# Patient Record
Sex: Female | Born: 1955 | Race: Black or African American | Hispanic: No | Smoking: Never smoker
Health system: Southern US, Community
[De-identification: ages and names within clinical notes are randomized; demographics above are authoritative.]

## PROBLEM LIST (undated history)

## (undated) DIAGNOSIS — D759 Disease of blood and blood-forming organs, unspecified: Secondary | ICD-10-CM

## (undated) DIAGNOSIS — E876 Hypokalemia: Secondary | ICD-10-CM

## (undated) DIAGNOSIS — Z8489 Family history of other specified conditions: Secondary | ICD-10-CM

## (undated) DIAGNOSIS — F329 Major depressive disorder, single episode, unspecified: Secondary | ICD-10-CM

## (undated) DIAGNOSIS — F0390 Unspecified dementia without behavioral disturbance: Secondary | ICD-10-CM

## (undated) DIAGNOSIS — E8881 Metabolic syndrome: Secondary | ICD-10-CM

## (undated) DIAGNOSIS — F32A Depression, unspecified: Secondary | ICD-10-CM

## (undated) DIAGNOSIS — M792 Neuralgia and neuritis, unspecified: Secondary | ICD-10-CM

## (undated) DIAGNOSIS — G35 Multiple sclerosis: Secondary | ICD-10-CM

## (undated) DIAGNOSIS — I10 Essential (primary) hypertension: Secondary | ICD-10-CM

## (undated) HISTORY — DX: Unspecified dementia, unspecified severity, without behavioral disturbance, psychotic disturbance, mood disturbance, and anxiety: F03.90

## (undated) HISTORY — PX: ABDOMINAL HYSTERECTOMY: SHX81

---

## 1997-12-23 ENCOUNTER — Ambulatory Visit (HOSPITAL_BASED_OUTPATIENT_CLINIC_OR_DEPARTMENT_OTHER): Admission: RE | Admit: 1997-12-23 | Discharge: 1997-12-23 | Payer: Self-pay | Admitting: Specialist

## 1999-04-29 ENCOUNTER — Encounter: Payer: Self-pay | Admitting: Family Medicine

## 1999-04-29 ENCOUNTER — Encounter: Admission: RE | Admit: 1999-04-29 | Discharge: 1999-04-29 | Payer: Self-pay | Admitting: Family Medicine

## 1999-05-08 ENCOUNTER — Encounter: Admission: RE | Admit: 1999-05-08 | Discharge: 1999-05-08 | Payer: Self-pay | Admitting: Family Medicine

## 1999-05-08 ENCOUNTER — Encounter: Payer: Self-pay | Admitting: Family Medicine

## 2000-05-09 ENCOUNTER — Encounter: Payer: Self-pay | Admitting: Emergency Medicine

## 2000-05-10 ENCOUNTER — Encounter: Payer: Self-pay | Admitting: Emergency Medicine

## 2000-05-10 ENCOUNTER — Observation Stay (HOSPITAL_COMMUNITY): Admission: EM | Admit: 2000-05-10 | Discharge: 2000-05-11 | Payer: Self-pay | Admitting: *Deleted

## 2001-06-19 ENCOUNTER — Encounter: Admission: RE | Admit: 2001-06-19 | Discharge: 2001-06-19 | Payer: Self-pay | Admitting: Orthopedic Surgery

## 2001-06-19 ENCOUNTER — Encounter: Payer: Self-pay | Admitting: Orthopedic Surgery

## 2001-07-10 ENCOUNTER — Encounter: Payer: Self-pay | Admitting: Orthopedic Surgery

## 2001-07-10 ENCOUNTER — Encounter: Admission: RE | Admit: 2001-07-10 | Discharge: 2001-07-10 | Payer: Self-pay | Admitting: Orthopedic Surgery

## 2001-07-24 ENCOUNTER — Encounter: Payer: Self-pay | Admitting: Orthopedic Surgery

## 2001-07-24 ENCOUNTER — Encounter: Admission: RE | Admit: 2001-07-24 | Discharge: 2001-07-24 | Payer: Self-pay | Admitting: Orthopedic Surgery

## 2004-06-15 ENCOUNTER — Ambulatory Visit (HOSPITAL_COMMUNITY): Admission: RE | Admit: 2004-06-15 | Discharge: 2004-06-15 | Payer: Self-pay | Admitting: Neurology

## 2006-03-22 ENCOUNTER — Encounter: Admission: RE | Admit: 2006-03-22 | Discharge: 2006-03-22 | Payer: Self-pay | Admitting: Family Medicine

## 2006-12-13 ENCOUNTER — Inpatient Hospital Stay (HOSPITAL_COMMUNITY): Admission: EM | Admit: 2006-12-13 | Discharge: 2006-12-17 | Payer: Self-pay | Admitting: *Deleted

## 2006-12-14 ENCOUNTER — Ambulatory Visit: Payer: Self-pay | Admitting: Physical Medicine & Rehabilitation

## 2006-12-17 ENCOUNTER — Inpatient Hospital Stay (HOSPITAL_COMMUNITY)
Admission: AD | Admit: 2006-12-17 | Discharge: 2006-12-30 | Payer: Self-pay | Admitting: Physical Medicine & Rehabilitation

## 2006-12-17 ENCOUNTER — Ambulatory Visit: Payer: Self-pay | Admitting: Physical Medicine & Rehabilitation

## 2009-01-21 ENCOUNTER — Encounter: Admission: RE | Admit: 2009-01-21 | Discharge: 2009-01-21 | Payer: Self-pay | Admitting: Internal Medicine

## 2009-06-29 ENCOUNTER — Emergency Department (HOSPITAL_COMMUNITY): Admission: EM | Admit: 2009-06-29 | Discharge: 2009-06-29 | Payer: Self-pay | Admitting: Emergency Medicine

## 2010-08-06 LAB — URINALYSIS, ROUTINE W REFLEX MICROSCOPIC
Bilirubin Urine: NEGATIVE
Glucose, UA: NEGATIVE mg/dL
Ketones, ur: NEGATIVE mg/dL
Protein, ur: NEGATIVE mg/dL

## 2010-09-25 ENCOUNTER — Other Ambulatory Visit: Payer: Self-pay | Admitting: Neurological Surgery

## 2010-09-25 DIAGNOSIS — M545 Low back pain: Secondary | ICD-10-CM

## 2010-09-29 NOTE — Procedures (Signed)
REFERRING PHYSICIAN:  Melvyn Novas, M.D.   DATE OF STUDY:  12/14/2006.   CLINICAL INFORMATION:  This is a 55 year old black female, who has a  history of multiple sclerosis and being evaluated for episodes of  feeling strange.   TECHNICAL DESCRIPTION:  This electroencephalogram was recorded and done  during the awake state.  The background activity shows 8 to 9 Hertz  rhythms, her amplitude is in the posterior head regions bilaterally.  Hyperventilation testing was not performed.  The photic stimulation was  performed, which did not produce evidence of a driving response.  There  was no stage II sleep, and there was no focal asymmetry, and there was  no epileptiform activity present during this study.   IMPRESSION:  This is a normal electroencephalogram during the awake  state.           ______________________________  Genene Churn. Sandria Manly, M.D.     ZOX:WRUE  D:  12/14/2006 16:03:22  T:  12/15/2006 10:36:03  Job #:  [id number]   cc:   Melvyn Novas, M.D.  Fax: [phone]

## 2010-09-29 NOTE — Discharge Summary (Signed)
[name], OGARRO                ACCOUNT NO.:  [id number]   MEDICAL RECORD NO.:  [id number]          PATIENT TYPE:  INP   LOCATION:  5015                         FACILITY:  [facility]   PHYSICIAN:  Melvyn Novas, M.D.  DATE OF BIRTH:  November 23, 1955   DATE OF ADMISSION:  12/13/2006  DATE OF DISCHARGE:                               DISCHARGE SUMMARY   HISTORY:  Sierah Lacewell is a 55 year old African-American right-handed  female with a 13 year history of multiple sclerosis.  She also has a  past medical history of cocaine abuse.  The patient presented on  12/13/2006 to the emergency room claiming that she was no longer able to  ambulate.    She also confessed that she has neither taken her Phenytek, a seizure  medication that she had taken in the past for a seizure disorder nor had  she taken her Avonex shots for her MS treatment.     Again the patient tested positive on this admission for cocaine by  urine toxicology screen.   Her vital signs were normal.  She had a temperature of 98 degrees  Fahrenheit.  Today she has a blood pressure of 137/93 and saturates  oxygen at 97% on room air.  She has not regained any antigravity  movement in her initially flaccid left leg.  She has neither regained  full strength back in her left arm which has a pronator drift and a  decreased grip strength.  Deep tendon reflexes have not returned to the  left body.  Sensory is not affected according to the patient when tested  for primary modalities.  There was no cranial nerve abnormality noticed.   The patient had an MRI of the brain and C-spine.  Both MRI studies  showed multiple white matter lesions, but none was enhancing and there  was no diffusion positive lesion.  Therefore the patient tested negative  for stroke.  She received three days of Solu-Medrol 1 gram each date.  Her white blood cell count was elevated to 29,000 and the patient's  white blood cell count is today rechecked with differential  to make sure  that we are dealing with demargination and not with an infection.  Her physical therapist has recommended evaluation for an inpatient rehab  and this dictation is meant to facilitate her discharge to the rehab  floor.   CURRENT MEDICATIONS:  1. The patient completed her three days of IV steroids.  2. She is still taking Lovenox 40 mg q.24h. shots for DVT prophylaxis.  3. She takes calcium carbonate with vitamin D.  4. Protonix.  5. She received Bactrim DS b.i.d. for urinary tract infection      suspected at admission due to a very dark urine that was strong      smelling.  The patient's urinalysis did not show a positive UTI and      the Bactrim was therefore discontinued today.   PLAN:  The patient will continue with an oral taper of steroids and to  restart her Avonex.  Since she had no recurrence of seizures since discontinuation of  Phenytek in February, I felt it was not necessary to restart her on the  [facility].  She will transfer  to in-hospital rehabilitation on Saturday morning.  She will follow up outpatient with Dr. Orlin Hilding, [phone], [facility].   The patient had a normal electroencephalogram read by my colleague, Dr.  Avie Echevaria on 12/14/2006.      Melvyn Novas, M.D.  Electronically Signed     CD/MEDQ  D:  12/15/2006  T:  12/15/2006  Job:  [id number]   cc:   Santina Evans A. Orlin Hilding, M.D.

## 2010-09-29 NOTE — H&P (Signed)
NAMENIAMH, RADA NO.:  [id number]   MEDICAL RECORD NO.:  [id number]          PATIENT TYPE:  INP   LOCATION:  5015                         FACILITY:  [facility]   PHYSICIAN:  Melvyn Novas, M.D.  DATE OF BIRTH:  1955-07-26   DATE OF ADMISSION:  12/13/2006  DATE OF DISCHARGE:                              HISTORY & PHYSICAL   The patient is a 55 year old African American right-handed female  admitted through the [facility] emergency from the care of Dr. Mariel Aloe.   CHIEF COMPLAINT:  I cannot walk.Marland Kitchen   HISTORY OF PRESENT ILLNESS:  This is a 56 year old female who carries a  diagnosis of multiple sclerosis for over 13 years and had tried multiple  injectable medications.  She states that in February she chose to no  longer use Avonex injections which she had to take once a week  intramuscularly.  She also states that she has a seizure disorder, but  stopped taking Phenytek her medication at the same time.  This is not  the first time the patient has chosen to take a drug holiday.  She had  no recurrent seizures, but she has now for over 10 days worsening of  lower extremity weakness, left more than right, and today was not even  able to lift her left leg antigravity any more.  Since it is not safe  for her to live at home without the ability to ambulate, her niece  brought her to the emergency room.  Prior to the even, she has had done  most of her activities of daily living by herself.  The patient had no  associated pain.  No associated fever or chills.  She has noted  worsening of neurogenic bladder symptoms.  She is no longer able to  determine when her bladder is full and then develops urge incontinence  and often has accidents.  She had no stool incontinence.  She denies any  vision changes or sensory changes.   ALLERGIES:  The patient dd knows of no drug allergies.   CURRENT MEDICATIONS:  Should be Avonex and Phenytek, none of which she  has taken at  home.   SOCIAL HISTORY:  The patient lives with her mother.  She is divorced.  She is receiving disability payments.  She is a nonsmoker and  nondrinker, but in the past had experimented with cocaine she states.  According to her niece, she still has a live-in arrangement with her  estranged/divorced husband.  The niece says that he, however, does not  help in the household.   FAMILY HISTORY:  There is no history of MS, lupus, rheumatoid arthritis  of other autoimmune disease known.   REVIEW OF SYSTEMS:  As above.   PHYSICAL EXAMINATION:  VITAL SIGNS:  Temperature 98.6 degrees  Fahrenheit.  Pulse rate of 75.  Blood pressure of 120/85.  Respiratory  rate of 16-18 at rest with 100% saturation on room air.  GENERAL APPEARANCE:  The patient is awake.  She is pleasant and in no  acute distress.  LUNGS:  Her lungs were clear  to auscultation.  COR:  Regular rate and rhythm.  No murmur.  No carotid artery bruits.  No neck vein distension.  EXTREMITIES:  There is no edema.  I can feel all peripheral pulses.  ABDOMEN:  The patient has normal bowel sounds.  There is no flank pain.  There is a distended abdomen suprapubically and I do believe she has  urinary retention.  She also does not complain of any discomfort when  palpated.  MENTAL STATUS:  The patient is alert and oriented x3.  She has no  ataxia, apraxia, dysarthria or dysphagia.  Cranial nerve examination  shows pupils that respond to light equally prompt.  She had normal  extraocular movements.  I did not see facial asymmetry.  She could close  her eyes.  She could provide me a grin.  Tongue and uvula were midline.  She had no facial sensory loss.  MOTOR EXAMINATION:  The upper extremities have antigravity movement.  There is a very subtle pronator drift on the left side, but in the  finger-nose test there is no significant dysmetria.  The patient could  not perform a heel-to-shin test with her left nor with her right leg.  She  provides grip strength bilaterally, but left is weaker than right.  She had deep tendon reflexes that appear brisk of left and right.  She  has an upgoing toe on the left.  Cerebellar testing was, otherwise,  intact.  Sensory to fine touch was intact.  She could not provide any  antigravity movements with her left lower extremity and did not  withdrawal to painful stimuli.   LABORATORY DATA:  White blood cell count 6.3, hemoglobin and hematocrit  12.2 and 39, platelet count 250,000.  Potassium was slightly lower than  average at 3.3.  Sodium was 137 and glucose 111.  The patient had a low  creatinine level of 0.90 and a calcium level of 8.9.  CT shows atrophy  and chronic microvascular changes which also could be a sign of white  matter lesions in relation to MS.   ASSESSMENT AND PLAN:  Multiple sclerosis exacerbation.  Sensory cranial  nerve involvement seems to be present.  I will evaluate the brain and  cervical spine for possible involvement.  Her worsening of the urinary  retention and the predominantly lower extremity weakness could also  result from a thoracic spine demyelinating lesion.  An magnetic  resonance imaging necessary was ordered with and without contrast.  In  addition, I have ordered for the patient to have a physical therapy,  occupational therapy and rehabilitation evaluation.  I have ordered  steroids intravenously, 1 gram of Solu-Medrol will be given today and  then every 20 hours.  The patient, who never had any allergic reactions  to Avonex, should normally start back on the medication that she  tolerated well.  Since she has given herself the weekly intramuscular  shot, she must have had a good degree of dexterity.  I would like to  consider for the patient to start a subcutaneous injection if she  prefers this with a prefilled syringe and an injection pen, so that the  dexterity should not be a problem and she might be able to alternate her  injection sites a  little better.  However, she appears at this time not  to be interested in restarting medications.  I have not further  considered to put the patient back on phenytoin since she has been  seizure free in  spite of her discontinuation of antiepileptic  medications as of 06/2006.  I explained to her and her niece that some  of her deficits might be permanent in that the hope for a gradual  recovery.      Melvyn Novas, M.D.  Electronically Signed     CD/MEDQ  D:  12/13/2006  T:  12/14/2006  Job:  [id number]   cc:   Santina Evans A. Orlin Hilding, M.D.  [facility]  Arsenio Loader, MD

## 2010-10-01 ENCOUNTER — Ambulatory Visit
Admission: RE | Admit: 2010-10-01 | Discharge: 2010-10-01 | Disposition: A | Discharge status: Home / Self Care | Payer: Medicare Other | Source: Ambulatory Visit | Attending: Neurological Surgery | Admitting: Neurological Surgery

## 2010-10-01 DIAGNOSIS — M545 Low back pain: Secondary | ICD-10-CM

## 2010-10-02 NOTE — Discharge Summary (Signed)
[name], [name]                ACCOUNT NO.:  [id number]   MEDICAL RECORD NO.:  [id number]          PATIENT TYPE:  IPS   LOCATION:  4037                         FACILITY:  [facility]   PHYSICIAN:  Greg Cutter, P.A. DATE OF BIRTH:  Aug 13, 1955   DATE OF ADMISSION:  12/17/2006  DATE OF DISCHARGE:  12/30/2006                               DISCHARGE SUMMARY   DISCHARGE DIAGNOSES:  1. Multiple sclerosis with recent exacerbation.  2. Right lower lobe pneumonia.  3. History of polysubstance abuse.  4. Hypertension.   HISTORY OF PRESENT ILLNESS:  [name] is a 55 year old female with  history of MS and medical noncompliance.  Admitted July 29 with increase  in left lower extremity weakness and decreased mobility x4 days.  CT of  the head showed no atrophy, and chronic small-vessel disease.  MRI of  brain showed extensive changers due to MS, with generalized atrophy.  MRI C-spine shows numerous MS lesions __________ , medullary and  cervical cord.  No enhancing lesions noted.  The patient was treated  with IV steroids by Neurology.  UDS was done and was positive for  cocaine.  Currently patient continues with weakness, left thigh and left  upper greater than left lower, with ataxia left upper greater than right  upper extremity.  Therapy was initiated, and patient was noted to have  problems with poor __________  awareness.  Requires total assist for  hygiene, total assist or transfers.  Rehab consulted for further  therapy.  Also note patient does have a history of seizure disorder, and  EEG done prior to admission showed no acute changes.  Dilantin not  resumed per Neurology __________ .   PAST MEDICAL HISTORY:  Significant for MS x13 years, seizure disorder,  cocaine abuse, hysterectomy, partial thyroidectomy, history of breast  reduction in 1999, __________ .   ALLERGIES:  NO KNOWN DRUG ALLERGIES.   FAMILY HISTORY:  Positive for coronary artery disease.   SOCIAL HISTORY:   Patient lives alone.  Does have a boyfriend on and off.  Lives in a one-level home with a ramp entry.  Friends help with  housework and meals.  Does not use any alcohol.  Has a history of  cocaine abuse.   HOSPITAL COURSE:  [name] was admitted to rehab on December 17, 2006.  Patient therapy to consist of PT/OT daily.  On past admission  voiding was monitored with periodic checks.  The patient was noted to  have incontinent episode with high volumes initially.  With improvement  in mobility patient's PVRs were much improved, with volumes at 3200 mL  by the time of discharge.  Patient was noted to have hypoglycemia  secondary to steroids, and CBGs were monitored during this stay.  Blood  sugars at the time of discharge ranged from 99 to 140s.  Blood pressure  was checked on a b.i.d. basis and ranged from 90 to 120 systolic and 60s  diastolic.  Labs done last admission revealed hemoglobin 14.9,  hematocrit 44.9, white count 19.1, platelets 216, __________  sodium  139, potassium 3.8, chloride 100,  CO2 of 29, BUN 17, creatinine 0.99,  glucose 109.  LFTs were within normal limits, except for a low albumin  at 3.0.  Patient was noted to have a temp spike of 103 on December 25, 2006.  She was pancultured.  Blood cultures x2 and urine cultures were  done, and these were all negative, showing no growth.  Chest x-ray  revealed right lower lobe atelectasis with an infiltrate, and patient  was started on Cipro for pneumonia.  She is to continue on antibiotics  for 10 total days of therapy.  Followup CBC of December 25, 2006 showed  improvement in leukocytosis, with a white count of 12.5.   Therapy initiated and have focused on balance, as well as bilateral  lower extremity strengthening and lower extremity  control.  At the time  of discharge patient was at supervision.  Will __________  mobility.  Able to perform sliding-board transfers with min queuing for hand  placement, and she was able  to perform __________  transfers from  wheelchair to mattress with supervision.  She required mid assist to  stand.  Able to maintain balance for 3 seconds to 1 minute.  OT has  worked with  ADL retraining and __________ , with focus on safety for  transfers and for toileting.  They also worked with patient on  increasing upper extremity strength and endurance.  Safety has been a  focus, with patient encouraged to work on attention with standing and  tasks.  Patient is at mid assist overall with __________  for safety for  self-care.  Twenty-four hour supervision care is recommended past  discharge.  Patient's friend to provide  assistance past discharge.  Patient will continue with further progressive home health, PT, OT by  advanced home care past discharge.  On December 30, 2006 patient is  discharged to home.   DISCHARGE MEDICATIONS:  1. Norvasc 5 mg p.o. per day.  2. Cipro 250 mg b.i.d. for 5 additional days.  3. Os-Cal 50 b.i.d.  4. Multivitamin 1 per day.  5. Senokot-S 2 p.o. nightly.  6. Avonex 30 mcg subcu q. Friday.   DIET:  Regular.   ACTIVITY LEVEL:  Activity is 24-hour supervision assistance.  Transfer  with assistance.   FOLLOWUP:  Patient to follow up with Dr. Orlin Hilding for recheck in 2 weeks.  Follow up with Dr. Lamar Benes as needed.      Greg Cutter, P.A.     PP/MEDQ  D:  01/02/2007  T:  01/02/2007  Job:  [id number]   cc:   Dr. Baird Kay A. Orlin Hilding, M.D.

## 2010-10-02 NOTE — Discharge Summary (Signed)
[place]  Patient:    [name], [name]                       MRN: [id number] Adm. Date:  [id number] Disc. Date: [id number] Attending:  Arsenio Loader Dictator:   Doreatha Martin, M.D.                           Discharge Summary  DISCHARGE DIAGNOSES: 1. 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 2. Multiple sclerosis. 3. Cocaine abuse. 4. Hypothyroidism. 5. Status post hysterectomy. 6. Status post thyroid surgery in 1980. 7. Status post breast reduction surgery in 1999.  DISCHARGE MEDICATIONS:  Synthroid 0.75 mg p.o. q.d.  NEUROLOGIST:  Gustavus Messing. Orlin Hilding, M.D.  HISTORY OF PRESENT ILLNESS:  [name] is a 55 year old African-American female, who has a history of multiple sclerosis and seizures.  She presents by EMS to the emergency room after having suffered two seizures over the previous night.  At the time of injury, the patient was very somnolent.  Therefore, most of the history was obtained per her husband.  He states that around 11:00 the previous evening, they were arriving at home and getting out of the vehicle.  He states that he went into the home and came back out because his wife was not coming inside.  When he went outside to find her, she was actively seizing.  He states that she was standing and that had her right arm contracted towards her face and was shaking.  The patient did not lose consciousness and remained standing.  The patient was even able to speak during the seizure.  Her husband states that the seizure lasted a long time, but he does not know how long or how many minutes.  EMS was called, and at that time, the patient suffered a second seizure.  The patient was brought to the emergency room.  The second seizure was witnessed by EMS, and they stated that it lasted approximately one minute.  She was in normal sinus rhythm with ventricular response of 92 beats per minute at the time of the seizure.   The patient was brought to the emergency room and was loaded with 1 gram of Dilantin.  The medical teaching service was then called because the patient became very, very lethargic after the Dilantin was given.  The patient denied any headaches.  She denied any nausea, vomiting, diarrhea.  ALLERGIES:  No known drug allergies.  SOCIAL HISTORY:  The patient lives with her husband in [place] and has a 62 year old child.  The patient is disabled secondary to her multiple sclerosis.  She is ambulatory and performs all activities of daily living.  FAMILY HISTORY:  Positive for a father with an acute MI at the age of 22.  PHYSICAL EXAMINATION:  VITAL SIGNS:  On admission, temperature 98.6, blood pressure 142/97, pulse 78, respiratory rate 20, oxygen saturations 99% on 3 liters.  GENERAL:  The patient is lethargic but oriented x 3.  HEENT:  PERRLA.  The patient wears upper dentures.  There are no oropharyngeal lesions.  NECK:  Supple.  No JVD.  No lymphadenopathy.  LUNGS:  Clear to auscultation bilaterally.  CARDIOVASCULAR:  Regular rate and rhythm, S1, S2, no murmur, rub or gallop.  ABDOMEN:  Soft, diffusely tender with decreased bowel sounds, and it is nondistended.  EXTREMITIES:  No edema.  Pedal pulses 2+ bilaterally.  NEUROLOGIC:  Cranial nerves 2-12 grossly  intact.  Motor 5/5 in all muscle groups.  No sensory deficit.  LABORATORY DATA ON ADMISSION:  Sodium 144, potassium 3.4, chloride 107, bicarb 29, BUN 9, creatinine 1.1, glucose 98, hemoglobin 11.0.  CT of the head did not show any acute process or fracture.  It did show a soft tissue hematoma. Alcohol level less than 10.  Urine drug screen was positive for cocaine, barbituates, and marijuana.  HOSPITAL COURSE: #1 - SEIZURE DISORDER:  The patient was admitted because her response to Dilantin was one of decreased sensorium an needed to be under observation. She did not suffer any other seizures after admission.  The  patients seizure disorder is secondary to lesions in her brain from her multiple sclerosis. The patient states that she used to be on Dilantin approximately one year ago, but that she was taken off of it for unknown reasons.  She does state that even when was taking the Dilantin, she would become very sleepy and groggy while on the medication.  The patient denies having had seizure activity for at least a year previous to admission.  I spoke with Mrs. Morrissette neurologist, Dr. Orlin Hilding, who agreed to see her as an outpatient.  The patient is to call Dr. Galvin Proffer office and set up an appointment.  It is very possible that the patients use of illicit drugs potentiated her protensity to develop a seizure.  At the time of discharge, the patient was very awake and alert.  #2 - MULTIPLE SCLEROSIS:  The patient has been diagnosed with this disorder for the past six years.  The patient is ambulatory.  She states that she was taking Avonex every week until approximately one month ago, at which time she was not able to afford the medication.  The patient is to follow up with Dr. Orlin Hilding.  #3 - HYPOTHYROIDISM:  The patient is on Synthroid 75 mcg daily.  Her TSH was within normal limits.  No issues during this hospitalization.  #4 - ABDOMINAL PAIN:  The patient complained of abdominal pain while she was in the emergency room.  Her liver functions were within normal limits, and an acute GI series did not show any obstruction or free air.  Her abdominal pain had resolved by the next morning.  #6 - LEUKOCYTOSIS:  This was resolved by the time the patient was discharged. It was probably secondary to demargination from her seizure activity. DD:  05/15/00 TD:  05/15/00 Job: [id number]

## 2011-02-24 ENCOUNTER — Other Ambulatory Visit: Payer: Self-pay | Admitting: Geriatric Medicine

## 2011-02-24 DIAGNOSIS — Z1231 Encounter for screening mammogram for malignant neoplasm of breast: Secondary | ICD-10-CM

## 2011-03-01 LAB — COMPREHENSIVE METABOLIC PANEL
AST: 23
AST: 13
Albumin: 3 — ABNORMAL LOW
Albumin: 3 — ABNORMAL LOW
Alkaline Phosphatase: 63
BUN: 15
BUN: 9
CO2: 26
Calcium: 8.9
Chloride: 100
Chloride: 111
Chloride: 108
Creatinine, Ser: 1.04
Creatinine, Ser: 0.93
GFR calc Af Amer: >60
GFR calc Af Amer: >60
GFR calc non Af Amer: 56 — ABNORMAL LOW
GFR calc non Af Amer: >60
Glucose, Bld: 167 — ABNORMAL HIGH
Potassium: 3.8
Total Bilirubin: 0.2 — ABNORMAL LOW
Total Bilirubin: 0.4
Total Bilirubin: 0.3
Total Protein: 6.9

## 2011-03-01 LAB — URINE MICROSCOPIC-ADD ON

## 2011-03-01 LAB — DIFFERENTIAL
Basophils Absolute: 0
Basophils Absolute: 0
Eosinophils Absolute: 0
Eosinophils Relative: 0
Eosinophils Relative: 1
Eosinophils Relative: 0
Lymphocytes Relative: 19
Lymphocytes Relative: 7 — ABNORMAL LOW
Lymphocytes Relative: 19
Lymphs Abs: 0.9
Lymphs Abs: 0.8
Lymphs Abs: 1.2
Monocytes Absolute: 1.1 — ABNORMAL HIGH
Monocytes Absolute: 1.7 — ABNORMAL HIGH
Monocytes Relative: 9
Monocytes Relative: 9
Monocytes Relative: 2 — ABNORMAL LOW
Neutro Abs: 13.6 — ABNORMAL HIGH
Neutro Abs: 26.3 — ABNORMAL HIGH
Neutro Abs: 4.4
Neutrophils Relative %: 71

## 2011-03-01 LAB — CBC
HCT: 42.3
HCT: 36.7
HCT: 36.8
HCT: 38.2
Hemoglobin: 14.1
Hemoglobin: 12.4
MCHC: 33.2
MCV: 78
MCV: 78.4
MCV: 79.5
Platelets: 216
Platelets: 227
Platelets: 248
RBC: 5.42 — ABNORMAL HIGH
RBC: 4.69
RBC: 4.69
RDW: 14.4 — ABNORMAL HIGH
WBC: 12.5 — ABNORMAL HIGH
WBC: 19.1 — ABNORMAL HIGH
WBC: 27.7 — ABNORMAL HIGH
WBC: 29.7 — ABNORMAL HIGH
WBC: 6.3

## 2011-03-01 LAB — URINALYSIS, ROUTINE W REFLEX MICROSCOPIC
Bilirubin Urine: NEGATIVE
Glucose, UA: NEGATIVE
Glucose, UA: 500 — AB
Ketones, ur: NEGATIVE
Nitrite: NEGATIVE
Specific Gravity, Urine: 1.012
Specific Gravity, Urine: 1.023
pH: 7.5
pH: 6

## 2011-03-01 LAB — URINALYSIS, MICROSCOPIC ONLY
Bilirubin Urine: NEGATIVE
Hgb urine dipstick: NEGATIVE
Ketones, ur: NEGATIVE
Nitrite: NEGATIVE
Specific Gravity, Urine: 1.013
pH: 8

## 2011-03-01 LAB — URINE CULTURE
Colony Count: NO GROWTH
Colony Count: NO GROWTH
Culture: NO GROWTH
Culture: NO GROWTH
Special Requests: POSITIVE

## 2011-03-01 LAB — HEMOGLOBIN A1C
Hgb A1c MFr Bld: 5.8
Mean Plasma Glucose: 129

## 2011-03-01 LAB — CULTURE, BLOOD (ROUTINE X 2)

## 2011-03-01 LAB — BASIC METABOLIC PANEL
BUN: 9
Creatinine, Ser: 0.94
GFR calc Af Amer: >60
GFR calc non Af Amer: >60
Potassium: 3.5

## 2011-03-01 LAB — RAPID URINE DRUG SCREEN, HOSP PERFORMED
Amphetamines: NOT DETECTED
Opiates: NOT DETECTED
Tetrahydrocannabinol: NOT DETECTED

## 2011-03-24 ENCOUNTER — Ambulatory Visit: Payer: Medicare Other

## 2011-04-08 ENCOUNTER — Emergency Department (HOSPITAL_COMMUNITY): Payer: Medicare Other

## 2011-04-08 ENCOUNTER — Encounter: Payer: Self-pay | Admitting: Emergency Medicine

## 2011-04-08 ENCOUNTER — Emergency Department (HOSPITAL_COMMUNITY)
Admission: EM | Admit: 2011-04-08 | Discharge: 2011-04-08 | Disposition: A | Discharge status: Skilled Nursing Facility | Payer: Medicare Other | Attending: Emergency Medicine | Admitting: Emergency Medicine

## 2011-04-08 ENCOUNTER — Other Ambulatory Visit: Payer: Self-pay

## 2011-04-08 DIAGNOSIS — E119 Type 2 diabetes mellitus without complications: Secondary | ICD-10-CM | POA: Insufficient documentation

## 2011-04-08 DIAGNOSIS — R079 Chest pain, unspecified: Secondary | ICD-10-CM | POA: Insufficient documentation

## 2011-04-08 DIAGNOSIS — F329 Major depressive disorder, single episode, unspecified: Secondary | ICD-10-CM | POA: Insufficient documentation

## 2011-04-08 DIAGNOSIS — F3289 Other specified depressive episodes: Secondary | ICD-10-CM | POA: Insufficient documentation

## 2011-04-08 DIAGNOSIS — G35 Multiple sclerosis: Secondary | ICD-10-CM | POA: Insufficient documentation

## 2011-04-08 DIAGNOSIS — I10 Essential (primary) hypertension: Secondary | ICD-10-CM | POA: Insufficient documentation

## 2011-04-08 HISTORY — DX: Depression, unspecified: F32.A

## 2011-04-08 HISTORY — DX: Multiple sclerosis: G35

## 2011-04-08 HISTORY — DX: Hypokalemia: E87.6

## 2011-04-08 HISTORY — DX: Metabolic syndrome: E88.81

## 2011-04-08 HISTORY — DX: Neuralgia and neuritis, unspecified: M79.2

## 2011-04-08 HISTORY — DX: Essential (primary) hypertension: I10

## 2011-04-08 HISTORY — DX: Metabolic syndrome: E88.810

## 2011-04-08 HISTORY — DX: Major depressive disorder, single episode, unspecified: F32.9

## 2011-04-08 LAB — POCT I-STAT, CHEM 8
Calcium, Ion: 1.19 mmol/L (ref 1.12–1.32)
Chloride: 103 mEq/L (ref 96–112)
Glucose, Bld: 115 mg/dL — ABNORMAL HIGH (ref 70–99)
HCT: 36 % (ref 36.0–46.0)
Hemoglobin: 12.2 g/dL (ref 12.0–15.0)

## 2011-04-08 LAB — CBC
HCT: 33.7 % — ABNORMAL LOW (ref 36.0–46.0)
Hemoglobin: 10.7 g/dL — ABNORMAL LOW (ref 12.0–15.0)
MCH: 24.5 pg — ABNORMAL LOW (ref 26.0–34.0)
MCHC: 31.8 g/dL (ref 30.0–36.0)
MCV: 77.3 fL — ABNORMAL LOW (ref 78.0–100.0)

## 2011-04-08 LAB — DIFFERENTIAL
Basophils Relative: 1 % (ref 0–1)
Eosinophils Absolute: 0.2 10*3/uL (ref 0.0–0.7)
Eosinophils Relative: 2 % (ref 0–5)
Monocytes Absolute: 0.8 10*3/uL (ref 0.1–1.0)
Monocytes Relative: 13 % — ABNORMAL HIGH (ref 3–12)

## 2011-04-08 MED ORDER — POTASSIUM CHLORIDE CRYS ER 20 MEQ PO TBCR
40.0000 meq | EXTENDED_RELEASE_TABLET | Freq: Once | ORAL | Status: AC
  Administered 2011-04-08: 40 meq via ORAL

## 2011-04-08 MED ORDER — POTASSIUM CHLORIDE CRYS ER 20 MEQ PO TBCR
EXTENDED_RELEASE_TABLET | ORAL | Status: AC
  Filled 2011-04-08: qty 2

## 2011-04-08 MED ORDER — POTASSIUM CHLORIDE 20 MEQ/15ML (10%) PO LIQD
40.0000 meq | Freq: Once | ORAL | Status: DC
  Filled 2011-04-08: qty 30

## 2011-04-08 NOTE — ED Provider Notes (Signed)
History    patient with history of MS, hypertension, and diabetes is present to the ED with chief complaint of chest pain. Patient states she was on her wheelchair, strolling through the hallway when she experiencing any sharp sensation to her mid chest. She describes pain as constant, lasting for nearly an hour, and then resolve completely. She denies fever, headache, shortness of breath, nausea, vomiting, diarrhea, diaphoresis, dysuria. She denies pain with movement. She was given 325mg  aspirin at the nursing home but does not know whether that alleviated her pain.  She is currently in no acute distress, she denies any history of cardiac etiology except for hypertension. She denies recent trauma, or rash.  CSN: [id number] Arrival date & time: 04/08/2011  8:33 PM   First MD Initiated Contact with Patient 04/08/11 2040      Chief Complaint  Patient presents with  . Chest Pain    (Consider location/radiation/quality/duration/timing/severity/associated sxs/prior treatment) HPI  Past Medical History  Diagnosis Date  . Allergic rhinitis   . Diabetes mellitus   . Hypertension   . Hypokalemia   . Depression   . Multiple sclerosis   . Vitamin D deficiency   . Dysmetabolic syndrome X   . Urinary incontinence   . Neuropathic pain     Past Surgical History  Procedure Date  . Cesarean section     No family history on file.  History  Substance Use Topics  . Smoking status: Never Smoker   . Smokeless tobacco: Never Used  . Alcohol Use: No    OB History    Grav Para Term Preterm Abortions TAB SAB Ect Mult Living                  Review of Systems  All other systems reviewed and are negative.    Allergies  Review of patient's allergies indicates no known allergies.  Home Medications  No current outpatient prescriptions on file.  BP 134/81  Pulse 96  Resp 19  Ht 5\' 5"  (1.651 m)  Wt 136 lb (61.689 kg)  BMI 22.63 kg/m2  SpO2 99%  Physical Exam  Nursing note and  vitals reviewed. Constitutional:       Awake, alert, nontoxic appearance  HENT:  Head: Atraumatic.  Eyes: Right eye exhibits no discharge. Left eye exhibits no discharge.  Neck: Neck supple.  Cardiovascular: Normal rate and regular rhythm.  Exam reveals no gallop and no friction rub.   No murmur heard. Pulmonary/Chest: Effort normal and breath sounds normal. She exhibits no tenderness.  Abdominal: There is no tenderness. There is no rebound.  Musculoskeletal: She exhibits no tenderness.  Neurological:       Mental status and motor strength appears baseline for patient and situation  Skin: No rash noted.  Psychiatric: She has a normal mood and affect.    ED Course  Procedures (including critical care time)  Labs Reviewed - No data to display No results found.   No diagnosis found.   Date: 04/08/2011  Rate: 97  Rhythm: normal sinus rhythm  QRS Axis: left  Intervals: normal  ST/T Wave abnormalities: normal  Conduction Disutrbances:none  Narrative Interpretation:   Old EKG Reviewed: unchanged    MDM  Patient has resolving chest pain. Pain does not sounds cardiac related. However, she does have a history of hypertension history of diabetes. Chest pain workup initiated.   10:08 PM chest pain workup was fairly unremarkable.  Patient has a normal EKG, chest x-ray is unremarkable, CBC denies status  shows no acute finding except for low potassium which had been given supplementation.  My attending has seen and evaluate patient, and felt pt is stable enough to be discharged.  My attending also agrees that her pain is noncardiac as it travels from chest to leg, and then resolves.       Fayrene Helper, PA 04/08/11 2220

## 2011-04-08 NOTE — ED Notes (Signed)
[facility] called 

## 2011-04-08 NOTE — ED Notes (Signed)
Called [facility] to Transport to [facility]. [facility]

## 2011-04-08 NOTE — ED Notes (Signed)
Per EMS; from [facility], c/o chest discomfort started an 1.5 hour ago when moving in wheelchair, highest pain level 2/10, sharp, midsternal pain; no SOB, n/v/d/ diaphoresis; by time pt in ambulance, CP resolved; pt received 325mg  ASA at nursing home; 20g LAC; vss

## 2011-04-09 NOTE — ED Provider Notes (Signed)
Medical screening examination/treatment/procedure(s) were conducted as a shared visit with non-physician practitioner(s) and myself.  I personally evaluated the patient during the encounter.  The patient arrived pain free with normal examination.  EKG was okay and enzymes were negative.  Symptoms are atypical for cardiac pain.  Will discharge with follow as needed.  Geoffery Lyons, MD 04/09/11 [phone]

## 2011-04-27 ENCOUNTER — Emergency Department (HOSPITAL_COMMUNITY): Payer: Medicare Other

## 2011-04-27 ENCOUNTER — Encounter (HOSPITAL_COMMUNITY): Payer: Self-pay | Admitting: Emergency Medicine

## 2011-04-27 ENCOUNTER — Emergency Department (HOSPITAL_COMMUNITY)
Admission: EM | Admit: 2011-04-27 | Discharge: 2011-04-28 | Disposition: A | Discharge status: Home / Self Care | Payer: Medicare Other | Attending: Emergency Medicine | Admitting: Emergency Medicine

## 2011-04-27 DIAGNOSIS — J069 Acute upper respiratory infection, unspecified: Secondary | ICD-10-CM | POA: Insufficient documentation

## 2011-04-27 DIAGNOSIS — R05 Cough: Secondary | ICD-10-CM | POA: Insufficient documentation

## 2011-04-27 DIAGNOSIS — R6889 Other general symptoms and signs: Secondary | ICD-10-CM | POA: Insufficient documentation

## 2011-04-27 DIAGNOSIS — R000 Tachycardia, unspecified: Secondary | ICD-10-CM | POA: Insufficient documentation

## 2011-04-27 DIAGNOSIS — R599 Enlarged lymph nodes, unspecified: Secondary | ICD-10-CM | POA: Insufficient documentation

## 2011-04-27 DIAGNOSIS — R112 Nausea with vomiting, unspecified: Secondary | ICD-10-CM | POA: Insufficient documentation

## 2011-04-27 DIAGNOSIS — G35 Multiple sclerosis: Secondary | ICD-10-CM | POA: Insufficient documentation

## 2011-04-27 DIAGNOSIS — R197 Diarrhea, unspecified: Secondary | ICD-10-CM | POA: Insufficient documentation

## 2011-04-27 DIAGNOSIS — I10 Essential (primary) hypertension: Secondary | ICD-10-CM | POA: Insufficient documentation

## 2011-04-27 DIAGNOSIS — R059 Cough, unspecified: Secondary | ICD-10-CM | POA: Insufficient documentation

## 2011-04-27 DIAGNOSIS — E119 Type 2 diabetes mellitus without complications: Secondary | ICD-10-CM | POA: Insufficient documentation

## 2011-04-27 DIAGNOSIS — E860 Dehydration: Secondary | ICD-10-CM | POA: Insufficient documentation

## 2011-04-27 DIAGNOSIS — R509 Fever, unspecified: Secondary | ICD-10-CM | POA: Insufficient documentation

## 2011-04-27 MED ORDER — ACETAMINOPHEN 325 MG PO TABS
650.0000 mg | ORAL_TABLET | Freq: Once | ORAL | Status: AC
  Administered 2011-04-27: 650 mg via ORAL
  Filled 2011-04-27: qty 2

## 2011-04-27 MED ORDER — SODIUM CHLORIDE 0.9 % IV SOLN
INTRAVENOUS | Status: DC
  Administered 2011-04-28: 01:00:00 via INTRAVENOUS

## 2011-04-27 MED ORDER — HYDROCODONE-ACETAMINOPHEN 5-325 MG PO TABS: 2.0000 | ORAL_TABLET | ORAL | Status: AC | PRN

## 2011-04-27 MED ORDER — MORPHINE SULFATE 4 MG/ML IJ SOLN: 4.0000 mg | Freq: Once | INTRAMUSCULAR | Status: DC

## 2011-04-27 MED ORDER — SODIUM CHLORIDE 0.9 % IV BOLUS (SEPSIS)
1000.0000 mL | Freq: Once | INTRAVENOUS | Status: AC
  Administered 2011-04-27: 1000 mL via INTRAVENOUS

## 2011-04-27 MED ORDER — KETOROLAC TROMETHAMINE 30 MG/ML IJ SOLN
30.0000 mg | Freq: Once | INTRAMUSCULAR | Status: AC
  Administered 2011-04-27: 30 mg via INTRAVENOUS
  Filled 2011-04-27: qty 1

## 2011-04-27 MED ORDER — ONDANSETRON HCL 8 MG PO TABS: 8.0000 mg | ORAL_TABLET | Freq: Three times a day (TID) | ORAL | Status: AC | PRN

## 2011-04-27 MED ORDER — ONDANSETRON HCL 4 MG/2ML IJ SOLN
4.0000 mg | Freq: Once | INTRAMUSCULAR | Status: AC
  Administered 2011-04-27: 4 mg via INTRAVENOUS
  Filled 2011-04-27: qty 2

## 2011-04-27 NOTE — ED Notes (Signed)
Pt presented to ed via ems c/o flu like symptoms startedtoday, febrile temp 102.6 on arrival, tachycardic hr 144

## 2011-04-27 NOTE — ED Notes (Signed)
BJY:[id number]<FA> Expected date:04/27/11<BR> Expected time: 9:27 PM<BR> Means of arrival:Ambulance<BR> Comments:<BR> FLU LIKE SYMPTOMS. From facility. GC [id number]. 55 yo f. Sick, fever. ST on ekg. Hypertensive, hx of same. 15+ min eta

## 2011-04-27 NOTE — ED Provider Notes (Signed)
History     CSN: [id number] Arrival date & time: 04/27/2011  9:38 PM   First MD Initiated Contact with Patient 04/27/11 2140      Chief Complaint  Patient presents with  . Fever    (Consider location/radiation/quality/duration/timing/severity/associated sxs/prior treatment) Patient is a 56 y.o. female presenting with fever. The history is provided by the patient and the nursing home.  Fever Primary symptoms of the febrile illness include fever, fatigue, cough, abdominal pain, nausea, vomiting, diarrhea and myalgias. Primary symptoms do not include visual change, headaches, wheezing, shortness of breath, dysuria, altered mental status, arthralgias or rash. The current episode started 3 to 5 days ago. This is a new problem. The problem has been gradually worsening.  The fever began today. The fever has been unchanged since its onset. The maximum temperature recorded prior to her arrival was 102 to 102.9 F. The temperature was taken by an oral thermometer.  The fatigue began 3 to 5 days ago. The fatigue has been unchanged since its onset.  The cough began 3 to 5 days ago. The cough is new. The cough is paroxysmal, non-productive and dry.  The abdominal pain began 2 days ago. The abdominal pain has been unchanged since its onset. The abdominal pain is generalized. The abdominal pain does not radiate. The severity of the abdominal pain is 3/10. The abdominal pain is relieved by nothing.  Nausea began yesterday. Associated with: Fever, vomiting, diarrhea, flulike symptoms. The nausea is exacerbated by food.  The vomiting began yesterday. Vomiting occurs 2 to 5 times per day. The emesis contains stomach contents. Risk factors: Living in a nursing facility.  The diarrhea began yesterday. The diarrhea is watery. The diarrhea occurs 2 to 4 times per day. Risk factors: Living in a nursing facility.  Myalgias began 2 days ago. The myalgias have been unchanged since their onset. The myalgias are  generalized. The myalgias are aching. The discomfort from the myalgias is mild. The myalgias are not associated with weakness, tenderness or swelling. Risk factors: Flulike illness.    Past Medical History  Diagnosis Date  . Allergic rhinitis   . Diabetes mellitus   . Hypertension   . Hypokalemia   . Depression   . Multiple sclerosis   . Vitamin D deficiency   . Dysmetabolic syndrome X   . Urinary incontinence   . Neuropathic pain     Past Surgical History  Procedure Date  . Cesarean section     No family history on file.  History  Substance Use Topics  . Smoking status: Never Smoker   . Smokeless tobacco: Never Used  . Alcohol Use: No    OB History    Grav Para Term Preterm Abortions TAB SAB Ect Mult Living                  Review of Systems  Constitutional: Positive for fever, chills, activity change, appetite change and fatigue. Negative for diaphoresis and unexpected weight change.  HENT: Positive for congestion, rhinorrhea and postnasal drip. Negative for hearing loss, ear pain, nosebleeds, sore throat, facial swelling, sneezing, drooling, mouth sores, trouble swallowing, neck pain, neck stiffness, dental problem, voice change, sinus pressure, tinnitus and ear discharge.   Eyes: Negative for photophobia, pain, discharge, redness and itching.  Respiratory: Positive for cough. Negative for choking, chest tightness, shortness of breath, wheezing and stridor.   Cardiovascular: Negative for chest pain, palpitations and leg swelling.  Gastrointestinal: Positive for nausea, vomiting, abdominal pain and diarrhea. Negative for  constipation, blood in stool, abdominal distention and anal bleeding.  Genitourinary: Negative for dysuria, urgency, frequency, hematuria, flank pain and difficulty urinating.  Musculoskeletal: Positive for myalgias. Negative for back pain, joint swelling, arthralgias and gait problem.  Skin: Negative for color change, pallor, rash and wound.    Neurological: Negative for dizziness, weakness, light-headedness and headaches.  Hematological: Positive for adenopathy. Does not bruise/bleed easily.  Psychiatric/Behavioral: Negative.  Negative for altered mental status.    Allergies  Review of patient's allergies indicates no known allergies.  Home Medications   Current Outpatient Rx  Name Route Sig Dispense Refill  . ASPIRIN 81 MG PO CHEW Oral Chew 81 mg by mouth daily.      Marland Kitchen VITAMIN D 1000 UNITS PO TABS Oral Take 1,000 Units by mouth daily.      Marland Kitchen CLONIDINE HCL 0.1 MG PO TABS Oral Take 0.1 mg by mouth 2 (two) times daily.      Marland Kitchen CRANBERRY 400 MG PO CAPS Oral Take 1 capsule by mouth daily.      Marland Kitchen FERROUS SULFATE 325 (65 FE) MG PO TABS Oral Take 325 mg by mouth daily with breakfast.      . GABAPENTIN 300 MG PO CAPS Oral Take 300 mg by mouth every 12 (twelve) hours.      . INTERFERON BETA-1A 30 MCG/0.5ML IM KIT Intramuscular Inject 30 mcg into the muscle every 7 (seven) days.      Marland Kitchen LISINOPRIL 10 MG PO TABS Oral Take 10 mg by mouth daily.      Marland Kitchen METFORMIN HCL 500 MG PO TABS Oral Take 500 mg by mouth 2 (two) times daily with a meal.      . MICONAZOLE NITRATE 2 % EX CREA Topical Apply 1 application topically daily as needed. For buttock redness/breakdown     . OXYBUTYNIN CHLORIDE ER 15 MG PO TB24 Oral Take 15 mg by mouth daily.      Marland Kitchen POLYETHYLENE GLYCOL 3350 PO PACK Oral Take 17 g by mouth daily.      Marland Kitchen PRAVASTATIN SODIUM 20 MG PO TABS Oral Take 20 mg by mouth every evening.        BP 159/113  Pulse 144  Temp(Src) 102.6 F (39.2 C) (Oral)  Resp 20  SpO2 95%  Physical Exam  Nursing note and vitals reviewed. Constitutional: She is oriented to person, place, and time. She appears well-developed and well-nourished. She appears distressed.  HENT:  Head: Normocephalic and atraumatic.  Right Ear: External ear normal.  Left Ear: External ear normal.  Mouth/Throat: No oropharyngeal exudate.       Dry mucous membranes, nasal  congestion, normal oropharynx without erythema or edema or exudate  Eyes: Conjunctivae are normal. Pupils are equal, round, and reactive to light. No scleral icterus.  Neck: Normal range of motion. Neck supple.  Cardiovascular: Regular rhythm, normal heart sounds and intact distal pulses.  Tachycardia present.  Exam reveals no gallop and no friction rub.   No murmur heard. Pulmonary/Chest: Effort normal and breath sounds normal. No respiratory distress. She has no wheezes. She has no rales.  Abdominal: Soft. Bowel sounds are normal. She exhibits no distension. There is no tenderness. There is no rebound and no guarding.  Musculoskeletal: Normal range of motion. She exhibits no edema and no tenderness.  Lymphadenopathy:    She has no cervical adenopathy.  Neurological: She is alert and oriented to person, place, and time. She has normal reflexes. No cranial nerve deficit. She exhibits normal muscle  tone. Coordination normal.  Skin: Skin is warm and dry. No rash noted. She is not diaphoretic. No erythema. No pallor.  Psychiatric: She has a normal mood and affect. Her behavior is normal.    ED Course  Procedures (including critical care time)   Labs Reviewed  CBC  DIFFERENTIAL  COMPREHENSIVE METABOLIC PANEL  LIPASE, BLOOD   No results found.   No diagnosis found.    MDM  This appears to be a flulike illness, possibly viral gastroenteritis, but a viral syndrome likely. Other considerations include pneumonia, acute cholecystitis, pancreatitis. Bowel obstruction is not thought to be likely given that the patient is having diarrhea. She does appear febrile and dehydrated and I will assess her electrolytes and give her fluid replacement as well as anti-emetics and analgesics. The patient will ultimately be able to be discharged home to rest.        Felisa Bonier, MD 04/27/11 2248

## 2011-04-28 LAB — DIFFERENTIAL
Basophils Relative: 0 % (ref 0–1)
Eosinophils Absolute: 0 10*3/uL (ref 0.0–0.7)
Eosinophils Relative: 1 % (ref 0–5)
Lymphs Abs: 0.8 10*3/uL (ref 0.7–4.0)
Neutrophils Relative %: 77 % (ref 43–77)

## 2011-04-28 LAB — CBC
MCH: 24.9 pg — ABNORMAL LOW (ref 26.0–34.0)
MCHC: 32.8 g/dL (ref 30.0–36.0)
MCV: 76.1 fL — ABNORMAL LOW (ref 78.0–100.0)
Platelets: 192 10*3/uL (ref 150–400)
RBC: 4.77 MIL/uL (ref 3.87–5.11)

## 2011-04-28 LAB — COMPREHENSIVE METABOLIC PANEL
ALT: 6 U/L (ref 0–35)
Albumin: 3.7 g/dL (ref 3.5–5.2)
BUN: 9 mg/dL (ref 6–23)
Calcium: 9.4 mg/dL (ref 8.4–10.5)
GFR calc Af Amer: >90 mL/min (ref >90)
Glucose, Bld: 138 mg/dL — ABNORMAL HIGH (ref 70–99)
Sodium: 135 mEq/L (ref 135–145)
Total Protein: 8.4 g/dL — ABNORMAL HIGH (ref 6.0–8.3)

## 2011-04-28 LAB — LIPASE, BLOOD: Lipase: 24 U/L (ref 11–59)

## 2011-04-28 MED ORDER — MORPHINE SULFATE 2 MG/ML IJ SOLN
4.0000 mg | Freq: Once | INTRAMUSCULAR | Status: AC
  Administered 2011-04-28: 4 mg via INTRAVENOUS
  Filled 2011-04-28: qty 2

## 2011-04-28 NOTE — ED Provider Notes (Signed)
Patient was signed out to me by Dr. Fredricka Bonine. At the time of signout patient is pending laboratory results returned. If this returns normal patient was safe for discharge back to her nursing facility. Labs are unremarkable. Patient will be discharged in good condition with prescriptions for Zofran and Vicodin as written by Dr. Fredricka Bonine.  Cyndra Numbers, MD 04/28/11 [phone]

## 2011-04-29 ENCOUNTER — Emergency Department (HOSPITAL_COMMUNITY)
Admission: EM | Admit: 2011-04-29 | Discharge: 2011-04-29 | Disposition: A | Discharge status: Home / Self Care | Payer: Medicare Other | Attending: Emergency Medicine | Admitting: Emergency Medicine

## 2011-04-29 ENCOUNTER — Emergency Department (HOSPITAL_COMMUNITY): Payer: Medicare Other

## 2011-04-29 ENCOUNTER — Encounter (HOSPITAL_COMMUNITY): Payer: Self-pay

## 2011-04-29 DIAGNOSIS — J9819 Other pulmonary collapse: Secondary | ICD-10-CM | POA: Insufficient documentation

## 2011-04-29 DIAGNOSIS — B349 Viral infection, unspecified: Secondary | ICD-10-CM

## 2011-04-29 DIAGNOSIS — R059 Cough, unspecified: Secondary | ICD-10-CM | POA: Insufficient documentation

## 2011-04-29 DIAGNOSIS — E119 Type 2 diabetes mellitus without complications: Secondary | ICD-10-CM | POA: Insufficient documentation

## 2011-04-29 DIAGNOSIS — R5381 Other malaise: Secondary | ICD-10-CM | POA: Insufficient documentation

## 2011-04-29 DIAGNOSIS — G35 Multiple sclerosis: Secondary | ICD-10-CM | POA: Insufficient documentation

## 2011-04-29 DIAGNOSIS — R05 Cough: Secondary | ICD-10-CM | POA: Insufficient documentation

## 2011-04-29 DIAGNOSIS — R509 Fever, unspecified: Secondary | ICD-10-CM | POA: Insufficient documentation

## 2011-04-29 DIAGNOSIS — B9789 Other viral agents as the cause of diseases classified elsewhere: Secondary | ICD-10-CM | POA: Insufficient documentation

## 2011-04-29 LAB — COMPREHENSIVE METABOLIC PANEL
ALT: 12 U/L (ref 0–35)
CO2: 27 mEq/L (ref 19–32)
Calcium: 9.6 mg/dL (ref 8.4–10.5)
GFR calc Af Amer: 74 mL/min — ABNORMAL LOW (ref >90)
GFR calc non Af Amer: 64 mL/min — ABNORMAL LOW (ref >90)
Glucose, Bld: 122 mg/dL — ABNORMAL HIGH (ref 70–99)
Sodium: 142 mEq/L (ref 135–145)

## 2011-04-29 LAB — URINALYSIS, ROUTINE W REFLEX MICROSCOPIC
Glucose, UA: NEGATIVE mg/dL
Leukocytes, UA: NEGATIVE
Protein, ur: 30 mg/dL — AB
Specific Gravity, Urine: 1.019 (ref 1.005–1.030)
pH: 5 (ref 5.0–8.0)

## 2011-04-29 LAB — CBC
HCT: 36.5 % (ref 36.0–46.0)
Hemoglobin: 12.1 g/dL (ref 12.0–15.0)
RBC: 4.72 MIL/uL (ref 3.87–5.11)

## 2011-04-29 LAB — DIFFERENTIAL
Lymphs Abs: 1.5 10*3/uL (ref 0.7–4.0)
Monocytes Relative: 15 % — ABNORMAL HIGH (ref 3–12)
Neutro Abs: 2.3 10*3/uL (ref 1.7–7.7)
Neutrophils Relative %: 51 % (ref 43–77)

## 2011-04-29 MED ORDER — SODIUM CHLORIDE 0.9 % IV SOLN
INTRAVENOUS | Status: DC
  Administered 2011-04-29: 16:00:00 via INTRAVENOUS

## 2011-04-29 NOTE — ED Notes (Signed)
Pt was here a couple of days ago with same symptoms.  Facility that pt lives in thought that she was worse today.  Decreased level of consc according to facility.

## 2011-04-29 NOTE — ED Notes (Signed)
RUE:[id number]<JW> Expected date:04/29/11<BR> Expected time: 3:03 PM<BR> Means of arrival:Ambulance<BR> Comments:<BR> EMS 40 GC, fever ? sepsis

## 2011-04-29 NOTE — ED Provider Notes (Signed)
History     CSN: [id number] Arrival date & time: 04/29/2011  3:04 PM    Chief Complaint  Patient presents with  . Fever   HPI Pt was seen at 1530.  Per pt and NH report, c/o gradual onset and persistence of constant subjective fevers/chills, generalized body aches/fatigue, cough, vague generalized abd "pain," and multiple intermittent episodes of N/V/D for the past 4 days.  Symptoms have been unchanged since onset.  Pt was eval in the ED for same symptoms 2 days ago and was discharged back to the NH with dx viral syndrome.  Denies black or blood in stools, no CP/SOB, no back pain, no rash.    Past Medical History  Diagnosis Date  . Allergic rhinitis   . Diabetes mellitus   . Hypertension   . Hypokalemia   . Depression   . Multiple sclerosis   . Vitamin D deficiency   . Dysmetabolic syndrome X   . Urinary incontinence   . Neuropathic pain     Past Surgical History  Procedure Date  . Cesarean section     History  Substance Use Topics  . Smoking status: Never Smoker   . Smokeless tobacco: Never Used  . Alcohol Use: No    Review of Systems ROS: Statement: All systems negative except as marked or noted in the HPI; Constitutional: Negative for fever and +chills, generalized body aches/fatigue. ; ; Eyes: Negative for eye pain, redness and discharge. ; ; ENMT: Negative for ear pain, hoarseness, nasal congestion, sinus pressure and sore throat. ; ; Cardiovascular: Negative for chest pain, palpitations, diaphoresis, dyspnea and peripheral edema. ; ; Respiratory: +cough. Negative for wheezing and stridor. ; ; Gastrointestinal: +nausea, vomiting, diarrhea, abdominal pain; negative for blood in stool, hematemesis, jaundice and rectal bleeding. . ; ; Genitourinary: Negative for dysuria, flank pain and hematuria. ; ; Musculoskeletal: Negative for back pain and neck pain. Negative for swelling and trauma.; ; Skin: Negative for pruritus, rash, abrasions, blisters, bruising and skin lesion.; ;  Neuro: Negative for headache, lightheadedness and neck stiffness. Negative for weakness, altered level of consciousness , altered mental status, extremity weakness, paresthesias, involuntary movement, seizure and syncope.     Allergies  Review of patient's allergies indicates no known allergies.  Home Medications   Current Outpatient Rx  Name Route Sig Dispense Refill  . ASPIRIN 81 MG PO CHEW Oral Chew 81 mg by mouth daily.      Marland Kitchen VITAMIN D 1000 UNITS PO TABS Oral Take 1,000 Units by mouth daily.      Marland Kitchen CLONIDINE HCL 0.1 MG PO TABS Oral Take 0.1 mg by mouth daily.     Marland Kitchen CRANBERRY 400 MG PO CAPS Oral Take 1 capsule by mouth daily.      Marland Kitchen FERROUS SULFATE 325 (65 FE) MG PO TABS Oral Take 325 mg by mouth daily with breakfast.      . GABAPENTIN 300 MG PO CAPS Oral Take 300 mg by mouth every 12 (twelve) hours.      . INTERFERON BETA-1A 30 MCG/0.5ML IM KIT Intramuscular Inject 30 mcg into the muscle every 7 (seven) days.      Marland Kitchen LISINOPRIL 10 MG PO TABS Oral Take 10 mg by mouth daily.      Marland Kitchen METFORMIN HCL 500 MG PO TABS Oral Take 500 mg by mouth 2 (two) times daily with a meal.      . OXYBUTYNIN CHLORIDE ER 15 MG PO TB24 Oral Take 15 mg by mouth daily.      Marland Kitchen  POLYETHYLENE GLYCOL 3350 PO PACK Oral Take 17 g by mouth daily.      Marland Kitchen PRAVASTATIN SODIUM 20 MG PO TABS Oral Take 20 mg by mouth every evening.      Marland Kitchen HYDROCODONE-ACETAMINOPHEN 5-325 MG PO TABS Oral Take 2 tablets by mouth every 4 (four) hours as needed for pain. 12 tablet 0  . MICONAZOLE NITRATE 2 % EX CREA Topical Apply 1 application topically daily as needed. For buttock redness/breakdown     . ONDANSETRON HCL 8 MG PO TABS Oral Take 1 tablet (8 mg total) by mouth every 8 (eight) hours as needed for nausea. 12 tablet 0    BP 112/75  Pulse 106  Temp(Src) 98.2 F (36.8 C) (Rectal)  Resp 16  SpO2 98%  Physical Exam 1535: Physical examination:  Nursing notes reviewed; Vital signs and O2 SAT reviewed;  Constitutional: Well developed,  Well nourished, In no acute distress; Head:  Normocephalic, atraumatic; Eyes: EOMI, PERRL, No scleral icterus; ENMT: Mouth and pharynx normal, Mucous membranes dry; Neck: Supple, Full range of motion, No lymphadenopathy; Cardiovascular: Regular rate and rhythm, No murmur, rub, or gallop; Respiratory: Breath sounds clear & equal bilaterally, No rales, rhonchi, wheezes, or rub, Normal respiratory effort/excursion; Chest: Nontender, Movement normal; Abdomen: Soft, Nontender, Nondistended, Normal bowel sounds; Extremities: Pulses normal, No tenderness, No edema, No calf edema or asymmetry.; Neuro: AA&Ox3, Major CN grossly intact. No facial droop, speech clear. +LUE and LLE decreased ROM per hx CVA.; Skin: Color normal, Warm, Dry, no rash.    ED Course  Procedures  MDM  MDM Reviewed: previous chart, nursing note and vitals Reviewed previous: labs Interpretation: labs and x-ray   Results for orders placed during the hospital encounter of 04/29/11  CBC      Component Value Range   WBC 4.5  4.0 - 10.5 (K/uL)   RBC 4.72  3.87 - 5.11 (MIL/uL)   Hemoglobin 12.1  12.0 - 15.0 (g/dL)   HCT 04.5  40.9 - 81.1 (%)   MCV 77.3 (*) 78.0 - 100.0 (fL)   MCH 25.6 (*) 26.0 - 34.0 (pg)   MCHC 33.2  30.0 - 36.0 (g/dL)   RDW 91.4  78.2 - 95.6 (%)   Platelets 155  150 - 400 (K/uL)  DIFFERENTIAL      Component Value Range   Neutrophils Relative 51  43 - 77 (%)   Neutro Abs 2.3  1.7 - 7.7 (K/uL)   Lymphocytes Relative 33  12 - 46 (%)   Lymphs Abs 1.5  0.7 - 4.0 (K/uL)   Monocytes Relative 15 (*) 3 - 12 (%)   Monocytes Absolute 0.7  0.1 - 1.0 (K/uL)   Eosinophils Relative 0  0 - 5 (%)   Eosinophils Absolute 0.0  0.0 - 0.7 (K/uL)   Basophils Relative 0  0 - 1 (%)   Basophils Absolute 0.0  0.0 - 0.1 (K/uL)  LACTIC ACID, PLASMA      Component Value Range   Lactic Acid, Venous 1.6  0.5 - 2.2 (mmol/L)  URINALYSIS, ROUTINE W REFLEX MICROSCOPIC      Component Value Range   Color, Urine YELLOW  YELLOW     APPearance CLOUDY (*) CLEAR    Specific Gravity, Urine 1.019  1.005 - 1.030    pH 5.0  5.0 - 8.0    Glucose, UA NEGATIVE  NEGATIVE (mg/dL)   Hgb urine dipstick NEGATIVE  NEGATIVE    Bilirubin Urine SMALL (*) NEGATIVE    Ketones, ur NEGATIVE  NEGATIVE (mg/dL)   Protein, ur 30 (*) NEGATIVE (mg/dL)   Urobilinogen, UA 1.0  0.0 - 1.0 (mg/dL)   Nitrite NEGATIVE  NEGATIVE    Leukocytes, UA NEGATIVE  NEGATIVE   LIPASE, BLOOD      Component Value Range   Lipase 28  11 - 59 (U/L)  COMPREHENSIVE METABOLIC PANEL      Component Value Range   Sodium 142  135 - 145 (mEq/L)   Potassium 3.4 (*) 3.5 - 5.1 (mEq/L)   Chloride 105  96 - 112 (mEq/L)   CO2 27  19 - 32 (mEq/L)   Glucose, Bld 122 (*) 70 - 99 (mg/dL)   BUN 16  6 - 23 (mg/dL)   Creatinine, Ser 1.19  0.50 - 1.10 (mg/dL)   Calcium 9.6  8.4 - 14.7 (mg/dL)   Total Protein 8.0  6.0 - 8.3 (g/dL)   Albumin 3.5  3.5 - 5.2 (g/dL)   AST 21  0 - 37 (U/L)   ALT 12  0 - 35 (U/L)   Alkaline Phosphatase 51  39 - 117 (U/L)   Total Bilirubin 0.3  0.3 - 1.2 (mg/dL)   GFR calc non Af Amer 64 (*) >90 (mL/min)   GFR calc Af Amer 74 (*) >90 (mL/min)  URINE MICROSCOPIC-ADD ON      Component Value Range   Squamous Epithelial / LPF RARE  RARE    WBC, UA 0-2  <3 (WBC/hpf)   RBC / HPF 0-2  <3 (RBC/hpf)   Urine-Other AMORPHOUS URATES/PHOSPHATES     Dg Chest 2 View  04/29/2011  *RADIOLOGY REPORT*  Clinical Data: Rule out infiltrate  CHEST - 2 VIEW  Comparison: 04/27/2011  Findings: Cardiomediastinal silhouette is stable.  No pulmonary edema.  Elevation of the left hemidiaphragm is noted.  There is left base atelectasis or infiltrate.  IMPRESSION: No pulmonary edema.  Elevation of the left hemidiaphragm.  Left basilar atelectasis or infiltrate.  Original Report Authenticated By: Natasha Mead, M.D.    7:25 PM:  No fevers in ED, VSS.  Has tol PO food and fluids well while in ED without N/V/D. Wants to go back to NH now.  Afebrile today.  No signs of infection on  today's exam or on ED 2d ago for same symptoms.  Appears viral process.  Dx testing d/w pt.  Questions answered.  Verb understanding, agreeable to d/c home with outpt f/u.         Ronie Barnhart Allison Quarry, DO 05/01/11 2015

## 2011-04-29 NOTE — ED Notes (Signed)
Pt alert and oriented x4. Respirations even and unlabored, bilateral symmetrical rise and fall of chest. Skin warm and dry. In no acute distress. Denies needs.   

## 2011-04-30 LAB — URINE CULTURE
Colony Count: NO GROWTH
Culture  Setup Time: 201212140116

## 2011-10-20 ENCOUNTER — Emergency Department (HOSPITAL_COMMUNITY): Payer: Medicare Other

## 2011-10-20 ENCOUNTER — Emergency Department (HOSPITAL_COMMUNITY)
Admission: EM | Admit: 2011-10-20 | Discharge: 2011-10-20 | Disposition: A | Discharge status: Skilled Nursing Facility | Payer: Medicare Other | Attending: Emergency Medicine | Admitting: Emergency Medicine

## 2011-10-20 ENCOUNTER — Encounter (HOSPITAL_COMMUNITY): Payer: Self-pay | Admitting: *Deleted

## 2011-10-20 DIAGNOSIS — R4789 Other speech disturbances: Secondary | ICD-10-CM

## 2011-10-20 DIAGNOSIS — G35 Multiple sclerosis: Secondary | ICD-10-CM

## 2011-10-20 DIAGNOSIS — I69959 Hemiplegia and hemiparesis following unspecified cerebrovascular disease affecting unspecified side: Secondary | ICD-10-CM | POA: Insufficient documentation

## 2011-10-20 DIAGNOSIS — I10 Essential (primary) hypertension: Secondary | ICD-10-CM | POA: Insufficient documentation

## 2011-10-20 DIAGNOSIS — E119 Type 2 diabetes mellitus without complications: Secondary | ICD-10-CM | POA: Insufficient documentation

## 2011-10-20 DIAGNOSIS — Z79899 Other long term (current) drug therapy: Secondary | ICD-10-CM | POA: Insufficient documentation

## 2011-10-20 DIAGNOSIS — Z7982 Long term (current) use of aspirin: Secondary | ICD-10-CM | POA: Insufficient documentation

## 2011-10-20 DIAGNOSIS — R471 Dysarthria and anarthria: Secondary | ICD-10-CM | POA: Insufficient documentation

## 2011-10-20 LAB — COMPREHENSIVE METABOLIC PANEL
AST: 13 U/L (ref 0–37)
CO2: 23 mEq/L (ref 19–32)
Calcium: 9.3 mg/dL (ref 8.4–10.5)
Creatinine, Ser: 0.65 mg/dL (ref 0.50–1.10)
GFR calc non Af Amer: >90 mL/min (ref >90)

## 2011-10-20 LAB — CBC
Hemoglobin: 12.1 g/dL (ref 12.0–15.0)
Platelets: 182 10*3/uL (ref 150–400)
RBC: 4.73 MIL/uL (ref 3.87–5.11)
WBC: 5.6 10*3/uL (ref 4.0–10.5)

## 2011-10-20 LAB — POCT I-STAT, CHEM 8
Calcium, Ion: 1.17 mmol/L (ref 1.12–1.32)
Glucose, Bld: 126 mg/dL — ABNORMAL HIGH (ref 70–99)
HCT: 38 % (ref 36.0–46.0)
Hemoglobin: 12.9 g/dL (ref 12.0–15.0)
Potassium: 3.7 mEq/L (ref 3.5–5.1)
TCO2: 23 mmol/L (ref 0–100)

## 2011-10-20 LAB — CK TOTAL AND CKMB (NOT AT ARMC)
CK, MB: 1.9 ng/mL (ref 0.3–4.0)
Relative Index: INVALID (ref 0.0–2.5)

## 2011-10-20 LAB — DIFFERENTIAL
Basophils Absolute: 0 10*3/uL (ref 0.0–0.1)
Basophils Relative: 0 % (ref 0–1)
Eosinophils Absolute: 0.2 10*3/uL (ref 0.0–0.7)
Lymphs Abs: 1 10*3/uL (ref 0.7–4.0)
Monocytes Relative: 7 % (ref 3–12)
Neutro Abs: 4 10*3/uL (ref 1.7–7.7)
Neutrophils Relative %: 72 % (ref 43–77)

## 2011-10-20 LAB — PROTIME-INR
INR: 0.95 (ref 0.00–1.49)
Prothrombin Time: 12.9 seconds (ref 11.6–15.2)

## 2011-10-20 LAB — APTT: aPTT: 33 seconds (ref 24–37)

## 2011-10-20 LAB — URINALYSIS, ROUTINE W REFLEX MICROSCOPIC
Hgb urine dipstick: NEGATIVE
Protein, ur: NEGATIVE mg/dL
Urobilinogen, UA: 1 mg/dL (ref 0.0–1.0)

## 2011-10-20 MED ORDER — GADOBENATE DIMEGLUMINE 529 MG/ML IV SOLN
13.0000 mL | Freq: Once | INTRAVENOUS | Status: AC | PRN
  Administered 2011-10-20: 13 mL via INTRAVENOUS

## 2011-10-20 MED ORDER — ACETAMINOPHEN 325 MG PO TABS
650.0000 mg | ORAL_TABLET | Freq: Once | ORAL | Status: AC
  Administered 2011-10-20: 650 mg via ORAL

## 2011-10-20 MED ORDER — ACETAMINOPHEN 325 MG PO TABS
ORAL_TABLET | ORAL | Status: AC
  Filled 2011-10-20: qty 2

## 2011-10-20 MED ORDER — SODIUM CHLORIDE 0.9 % IV BOLUS (SEPSIS)
500.0000 mL | Freq: Once | INTRAVENOUS | Status: AC
  Administered 2011-10-20: 500 mL via INTRAVENOUS

## 2011-10-20 NOTE — ED Notes (Signed)
Pt in via [facility] from [facility] c/o slurred speech onset today @ 12:30, last seen normal @ 11:30, CBG 120, pt verbalized to EMS hx of previous CVA, no documentation of anticoagulants Rx, Pt A&O x4 in route & upon arrival to ED

## 2011-10-20 NOTE — ED Provider Notes (Signed)
History     CSN: [id number]  Arrival date & time 10/20/11  1251   First MD Initiated Contact with Patient 10/20/11 1255      Chief Complaint  Patient presents with  . Code Stroke    (Consider location/radiation/quality/duration/timing/severity/associated sxs/prior treatment) HPI  55yoF h/o CVA, DM, HTN, MS pw new onset worsening slurred speech and worsening Left weakness. Patient does have left hemiparesis at baseline. Approx 1130AM patient was witnessed to have normal speech and shortly thereafter began to exhibit slurred speech. Question of worsening LUE/LLE weakness. Denies numbness/tingling. Non ambulatory since event. Denies headache, dizziness, change in vision. Denies cp/sob/palpitations. Glu 120 pta.  Past Medical History  Diagnosis Date  . Allergic rhinitis   . Diabetes mellitus   . Hypertension   . Hypokalemia   . Depression   . Multiple sclerosis   . Vitamin d deficiency   . Dysmetabolic syndrome X   . Urinary incontinence   . Neuropathic pain     Past Surgical History  Procedure Date  . Cesarean section     No family history on file.  History  Substance Use Topics  . Smoking status: Never Smoker   . Smokeless tobacco: Never Used  . Alcohol Use: No    OB History    Grav Para Term Preterm Abortions TAB SAB Ect Mult Living                  Review of Systems  All other systems reviewed and are negative.  except as noted HPI   Allergies  Review of patient's allergies indicates no known allergies.  Home Medications   Current Outpatient Rx  Name Route Sig Dispense Refill  . ASPIRIN 81 MG PO CHEW Oral Chew 81 mg by mouth daily.      Marland Kitchen VITAMIN D 1000 UNITS PO TABS Oral Take 1,000 Units by mouth daily.      Marland Kitchen CLONIDINE HCL 0.1 MG PO TABS Oral Take 0.1 mg by mouth daily.     Marland Kitchen CRANBERRY 400 MG PO CAPS Oral Take 1 capsule by mouth daily.      Marland Kitchen FERROUS SULFATE 325 (65 FE) MG PO TABS Oral Take 325 mg by mouth daily with breakfast.      . GABAPENTIN  300 MG PO CAPS Oral Take 300 mg by mouth every 12 (twelve) hours.      . IBUPROFEN 800 MG PO TABS Oral Take 800 mg by mouth every 8 (eight) hours as needed. For pain    . INTERFERON BETA-1A 30 MCG/0.5ML IM KIT Intramuscular Inject 30 mcg into the muscle every 7 (seven) days.      Marland Kitchen LISINOPRIL 10 MG PO TABS Oral Take 10 mg by mouth daily.      Marland Kitchen METFORMIN HCL 500 MG PO TABS Oral Take 500 mg by mouth 2 (two) times daily with a meal.      . MICONAZOLE NITRATE 2 % EX CREA Topical Apply 1 application topically daily as needed. For buttock redness/breakdown     . OXYBUTYNIN CHLORIDE ER 15 MG PO TB24 Oral Take 15 mg by mouth daily.      Marland Kitchen PRAVASTATIN SODIUM 20 MG PO TABS Oral Take 20 mg by mouth every evening.        BP 145/100  Pulse 107  Temp(Src) 99.2 F (37.3 C) (Oral)  Resp 22  SpO2 100%  Physical Exam  Nursing note and vitals reviewed. Constitutional: She is oriented to person, place, and time. She appears  well-developed.  HENT:  Head: Atraumatic.  Mouth/Throat: Oropharynx is clear and moist.       No tongue swelling Posterior op unremarkable  Eyes: Conjunctivae and EOM are normal. Pupils are equal, round, and reactive to light.  Neck: Normal range of motion. Neck supple.  Cardiovascular: Regular rhythm, normal heart sounds and intact distal pulses.        Min tachycardia  Pulmonary/Chest: Effort normal and breath sounds normal. No respiratory distress. She has no wheezes. She has no rales.  Abdominal: Soft. She exhibits no distension. There is tenderness. There is no rebound and no guarding.       Min periumbilical ttp No r/g  Musculoskeletal: Normal range of motion.  Neurological: She is alert and oriented to person, place, and time.       LUE 4+/5 strength LLE 1/5 strength  slurred speech  Skin: Skin is warm and dry. No rash noted.  Psychiatric: She has a normal mood and affect.    ED Course  Procedures (including critical care time)  Labs Reviewed  CBC - Abnormal;  Notable for the following:    MCV 77.6 (*)    MCH 25.6 (*)    All other components within normal limits  COMPREHENSIVE METABOLIC PANEL - Abnormal; Notable for the following:    Glucose, Bld 123 (*)    All other components within normal limits  POCT I-STAT, CHEM 8 - Abnormal; Notable for the following:    Glucose, Bld 126 (*)    All other components within normal limits  GLUCOSE, CAPILLARY - Abnormal; Notable for the following:    Glucose-Capillary 118 (*)    All other components within normal limits  PROTIME-INR  APTT  DIFFERENTIAL  CK TOTAL AND CKMB  TROPONIN I  URINALYSIS, ROUTINE W REFLEX MICROSCOPIC   Dg Chest 2 View  10/20/2011  *RADIOLOGY REPORT*  Clinical Data: Code stroke  CHEST - 2 VIEW  Comparison: 04/29/2011  Findings: Cardiomediastinal silhouette is stable. Stable elevation of the left hemidiaphragm.  Again noted left basilar scarring or chronic atelectasis. No segmental infiltrate or pulmonary edema. Bony thorax is stable.  IMPRESSION: Stable elevation of the left hemidiaphragm.  Again noted left basilar scarring or chronic atelectasis. No segmental infiltrate or pulmonary edema.  Original Report Authenticated By: Natasha Mead, M.D.   Ct Head Wo Contrast  10/20/2011  *RADIOLOGY REPORT*  Clinical Data: Baseline left-sided weakness, with worsening today.  CT HEAD WITHOUT CONTRAST  Technique:  Contiguous axial images were obtained from the base of the skull through the vertex without contrast.  Comparison: Prior MRI 12/14/2006.  Findings: There is no evidence for acute infarction, intracranial hemorrhage, mass lesion, hydrocephalus, or extra-axial fluid. There is moderate cerebral and cerebellar atrophy.  Hypodensity white matter appears similar to priors, consistent with the history of chronic MS.  The calvarium is intact.  There is no acute sinus or mastoid disease.  IMPRESSION: No visible acute stroke.  Chronic changes as described.  Critical Value/emergent results were called by  telephone at the time of interpretation on 10/20/2011  at 1:25 p.m.  to  Dr. Thad Ranger, who verbally acknowledged these results.  Original Report Authenticated By: Elsie Stain, M.D.   Mr Laqueta Jean Wo Contrast  10/20/2011  *RADIOLOGY REPORT*  Clinical Data: Code stroke, speech difficulty. Left-sided weakness. History of multiple sclerosis.  MRI HEAD WITHOUT AND WITH CONTRAST  Technique:  Multiplanar, multiecho pulse sequences of the brain and surrounding structures were obtained according to standard protocol without and with  intravenous contrast  Contrast: 13mL MULTIHANCE GADOBENATE DIMEGLUMINE 529 MG/ML IV SOLN  Comparison: CT head 10/20/2011.  MR head 12/14/2006.  Findings: No acute stroke, acute hemorrhage, mass lesion, hydrocephalus, or extra-axial fluid.  No foci of restricted diffusion to suggest acute MS exacerbation.  Severe premature atrophy.  Extensive chronic cerebral, cerebellar, and brainstem white matter disease consistent with the clinical impression of multiple sclerosis.  Superimposed chronic microvascular ischemic change related to diabetes and hypertension is not excluded however.  There is no midline shift.  No intracranial midline abnormalities are detected.  There is no acute sinus or mastoid disease.  IMPRESSION: No visible acute stroke or hemorrhage.  No intracranial space- occupying lesion.  Severe atrophy and premature white matter disease, likely chronic MS although superimposed chronic microvascular ischemic change of the white matter is not excluded.  Original Report Authenticated By: Elsie Stain, M.D.     No diagnosis found.   MDM  Worsening slurred speech, baseline LUE/LLE weakness per patient. CODE STROKE. Seen by neurology in ED immediately. Only initiate stroke w/u if MRI with acute stroke. Only requires admission for MRI abnl or other cause found in ED. CT head and MRI as above. No acute stroke. EKG nondiagnostic, CXR unremarkable. Labs unremarkable. Her tongue is a  little large and she is on ACI I. Angioedema considered but patient states that she does not have subj swelling of tongue or throat. Pt states she would like a sandwich and to go home. U/A Pending. RN to perform swallow screen. Signed out to Dr. Manus Gunning.        Forbes Cellar, MD 10/23/11 [phone]

## 2011-10-20 NOTE — ED Notes (Signed)
Second discharge condition error in charting

## 2011-10-20 NOTE — Discharge Instructions (Signed)
Multiple Sclerosis There is no evidence of a stroke. Follow up with your doctor and neurologist.  Return to the ED if you develop new or worsening symptoms. Multiple sclerosis (MS) is a disease of the central nervous system. Its cause is unknown. It is more common in the [place] states than in the [place] states. There is a higher incidence of MS in women. There is a wide variation in the symptoms (problems) of MS. This is because of the many different ways it affects the central nervous system. It often comes on in episodes or attacks. These attacks may last weeks to months. There may be long periods of nearly no problems between attacks. The main symptoms include visual problems (associated with eye pain), numbness, weakness, and paralysis in extremities (arms/hands and legs/feet). There may also be tremors and problems with balance and walking. The age when MS starts is variable. Advances in medicine continue to improve the treatment of this illness. There is no known cure for MS but there are medications that help. MS is not an inherited illness, although your risk of getting this disease is higher if you have a relative with MS. The best radiologic (x-ray) study for MS is an MRI (magnetic resonance imaging). There are medications available to decrease the number and frequency of attacks. SYMPTOMS  The symptoms of MS are caused by loss of insulation (myelin) of the nerves of the brain. When this happens, brain signals do not get transmitted properly or may not get transmitted at all. Some of the problems caused by this include:   Numbness.   Weakness.   Paralysis in extremities.   Visual problems, eye pain.   Balance problems.   Tremors.  DIAGNOSIS  Your caregiver can do studies on you to make this diagnosis. This may include specialized X-rays and spinal fluid studies. HOME CARE INSTRUCTIONS   Take medications as directed by your caregiver. Baclofen is a drug commonly used to reduce  muscle spasticity. Steroids are often used for short term relief.   Exercise as directed.   Use physical and occupational therapy as directed by your caregiver. Careful attention to this medical care can help avoid depression.   See your caregiver if you begin to have problems with depression. This is a common problem in MS. Patients often continue to work many years after the diagnosis of MS.  Document Released: 04/30/2000 Document Revised: 04/22/2011 Document Reviewed: 12/07/2006 [facility] Patient Information 2012 [place], [place].

## 2011-10-20 NOTE — ED Notes (Signed)
[name], Sister, [phone]. Please call for any concerns and with results when or if she is admitted.

## 2011-10-20 NOTE — ED Notes (Signed)
Call made to [facility] spoke with Lewie Chamber, RN re: pick up of discharge paperwork which did not leave with pt upon discharge, [facility] staff to pick up paperwork tonight at ED nurse first desk

## 2011-10-20 NOTE — Consult Note (Signed)
Referring Physician: Hyman Hopes    Chief Complaint: Slurred speech  HPI: [name] is an 56 y.o. female who was at lunch today and was noted to have a change in speech.  Staff at her facility felt that her speech was slurred.  Patient has a history of MS and reports that she had a stroke about 2 years ago and has a residual of left sided weakness and numbness.  She is confined to a wheelchair for most of her day.    LSN: [id number] tPA Given: No: Minimal deficit  Past Medical History  Diagnosis Date  . Allergic rhinitis   . Diabetes mellitus   . Hypertension   . Hypokalemia   . Depression   . Multiple sclerosis   . Vitamin d deficiency   . Dysmetabolic syndrome X   . Urinary incontinence   . Neuropathic pain     Past Surgical History  Procedure Date  . Cesarean section     No family history on file. Social History:  reports that she has never smoked. She has never used smokeless tobacco. She reports that she does not drink alcohol or use illicit drugs.  Allergies: No Known Allergies  Medications: I have reviewed the patient's current medications. Prior to Admission:  ASA, Vitamin D, Catapres, Cranberry, Ferrous sulfate, Neurontin, Advil, Avonex, Lisinopril, Metformin, Miconazole, Ditropan, Pravachol  ROS: History obtained from the patient  General ROS: negative for - chills, fatigue, fever, night sweats, weight gain or weight loss Psychological ROS: negative for - behavioral disorder, hallucinations, memory difficulties, mood swings or suicidal ideation Ophthalmic ROS: negative for - blurry vision, double vision, eye pain or loss of vision ENT ROS: negative for - epistaxis, nasal discharge, oral lesions, sore throat, tinnitus or vertigo Allergy and Immunology ROS: negative for - hives or itchy/watery eyes Hematological and Lymphatic ROS: negative for - bleeding problems, bruising or swollen lymph nodes Endocrine ROS: negative for - galactorrhea, hair pattern changes,  polydipsia/polyuria or temperature intolerance Respiratory ROS: negative for - cough, hemoptysis, shortness of breath or wheezing Cardiovascular ROS: negative for - chest pain, dyspnea on exertion, edema or irregular heartbeat Gastrointestinal ROS: negative for - abdominal pain, diarrhea, hematemesis, nausea/vomiting or stool incontinence Genito-Urinary ROS: negative for - dysuria, hematuria, incontinence or urinary frequency/urgency Musculoskeletal ROS: negative for - joint swelling or muscular weakness Neurological ROS: as noted in HPI Dermatological ROS: negative for rash and skin lesion changes  Physical Examination: Blood pressure 145/100, pulse 107, temperature 99.2 F (37.3 C), temperature source Oral, resp. rate 22, SpO2 100.00%.  Neurologic Examination: Mental Status: Alert, oriented, thought content appropriate.  Speech slurred.  Able to follow 3 step commands without difficulty. Cranial Nerves: II: visual fields grossly normal, pupils equal, round, reactive to light and accommodation III,IV, VI: left ptosis, extra-ocular motions intact bilaterally V,VII: left facial, facial light touch sensation normal bilaterally VIII: hearing normal bilaterally IX,X: gag reflex present XI: trapezius strength/neck flexion strength normal bilaterally XII: tongue strength normal  Motor: Right : Upper extremity   5/5    Left:     Upper extremity   5/5  Lower extremity   3-/5     Lower extremity   2/5 Tone and bulk:increased tone in the BLE's Sensory: Pinprick and light touch decreased on the left Deep Tendon Reflexes: 2+ in the upper extremities, absent in the lower extremities Plantars: Right: equivocal   Left: equivocal Cerebellar: normal finger-to-nose  Results for orders placed during the hospital encounter of 10/20/11 (from the past 48  hour(s))  PROTIME-INR     Status: Normal   Collection Time   10/20/11  1:05 PM      Component Value Range Comment   Prothrombin Time 12.9  11.6 -  15.2 (seconds)    INR 0.95  0.00 - 1.49    APTT     Status: Normal   Collection Time   10/20/11  1:05 PM      Component Value Range Comment   aPTT 33  24 - 37 (seconds)   CBC     Status: Abnormal   Collection Time   10/20/11  1:05 PM      Component Value Range Comment   WBC 5.6  4.0 - 10.5 (K/uL)    RBC 4.73  3.87 - 5.11 (MIL/uL)    Hemoglobin 12.1  12.0 - 15.0 (g/dL)    HCT 30.8  65.7 - 84.6 (%)    MCV 77.6 (*) 78.0 - 100.0 (fL)    MCH 25.6 (*) 26.0 - 34.0 (pg)    MCHC 33.0  30.0 - 36.0 (g/dL)    RDW 96.2  95.2 - 84.1 (%)    Platelets 182  150 - 400 (K/uL)   DIFFERENTIAL     Status: Normal   Collection Time   10/20/11  1:05 PM      Component Value Range Comment   Neutrophils Relative 72  43 - 77 (%)    Neutro Abs 4.0  1.7 - 7.7 (K/uL)    Lymphocytes Relative 19  12 - 46 (%)    Lymphs Abs 1.0  0.7 - 4.0 (K/uL)    Monocytes Relative 7  3 - 12 (%)    Monocytes Absolute 0.4  0.1 - 1.0 (K/uL)    Eosinophils Relative 3  0 - 5 (%)    Eosinophils Absolute 0.2  0.0 - 0.7 (K/uL)    Basophils Relative 0  0 - 1 (%)    Basophils Absolute 0.0  0.0 - 0.1 (K/uL)   POCT I-STAT, CHEM 8     Status: Abnormal   Collection Time   10/20/11  1:07 PM      Component Value Range Comment   Sodium 142  135 - 145 (mEq/L)    Potassium 3.7  3.5 - 5.1 (mEq/L)    Chloride 105  96 - 112 (mEq/L)    BUN 11  6 - 23 (mg/dL)    Creatinine, Ser 3.24  0.50 - 1.10 (mg/dL)    Glucose, Bld 401 (*) 70 - 99 (mg/dL)    Calcium, Ion 0.27  1.12 - 1.32 (mmol/L)    TCO2 23  0 - 100 (mmol/L)    Hemoglobin 12.9  12.0 - 15.0 (g/dL)    HCT 25.3  66.4 - 40.3 (%)   GLUCOSE, CAPILLARY     Status: Abnormal   Collection Time   10/20/11  1:21 PM      Component Value Range Comment   Glucose-Capillary 118 (*) 70 - 99 (mg/dL)    Ct Head Wo Contrast  10/20/2011  *RADIOLOGY REPORT*  Clinical Data: Baseline left-sided weakness, with worsening today.  CT HEAD WITHOUT CONTRAST  Technique:  Contiguous axial images were obtained from the base  of the skull through the vertex without contrast.  Comparison: Prior MRI 12/14/2006.  Findings: There is no evidence for acute infarction, intracranial hemorrhage, mass lesion, hydrocephalus, or extra-axial fluid. There is moderate cerebral and cerebellar atrophy.  Hypodensity white matter appears similar to priors, consistent with the history of chronic  MS.  The calvarium is intact.  There is no acute sinus or mastoid disease.  IMPRESSION: No visible acute stroke.  Chronic changes as described.  Critical Value/emergent results were called by telephone at the time of interpretation on 10/20/2011  at 1:25 p.m.  to  Dr. Thad Ranger, who verbally acknowledged these results.  Original Report Authenticated By: Elsie Stain, M.D.    Assessment: 56 y.o. female presenting as a code stroke with difficulty with speech.  CT only remarkable for chronic changes.  Many of her other deficits are chronic and likely related to her MS and old infarcts.  Unclear if her findings today are related to an acute infarct or possibly an exacerbation of her MS related to other medical issues.    Stroke Risk Factors - diabetes mellitus, hypertension and stroke in the past.  Plan: 1. MRI, MRA  of the brain with contrast.  Would only do stroke work up if an acute infarct noted that would include echocardiogram and carotid dopplers 2. Prophylactic therapy-continue ASA 3. Risk factor modification 4. Telemetry monitoring 5. Frequent neuro checks   Thana Farr, MD [facility] [phone] 10/20/2011, 1:42 PM

## 2011-10-20 NOTE — ED Notes (Signed)
Stroke encoded @ 12:37, phlebotomy arrived at 12:48, stroke team arrived @ 12:50, Pt arrived via [facility] @ 12:51, EDP exam was completed @ 12:52,, pt arrived in CT @ 12:57, CT read @ 13:10, reported last seen normal for pt 11:30 today, pt symptoms recognized @ 12:30, Code Stroke was cancelled @ 13:19 by Thana Farr, MD, pt not a tPA candidate, pt placed on cardiac monitor, pt remains A&O x4, pt denies pain, will continue to monitor the pt

## 2011-10-20 NOTE — Code Documentation (Addendum)
56 year old female presents to [facility] as Code Stroke.  Stoke called at [facility] by EMS with ETA 10 mins.  Patient arrived to ED at 1251 - EDP exam at 1252.  Stroke team arrival at 1250. To CT scan at 1257..    LSW 1130.  Patient resides in nursing home.  Staff there reported that at 1130 this AM they were in patients room and patient was at baseline.  At 1230 they saw her in lunch room and she had developed slurred speech.  CBG was 120.  Patient has hx MS with baseline bil leg weakness - unable to resist gravity and some left side weakness.  NIHSS 08.  Slurred speech was new finding.  Patient wears dentures - states she has not had them in all day today - oral mucosa very dry.  Dr. Thad Ranger present.  Code stroke cancelled at 1320 per Dr. Thad Ranger.

## 2011-10-20 NOTE — ED Notes (Signed)
Gena the RN did in and out cath while assist her.

## 2011-10-20 NOTE — ED Notes (Signed)
Pt given Diet Coke & sugar free snack, pt refuses sandwich

## 2011-12-28 ENCOUNTER — Emergency Department (HOSPITAL_COMMUNITY): Payer: BC Managed Care – PPO

## 2011-12-28 ENCOUNTER — Encounter (HOSPITAL_COMMUNITY): Payer: Self-pay

## 2011-12-28 ENCOUNTER — Inpatient Hospital Stay (HOSPITAL_COMMUNITY)
Admission: EM | Admit: 2011-12-28 | Discharge: 2011-12-30 | DRG: 569 | Disposition: A | Discharge status: Nursing Home (Includes ICF) | Payer: BC Managed Care – PPO | Attending: Internal Medicine | Admitting: Internal Medicine

## 2011-12-28 DIAGNOSIS — Z7982 Long term (current) use of aspirin: Secondary | ICD-10-CM

## 2011-12-28 DIAGNOSIS — R509 Fever, unspecified: Secondary | ICD-10-CM

## 2011-12-28 DIAGNOSIS — A498 Other bacterial infections of unspecified site: Secondary | ICD-10-CM | POA: Diagnosis present

## 2011-12-28 DIAGNOSIS — E785 Hyperlipidemia, unspecified: Secondary | ICD-10-CM | POA: Diagnosis present

## 2011-12-28 DIAGNOSIS — F329 Major depressive disorder, single episode, unspecified: Secondary | ICD-10-CM | POA: Diagnosis present

## 2011-12-28 DIAGNOSIS — E876 Hypokalemia: Secondary | ICD-10-CM

## 2011-12-28 DIAGNOSIS — E8881 Metabolic syndrome: Secondary | ICD-10-CM | POA: Diagnosis present

## 2011-12-28 DIAGNOSIS — R197 Diarrhea, unspecified: Secondary | ICD-10-CM | POA: Diagnosis present

## 2011-12-28 DIAGNOSIS — I10 Essential (primary) hypertension: Secondary | ICD-10-CM | POA: Diagnosis present

## 2011-12-28 DIAGNOSIS — R4182 Altered mental status, unspecified: Secondary | ICD-10-CM

## 2011-12-28 DIAGNOSIS — G929 Unspecified toxic encephalopathy: Secondary | ICD-10-CM | POA: Diagnosis present

## 2011-12-28 DIAGNOSIS — G35D Multiple sclerosis, unspecified: Secondary | ICD-10-CM

## 2011-12-28 DIAGNOSIS — R112 Nausea with vomiting, unspecified: Secondary | ICD-10-CM | POA: Diagnosis present

## 2011-12-28 DIAGNOSIS — G9341 Metabolic encephalopathy: Secondary | ICD-10-CM

## 2011-12-28 DIAGNOSIS — G92 Toxic encephalopathy: Secondary | ICD-10-CM | POA: Diagnosis present

## 2011-12-28 DIAGNOSIS — F3289 Other specified depressive episodes: Secondary | ICD-10-CM | POA: Diagnosis present

## 2011-12-28 DIAGNOSIS — Z79899 Other long term (current) drug therapy: Secondary | ICD-10-CM

## 2011-12-28 DIAGNOSIS — G35 Multiple sclerosis: Secondary | ICD-10-CM | POA: Diagnosis present

## 2011-12-28 DIAGNOSIS — E119 Type 2 diabetes mellitus without complications: Secondary | ICD-10-CM

## 2011-12-28 DIAGNOSIS — E559 Vitamin D deficiency, unspecified: Secondary | ICD-10-CM | POA: Diagnosis present

## 2011-12-28 DIAGNOSIS — N10 Acute tubulo-interstitial nephritis: Principal | ICD-10-CM

## 2011-12-28 DIAGNOSIS — F32A Depression, unspecified: Secondary | ICD-10-CM

## 2011-12-28 HISTORY — DX: Family history of other specified conditions: Z84.89

## 2011-12-28 HISTORY — DX: Disease of blood and blood-forming organs, unspecified: D75.9

## 2011-12-28 LAB — GLUCOSE, CAPILLARY
Glucose-Capillary: 78 mg/dL (ref 70–99)
Glucose-Capillary: 125 mg/dL — ABNORMAL HIGH (ref 70–99)

## 2011-12-28 LAB — CSF CELL COUNT WITH DIFFERENTIAL: Tube #: 3

## 2011-12-28 LAB — DIFFERENTIAL
Lymphs Abs: 0.9 10*3/uL (ref 0.7–4.0)
Monocytes Relative: 10 % (ref 3–12)
Neutro Abs: 3.1 10*3/uL (ref 1.7–7.7)
Neutrophils Relative %: 66 % (ref 43–77)

## 2011-12-28 LAB — URINALYSIS, ROUTINE W REFLEX MICROSCOPIC
Bilirubin Urine: NEGATIVE
Ketones, ur: NEGATIVE mg/dL
Nitrite: NEGATIVE
Urobilinogen, UA: 1 mg/dL (ref 0.0–1.0)

## 2011-12-28 LAB — APTT: aPTT: 32 seconds (ref 24–37)

## 2011-12-28 LAB — PROTEIN AND GLUCOSE, CSF: Total  Protein, CSF: 31 mg/dL (ref 15–45)

## 2011-12-28 LAB — POCT I-STAT, CHEM 8
BUN: 11 mg/dL (ref 6–23)
Chloride: 105 mEq/L (ref 96–112)
Creatinine, Ser: 0.7 mg/dL (ref 0.50–1.10)
Sodium: 142 mEq/L (ref 135–145)
TCO2: 24 mmol/L (ref 0–100)

## 2011-12-28 LAB — PROTIME-INR: INR: 1 (ref 0.00–1.49)

## 2011-12-28 LAB — COMPREHENSIVE METABOLIC PANEL
Albumin: 3.6 g/dL (ref 3.5–5.2)
Alkaline Phosphatase: 52 U/L (ref 39–117)
BUN: 11 mg/dL (ref 6–23)
Chloride: 104 mEq/L (ref 96–112)
Glucose, Bld: 157 mg/dL — ABNORMAL HIGH (ref 70–99)
Potassium: 3.9 mEq/L (ref 3.5–5.1)
Total Bilirubin: 0.2 mg/dL — ABNORMAL LOW (ref 0.3–1.2)

## 2011-12-28 LAB — GRAM STAIN

## 2011-12-28 LAB — CBC
Hemoglobin: 11.7 g/dL — ABNORMAL LOW (ref 12.0–15.0)
RBC: 4.67 MIL/uL (ref 3.87–5.11)

## 2011-12-28 LAB — CK TOTAL AND CKMB (NOT AT ARMC): Total CK: 66 U/L (ref 7–177)

## 2011-12-28 MED ORDER — SIMVASTATIN 5 MG PO TABS
5.0000 mg | ORAL_TABLET | Freq: Every day | ORAL | Status: DC
  Administered 2011-12-29: 5 mg via ORAL
  Filled 2011-12-28 (×2): qty 1

## 2011-12-28 MED ORDER — LIDOCAINE-EPINEPHRINE 1 %-1:100000 IJ SOLN: 20.0000 mL | Freq: Once | INTRAMUSCULAR | Status: DC

## 2011-12-28 MED ORDER — DEXTROSE 50 % IV SOLN
INTRAVENOUS | Status: AC
  Filled 2011-12-28: qty 50

## 2011-12-28 MED ORDER — OXYBUTYNIN CHLORIDE ER 15 MG PO TB24
15.0000 mg | ORAL_TABLET | Freq: Every day | ORAL | Status: DC
  Administered 2011-12-29 – 2011-12-30 (×2): 15 mg via ORAL
  Filled 2011-12-28 (×2): qty 1

## 2011-12-28 MED ORDER — PIPERACILLIN-TAZOBACTAM 3.375 G IVPB
3.3750 g | Freq: Three times a day (TID) | INTRAVENOUS | Status: DC
  Administered 2011-12-28 – 2011-12-30 (×5): 3.375 g via INTRAVENOUS
  Filled 2011-12-28 (×8): qty 50

## 2011-12-28 MED ORDER — CLONIDINE HCL 0.1 MG PO TABS
0.1000 mg | ORAL_TABLET | Freq: Every day | ORAL | Status: DC
  Administered 2011-12-29 – 2011-12-30 (×2): 0.1 mg via ORAL
  Filled 2011-12-28 (×2): qty 1

## 2011-12-28 MED ORDER — DOCUSATE SODIUM 100 MG PO CAPS
100.0000 mg | ORAL_CAPSULE | Freq: Two times a day (BID) | ORAL | Status: DC
  Administered 2011-12-29 – 2011-12-30 (×3): 100 mg via ORAL
  Filled 2011-12-28 (×5): qty 1

## 2011-12-28 MED ORDER — PIPERACILLIN-TAZOBACTAM 3.375 G IVPB
3.3750 g | Freq: Once | INTRAVENOUS | Status: AC
  Administered 2011-12-28: 3.375 g via INTRAVENOUS
  Filled 2011-12-28: qty 50

## 2011-12-28 MED ORDER — HEPARIN SODIUM (PORCINE) 5000 UNIT/ML IJ SOLN
5000.0000 [IU] | Freq: Three times a day (TID) | INTRAMUSCULAR | Status: DC
  Administered 2011-12-28 – 2011-12-30 (×5): 5000 [IU] via SUBCUTANEOUS
  Filled 2011-12-28 (×8): qty 1

## 2011-12-28 MED ORDER — ALBUTEROL SULFATE (5 MG/ML) 0.5% IN NEBU: 2.5000 mg | INHALATION_SOLUTION | RESPIRATORY_TRACT | Status: DC | PRN

## 2011-12-28 MED ORDER — VANCOMYCIN HCL IN DEXTROSE 1-5 GM/200ML-% IV SOLN
1000.0000 mg | Freq: Once | INTRAVENOUS | Status: AC
  Administered 2011-12-28: 1000 mg via INTRAVENOUS
  Filled 2011-12-28: qty 200

## 2011-12-28 MED ORDER — SODIUM CHLORIDE 0.9 % IV SOLN: INTRAVENOUS | Status: DC

## 2011-12-28 MED ORDER — ACETAMINOPHEN 650 MG RE SUPP
650.0000 mg | Freq: Once | RECTAL | Status: AC
  Administered 2011-12-28: 650 mg via RECTAL
  Filled 2011-12-28: qty 1

## 2011-12-28 MED ORDER — ASPIRIN 81 MG PO CHEW
81.0000 mg | CHEWABLE_TABLET | Freq: Every day | ORAL | Status: DC
  Administered 2011-12-29 – 2011-12-30 (×2): 81 mg via ORAL
  Filled 2011-12-28 (×2): qty 1

## 2011-12-28 MED ORDER — SODIUM CHLORIDE 0.45 % IV SOLN
Freq: Once | INTRAVENOUS | Status: AC
  Administered 2011-12-28: 20:00:00 via INTRAVENOUS

## 2011-12-28 MED ORDER — METOPROLOL TARTRATE 1 MG/ML IV SOLN
2.5000 mg | Freq: Once | INTRAVENOUS | Status: AC
  Administered 2011-12-28: 2.5 mg via INTRAVENOUS
  Filled 2011-12-28: qty 5

## 2011-12-28 MED ORDER — LISINOPRIL 20 MG PO TABS
20.0000 mg | ORAL_TABLET | Freq: Every day | ORAL | Status: DC
Start: 2011-12-29 — End: 2011-12-30
  Administered 2011-12-29 – 2011-12-30 (×2): 20 mg via ORAL
  Filled 2011-12-28 (×2): qty 1

## 2011-12-28 MED ORDER — SENNOSIDES-DOCUSATE SODIUM 8.6-50 MG PO TABS
2.0000 | ORAL_TABLET | Freq: Every day | ORAL | Status: DC
  Administered 2011-12-29: 2 via ORAL
  Filled 2011-12-28: qty 2

## 2011-12-28 MED ORDER — VANCOMYCIN HCL 1000 MG IV SOLR
750.0000 mg | Freq: Two times a day (BID) | INTRAVENOUS | Status: DC
  Administered 2011-12-29: 750 mg via INTRAVENOUS
  Filled 2011-12-28 (×3): qty 750

## 2011-12-28 MED ORDER — INSULIN ASPART 100 UNIT/ML ~~LOC~~ SOLN
0.0000 [IU] | Freq: Three times a day (TID) | SUBCUTANEOUS | Status: DC
  Administered 2011-12-29: 2 [IU] via SUBCUTANEOUS

## 2011-12-28 MED ORDER — DEXTROSE 5 % IV SOLN
2.0000 g | Freq: Two times a day (BID) | INTRAVENOUS | Status: DC
  Filled 2011-12-28: qty 2

## 2011-12-28 NOTE — Code Documentation (Signed)
56 year old female presented to ED via EMS with sx garbled speech.  LSW at 1430 in [place] - was alert and speaking normal.  At 1445 staff noticed change in LOC and garbled speech.  Patient has MS history with left sided weakness at baseline reported by staff there.  EMS arrival her blood sugar was 62 - gave patient 1/2 amp D50 with no improvement in mental status. Code stroke called at 1510 with ETA 6 mins.  Patient arrived to ED at 1517.  Stroke team arrival at 1515.  Lab here at 1515.  LSW 1430.  EDP exam with Neurohospitialist at 1517.  CT read at 1530 by stroke MD.  NIHSS 7.  Patient drowsy with dysarthria - bil LE with spasticity -  see flow sheet for full NIHSS.  Repeat CBG 72.  Patient with altered mental status - c/o chills, fatigue, is ST at 114-120, tachypnea at 24-28 - skin hot to touch.  Staff to get rectal temp.  Code stroke cancelled at 1545.

## 2011-12-28 NOTE — ED Notes (Signed)
Urine collected 16:32

## 2011-12-28 NOTE — ED Notes (Signed)
Pt arrived at 1517, straight to CT scan at 1520, code stoke ended at 3

## 2011-12-28 NOTE — Progress Notes (Signed)
ANTIBIOTIC CONSULT NOTE - INITIAL  Pharmacy Consult for vancomycin and zosyn Indication: HCAP  No Known Allergies  Patient Measurements: Height: 5' 4.96" (165 cm) Weight: 136 lb 0.4 oz (61.7 kg) IBW/kg (Calculated) : 56.91    Vital Signs: Temp: 103.2 F (39.6 C) (08/13 1634) Temp src: Rectal (08/13 1634) BP: 186/115 mmHg (08/13 1634) Pulse Rate: 106  (08/13 1634) Intake/Output from previous day:   Intake/Output from this shift:    Labs:  Basename 12/28/11 1535 12/28/11 1525  WBC -- 4.7  HGB 12.6 11.7*  PLT -- 180  LABCREA -- --  CREATININE 0.70 0.63   Estimated Creatinine Clearance: 70.5 ml/min (by C-G formula based on Cr of 0.7). No results found for this basename: VANCOTROUGH:2,VANCOPEAK:2,VANCORANDOM:2,GENTTROUGH:2,GENTPEAK:2,GENTRANDOM:2,TOBRATROUGH:2,TOBRAPEAK:2,TOBRARND:2,AMIKACINPEAK:2,AMIKACINTROU:2,AMIKACIN:2, in the last 72 hours   Microbiology: No results found for this or any previous visit (from the past 720 hour(s)).  Medical History: Past Medical History  Diagnosis Date  . Allergic rhinitis   . Diabetes mellitus   . Hypertension   . Hypokalemia   . Depression   . Multiple sclerosis   . Vitamin d deficiency   . Dysmetabolic syndrome X   . Urinary incontinence   . Neuropathic pain     Medications:   (Not in a hospital admission) Assessment: 56 yo lady from a nursing home admitted with acute mental status changes and acute onset of slurred speech.  Her temp is 103.2 and her wbc is 4.7. Her creat is 0.7.  Weight = 61.7 kg.  She is to start vancomycin and zosyn for HCAP.  Her CXR is unremarkable but there is concern for possible aspiration with her sudden onset dysarthria.  Goal of Therapy:  Vancomycin trough level 15-20 mcg/ml  Plan:  1. Vancomycin 1000 mg loading dose in the ED, then vancomycin 750 mg IV q12h 2. Zosyn 3.375 grams IV q8h with each dose infused over 4 hours. 3. F/u renal function and culture data. Herby Abraham,  Pharm.D. [phone] 12/28/2011 6:31 PM

## 2011-12-28 NOTE — Consult Note (Signed)
Reason for Consult: Possible stroke Referring Physician: Dana Allan  CC: Dysarthria  HPI: [name] is an 56 y.o. female who was in her normal state of health until approximately 2:30 PM today. A worker at her facility saw her at 2:30 PM left the room for a few minutes, and when he came back she was slurring her speech. She states that this is similar to what happened to her 2 months ago.  She also states that she had seizures in the past.  Past Medical History  Diagnosis Date  . Allergic rhinitis   . Diabetes mellitus   . Hypertension   . Hypokalemia   . Depression   . Multiple sclerosis   . Vitamin d deficiency   . Dysmetabolic syndrome X   . Urinary incontinence   . Neuropathic pain     Past Surgical History  Procedure Date  . Cesarean section     History reviewed. No pertinent family history.  Social History:  reports that she has never smoked. She has never used smokeless tobacco. She reports that she does not drink alcohol or use illicit drugs.  No Known Allergies  Medications:     . acetaminophen  650 mg Rectal Once  . dextrose      . piperacillin-tazobactam (ZOSYN)  IV  3.375 g Intravenous Once  . vancomycin  1,000 mg Intravenous Once    ROS: Review of systems unobtainable due to dysarthria  Physical Examination: Blood pressure 186/115, pulse 106, temperature 103.2 F (39.6 C), temperature source Rectal, resp. rate 26, SpO2 100.00%.  Gen.: Appears in mild distress, feels febrile Neurologic Examination Mental status: Patient is oriented to place, month, age. She responds to questions and commands immediately, though it is difficult to understand her answers. Speech/language: Patient has severely dysarthric speech Cranial nerves: II: visual fields grossly normal, pupils equal, round, reactive to light III,IV, VI: extra-ocular motions intact bilaterally  V,VII: Patient is able to move her face on both sides, facial light touch sensation normal  bilaterally  VIII: hearing normal bilaterally  IX,X: gag reflex present, uvula elevates symmetrically XI: trapezius strength/neck flexion strength normal bilaterally  XII: tongue strength normal  Motor:  Right : Upper extremity 5/5 Left: Upper extremity 5/5  Lower extremity able to flex with 4/5 strength at the hip, but unable to left ankle off of bed. This seems symmetric. Tone and bulk: She has significant spasticity in the lower extremities bilaterally bulk is very slightly decreased in the calves bilaterally Sensory: Patient reports symmetric to light touch sensation. Deep Tendon Reflexes: 2+ in the upper extremities, absent in the lower extremities, no clonus Plantars:  Right: equivocal Left: equivocal  Cerebellar:  Mild tremor on finger-nose-finger bilaterally   Laboratory Studies:   Basic Metabolic Panel:  Lab 12/28/11 1610 12/28/11 1525  NA 142 140  K 3.8 3.9  CL 105 104  CO2 -- 25  GLUCOSE 149* 157*  BUN 11 11  CREATININE 0.70 0.63  CALCIUM -- 9.5  MG -- --  PHOS -- --    Liver Function Tests:  Lab 12/28/11 1525  AST 18  ALT 10  ALKPHOS 52  BILITOT 0.2*  PROT 7.7  ALBUMIN 3.6   No results found for this basename: LIPASE:5,AMYLASE:5 in the last 168 hours No results found for this basename: AMMONIA:3 in the last 168 hours  CBC:  Lab 12/28/11 1535 12/28/11 1525  WBC -- 4.7  NEUTROABS -- 3.1  HGB 12.6 11.7*  HCT 37.0 36.6  MCV --  78.4  PLT -- 180    Cardiac Enzymes:  Lab 12/28/11 1526  CKTOTAL 66  CKMB 2.0  CKMBINDEX --  TROPONINI <0.30    BNP: No components found with this basename: POCBNP:5  CBG:  Lab 12/28/11 1607  GLUCAP 78    Microbiology: Results for orders placed during the hospital encounter of 04/29/11  URINE CULTURE     Status: Normal   Collection Time   04/29/11  3:41 PM      Component Value Range Status Comment   Specimen Description URINE, CATHETERIZED   Final    Special Requests NONE   Final    Culture  Setup Time  [id number]   Final    Colony Count NO GROWTH   Final    Culture NO GROWTH   Final    Report Status 04/30/2011 FINAL   Final     Coagulation Studies:  Basename 12/28/11 1525  LABPROT 13.4  INR 1.00    Urinalysis:  Lab 12/28/11 1634  COLORURINE YELLOW  LABSPEC 1.011  PHURINE 7.5  GLUCOSEU NEGATIVE  HGBUR NEGATIVE  BILIRUBINUR NEGATIVE  KETONESUR NEGATIVE  PROTEINUR NEGATIVE  UROBILINOGEN 1.0  NITRITE NEGATIVE  LEUKOCYTESUR NEGATIVE    Lipid Panel:  No results found for this basename: chol, trig, hdl, cholhdl, vldl, ldlcalc    HgbA1C:  Lab Results  Component Value Date   HGBA1C  Value: 5.8 (NOTE)   The ADA recommends the following therapeutic goals for glycemic   control related to Hgb A1C measurement:   Goal of Therapy:   < 7.0% Hgb A1C   Action Suggested:  > 8.0% Hgb A1C   Ref:  Diabetes Care, 22, Suppl. 1, 1999 12/16/2006    Urine Drug Screen:     Component Value Date/Time   LABOPIA NONE DETECTED 12/14/2006 0509   COCAINSCRNUR POSITIVE* 12/14/2006 0509   LABBENZ NONE DETECTED 12/14/2006 0509   AMPHETMU NONE DETECTED 12/14/2006 0509   THCU NONE DETECTED 12/14/2006 0509   LABBARB  Value: NONE DETECTED        DRUG SCREEN FOR MEDICAL PURPOSES ONLY.  IF CONFIRMATION IS NEEDED FOR ANY PURPOSE, NOTIFY LAB WITHIN 5 DAYS. 12/14/2006 0509    Alcohol Level: No results found for this basename: ETH:2 in the last 168 hours   Imaging: Ct Head Wo Contrast  12/28/2011  *RADIOLOGY REPORT*  Clinical Data: Code stroke.  History of multiple sclerosis.  Left- sided weakness.  CT HEAD WITHOUT CONTRAST  Technique:  Contiguous axial images were obtained from the base of the skull through the vertex without contrast.  Comparison: Head CT 10/20/2011.  Findings: Cerebral and cerebellar atrophy is age advanced. Extensive patchy and confluent areas of decreased attenuation throughout the deep and periventricular white matter of the cerebral hemispheres bilaterally, which likely reflect multifocal  white matter lesions related to the patient's reported history of multiple sclerosis; alternatively, some of these findings may be related to chronic microvascular ischemic disease of the white matter.  No definite acute intracranial finding.  Specifically, no definite evidence of acute/subacute cerebral ischemia, no acute intracranial hemorrhage, no focal mass, mass effect, hydrocephalus or abnormal intra or extra-axial fluid collections.  No acute displaced skull fractures are identified.  Visualized paranasal sinuses and mastoids are well pneumatized.  IMPRESSION: 1.  No acute intracranial abnormalities. 2.  Cerebral and cerebellar atrophy with chronic white matter changes, similar to prior examinations, as detailed above.  These results were called by telephone on 12/28/2011 at 03:40 p.m. to Dr. Rubin Payor, who verbally  acknowledged these results.  Original Report Authenticated By: Florencia Reasons, M.D.     Assessment/Plan:  56 year old female with her second episode of dysarthria that began relatively acutely. In the setting of her febrile illness, this is certainly possible to be a worsening deficit in the setting of infection that is often seen with MS. It is a little concerning, how similar this is to her previous episode and how suddenly it came on. It would be worthwhile, to obtain an EEG to further evaluate for possible seizure.  1) EEG in the a.m. 2) workup for infectious source given fever of 103 3) If patient were to have seizure activity, would load with keppra 1000 mg IV and start keppra 500mg  BID.   Ritta Slot, MD [facility] [phone] 12/28/2011, 5:30 PM

## 2011-12-28 NOTE — H&P (Signed)
[facility]                                                                                    Patient Demographics  [name], is a 56 y.o. female  CSN: [id number]  MRN: [id number]  DOB - 10/05/1955  Admit Date - 12/28/2011  Outpatient Primary MD for the patient is Florentina Jenny, MD   With History of -  Past Medical History  Diagnosis Date  . Allergic rhinitis   . Diabetes mellitus   . Hypertension   . Hypokalemia   . Depression   . Multiple sclerosis   . Vitamin d deficiency   . Dysmetabolic syndrome X   . Urinary incontinence   . Neuropathic pain       Past Surgical History  Procedure Date  . Cesarean section     in for   Chief Complaint  Patient presents with  . Code Stroke     HPI  [name]  is a 56 y.o. female, with a past medical history significant for multiple sclerosis on Avonex and was also a nursing home resident, presenting today with acute change in mental status noted by the staff the patient today had nausea vomiting and diarrhea. In the emergency room the patient was noted to be febrile with a temperature of 103 rectally patient was confused and she couldn't give me any history . Neurology saw the patient in the emergency room . However at that time the patient was not febrile . In the ER workup was negative for urinary tract infection, she had no leukocytosis, chest x-ray  had no significant infiltrate. History was provided by the ER staff.   Review of Systems    Patient was unable to provide any history due to mental status change.   Social History History  Substance Use Topics  . Smoking status: Never Smoker   . Smokeless tobacco: Never Used  . Alcohol Use: No    Family History History reviewed. No pertinent family history. Unobtainable  Prior to Admission medications   Medication Sig Start Date End Date Taking? Authorizing Provider  aspirin 81 MG chewable tablet Chew 81 mg by mouth daily.     Yes Historical  Provider, MD  cholecalciferol (VITAMIN D) 1000 UNITS tablet Take 1,000 Units by mouth daily.     Yes Historical Provider, MD  cloNIDine (CATAPRES) 0.1 MG tablet Take 0.1 mg by mouth daily.    Yes Historical Provider, MD  Cranberry 400 MG CAPS Take 1 capsule by mouth daily.     Yes Historical Provider, MD  ferrous sulfate 325 (65 FE) MG tablet Take 325 mg by mouth daily with breakfast.     Yes Historical Provider, MD  gabapentin (NEURONTIN) 300 MG capsule Take 300 mg by mouth every 12 (twelve) hours.     Yes Historical Provider, MD  ibuprofen (ADVIL,MOTRIN) 800 MG tablet Take 800 mg by mouth every 8 (eight) hours as needed. For pain   Yes Historical Provider, MD  interferon beta-1a (AVONEX PEN) 30 MCG/0.5ML injection Inject 30 mcg into the muscle every 7 (seven) days.     Yes Historical Provider, MD  lisinopril (PRINIVIL,ZESTRIL)  20 MG tablet Take 20 mg by mouth daily.   Yes Historical Provider, MD  metFORMIN (GLUCOPHAGE) 500 MG tablet Take 500 mg by mouth 2 (two) times daily with a meal.     Yes Historical Provider, MD  oxybutynin (DITROPAN XL) 15 MG 24 hr tablet Take 15 mg by mouth daily.     Yes Historical Provider, MD  pravastatin (PRAVACHOL) 20 MG tablet Take 20 mg by mouth every evening.     Yes Historical Provider, MD  senna-docusate (SENNA PLUS) 8.6-50 MG per tablet Take 2 tablets by mouth at bedtime.   Yes Historical Provider, MD    No Known Allergies  Physical Exam  Vitals  Blood pressure 186/115, pulse 106, temperature 103.2 F (39.6 C), temperature source Rectal, resp. rate 26, height 5' 4.96" (1.65 m), weight 136 lb 0.4 oz (61.7 kg), SpO2 100.00%.   1. General African American female lying in bed, confused.  2. Normal affect and insight, Not Suicidal or Homicidal,   3. diffusely weak however her motor power could not be examined due to her change in mental status.  4. Ears and Eyes appear Normal, Conjunctivae clear, PERRLA. Moist Oral Mucosa.  5. Supple Neck, No JVD, No  cervical lymphadenopathy appriciated, No Carotid Bruits.  6. Symmetrical Chest wall movement, Good air movement bilaterally, decreased breath sounds at bases  7. RRR, No Gallops, Rubs or Murmurs, No Parasternal Heave tachycardic.  8. Positive Bowel Sounds, Abdomen Soft, Non tender, No organomegaly appriciated,No rebound -guarding or rigidity.  9.  No Cyanosis, Normal Skin Turgor, No Skin Rash or Bruise.  10. Good muscle tone,  joints appear normal , no effusions, Normal ROM.  11. No Palpable Lymph Nodes in Neck or Axillae   Data Review  CBC  Lab 12/28/11 1535 12/28/11 1525  WBC -- 4.7  HGB 12.6 11.7*  HCT 37.0 36.6  PLT -- 180  MCV -- 78.4  MCH -- 25.1*  MCHC -- 32.0  RDW -- 14.0  LYMPHSABS -- 0.9  MONOABS -- 0.5  EOSABS -- 0.2  BASOSABS -- 0.0  BANDABS -- --   ------------------------------------------------------------------------------------------------------------------  Chemistries   Lab 12/28/11 1535 12/28/11 1525  NA 142 140  K 3.8 3.9  CL 105 104  CO2 -- 25  GLUCOSE 149* 157*  BUN 11 11  CREATININE 0.70 0.63  CALCIUM -- 9.5  MG -- --  AST -- 18  ALT -- 10  ALKPHOS -- 52  BILITOT -- 0.2*   ------------------------------------------------------------------------------------------------------------------ estimated creatinine clearance is 70.5 ml/min (by C-G formula based on Cr of 0.7). ------------------------------------------------------------------------------------------------------------------ No results found for this basename: TSH,T4TOTAL,FREET3,T3FREE,THYROIDAB in the last 72 hours   Coagulation profile  Lab 12/28/11 1525  INR 1.00  PROTIME --   ------------------------------------------------------------------------------------------------------------------- No results found for this basename: DDIMER:2 in the last 72  hours -------------------------------------------------------------------------------------------------------------------  Cardiac Enzymes  Lab 12/28/11 1526  CKMB 2.0  TROPONINI <0.30  MYOGLOBIN --   ------------------------------------------------------------------------------------------------------------------ No components found with this basename: POCBNP:3   ---------------------------------------------------------------------------------------------------------------  Urinalysis    Component Value Date/Time   COLORURINE YELLOW 12/28/2011 1634   APPEARANCEUR HAZY* 12/28/2011 1634   LABSPEC 1.011 12/28/2011 1634   PHURINE 7.5 12/28/2011 1634   GLUCOSEU NEGATIVE 12/28/2011 1634   HGBUR NEGATIVE 12/28/2011 1634   BILIRUBINUR NEGATIVE 12/28/2011 1634   KETONESUR NEGATIVE 12/28/2011 1634   PROTEINUR NEGATIVE 12/28/2011 1634   UROBILINOGEN 1.0 12/28/2011 1634   NITRITE NEGATIVE 12/28/2011 1634   LEUKOCYTESUR NEGATIVE 12/28/2011 1634  Imaging results:   Dg Chest 2 View  12/28/2011  *RADIOLOGY REPORT*  Clinical Data: Altered mental status  CHEST - 2 VIEW  Comparison: 10/20/2011  Findings: Stable elevation of left diaphragmatic leaflet.  Low lung volumes.  Perihilar and bibasilar interstitial prominence without confluent airspace infiltrate or overt edema.  No definite effusion.  Heart size within normal limits.  Regional bones unremarkable.  IMPRESSION:  1.  Low volumes without acute or superimposed abnormality.  Original Report Authenticated By: Osa Craver, M.D.   Ct Head Wo Contrast  12/28/2011  *RADIOLOGY REPORT*  Clinical Data: Code stroke.  History of multiple sclerosis.  Left- sided weakness.  CT HEAD WITHOUT CONTRAST  Technique:  Contiguous axial images were obtained from the base of the skull through the vertex without contrast.  Comparison: Head CT 10/20/2011.  Findings: Cerebral and cerebellar atrophy is age advanced. Extensive patchy and confluent areas of  decreased attenuation throughout the deep and periventricular white matter of the cerebral hemispheres bilaterally, which likely reflect multifocal white matter lesions related to the patient's reported history of multiple sclerosis; alternatively, some of these findings may be related to chronic microvascular ischemic disease of the white matter.  No definite acute intracranial finding.  Specifically, no definite evidence of acute/subacute cerebral ischemia, no acute intracranial hemorrhage, no focal mass, mass effect, hydrocephalus or abnormal intra or extra-axial fluid collections.  No acute displaced skull fractures are identified.  Visualized paranasal sinuses and mastoids are well pneumatized.  IMPRESSION: 1.  No acute intracranial abnormalities. 2.  Cerebral and cerebellar atrophy with chronic white matter changes, similar to prior examinations, as detailed above.  These results were called by telephone on 12/28/2011 at 03:40 p.m. to Dr. Rubin Payor, who verbally acknowledged these results.  Original Report Authenticated By: Florencia Reasons, M.D.      Assessment & Plan  Principal Problem:  *Mental status, decreased Active Problems:  MS (multiple sclerosis)  Fever  DM2 (diabetes mellitus, type 2)    1. fever and decreased mental status on a patient with history of multiple sclerosis immunocompromised.L P. Is being done in the ER upon my request - . Need to check . Patient already started on Vanco and Zosyn in the emergency room and Rocephin as well. Please check the CSF results and start a cycle via IV if needed.   #2 nausea vomiting and diarrhea C. difficile and stool cultures ordered. Possible aspiration although chest x-ray is negative. Aspiration precautions, speech therapy input is requested, antibiotics as above. N.p.o. for now -diet to be a advanced nursing staff as tolerated   #3 multiple sclerosis-immunocompromised, hold Avonex . Patient is bedbound out patient PT advised at the  nursing home .    #4 diabetes mellitus-I held her metformin and started on insulin sliding scale     #5 hyperlipidemia and hypertension continue home meds.      DVT Prophylaxis Heparin   AM Labs Ordered, also please review Full Orders    Code Status full  Disposition Plan: Nursing home  Time spent in minutes : 35  Condition GUARDED

## 2011-12-28 NOTE — ED Notes (Signed)
Report received, assumed care.  

## 2011-12-28 NOTE — ED Notes (Signed)
Glucose 78

## 2011-12-28 NOTE — ED Notes (Signed)
Pt was seen at nursing home well at 1430, she started with slurred speech and EMS was called.

## 2011-12-28 NOTE — ED Notes (Signed)
Patient transported to X-ray 

## 2011-12-28 NOTE — ED Provider Notes (Addendum)
History     CSN: [id number]  Arrival date & time 12/28/11  1520   First MD Initiated Contact with Patient 12/28/11 1521      No chief complaint on file.   (Consider location/radiation/quality/duration/timing/severity/associated sxs/prior treatment) HPI Comments: Pt presents via EMS from a nursing home.  Reported to have an acute change in mental status and acute onset muffled speech.  She was last seen normal at ~14:10.  She states that she is unsure why she is not feeling well.  The history is provided by the patient and the EMS personnel. The history is limited by the condition of the patient.    Past Medical History  Diagnosis Date  . Allergic rhinitis   . Diabetes mellitus   . Hypertension   . Hypokalemia   . Depression   . Multiple sclerosis   . Vitamin d deficiency   . Dysmetabolic syndrome X   . Urinary incontinence   . Neuropathic pain     Past Surgical History  Procedure Date  . Cesarean section     No family history on file.  History  Substance Use Topics  . Smoking status: Never Smoker   . Smokeless tobacco: Never Used  . Alcohol Use: No    OB History    Grav Para Term Preterm Abortions TAB SAB Ect Mult Living                  Review of Systems  Constitutional: Positive for chills and fatigue. Negative for fever and diaphoresis.  HENT: Positive for voice change. Negative for hearing loss, sore throat, facial swelling, rhinorrhea, drooling, trouble swallowing, neck pain, neck stiffness, sinus pressure and tinnitus.   Eyes: Negative for pain, discharge, redness and visual disturbance.  Respiratory: Negative for cough, choking, chest tightness, shortness of breath and wheezing.   Cardiovascular: Negative for chest pain, palpitations and leg swelling.  Gastrointestinal: Negative.   Genitourinary: Negative for dysuria and urgency.  Musculoskeletal: Positive for myalgias and gait problem. Negative for joint swelling.       Chronic  Skin: Negative for  color change, pallor and wound.  Neurological: Positive for speech difficulty and weakness. Negative for dizziness, seizures, syncope, light-headedness and headaches.  Psychiatric/Behavioral: Positive for confusion.    Allergies  Review of patient's allergies indicates no known allergies.  Home Medications   Current Outpatient Rx  Name Route Sig Dispense Refill  . ASPIRIN 81 MG PO CHEW Oral Chew 81 mg by mouth daily.      Marland Kitchen VITAMIN D 1000 UNITS PO TABS Oral Take 1,000 Units by mouth daily.      Marland Kitchen CLONIDINE HCL 0.1 MG PO TABS Oral Take 0.1 mg by mouth daily.     Marland Kitchen CRANBERRY 400 MG PO CAPS Oral Take 1 capsule by mouth daily.      Marland Kitchen FERROUS SULFATE 325 (65 FE) MG PO TABS Oral Take 325 mg by mouth daily with breakfast.      . GABAPENTIN 300 MG PO CAPS Oral Take 300 mg by mouth every 12 (twelve) hours.      . IBUPROFEN 800 MG PO TABS Oral Take 800 mg by mouth every 8 (eight) hours as needed. For pain    . INTERFERON BETA-1A 30 MCG/0.5ML IM KIT Intramuscular Inject 30 mcg into the muscle every 7 (seven) days.      Marland Kitchen LISINOPRIL 10 MG PO TABS Oral Take 10 mg by mouth daily.      Marland Kitchen METFORMIN HCL 500 MG PO  TABS Oral Take 500 mg by mouth 2 (two) times daily with a meal.      . MICONAZOLE NITRATE 2 % EX CREA Topical Apply 1 application topically daily as needed. For buttock redness/breakdown     . OXYBUTYNIN CHLORIDE ER 15 MG PO TB24 Oral Take 15 mg by mouth daily.      Marland Kitchen PRAVASTATIN SODIUM 20 MG PO TABS Oral Take 20 mg by mouth every evening.        There were no vitals taken for this visit.  Physical Exam  Nursing note and vitals reviewed. Constitutional: She appears well-developed and well-nourished. No distress.  HENT:  Head: Normocephalic and atraumatic. Not macrocephalic and not microcephalic. Head is without Battle's sign, without abrasion and without right periorbital erythema. Hair is normal. No trismus in the jaw.  Right Ear: Hearing, tympanic membrane, external ear and ear canal  normal.  Left Ear: Hearing and external ear normal.  Nose: No mucosal edema, rhinorrhea, nasal deformity or nasal septal hematoma. No epistaxis.  Mouth/Throat: Oropharynx is clear and moist and mucous membranes are normal. Mucous membranes are not pale, not dry and not cyanotic. No oral lesions. Abnormal dentition. No uvula swelling or lacerations. No oropharyngeal exudate, posterior oropharyngeal edema, posterior oropharyngeal erythema or tonsillar abscesses.       Intact gag, posterior pharynx symmetric  Eyes: Conjunctivae and EOM are normal. Pupils are equal, round, and reactive to light. Right eye exhibits no discharge. Left eye exhibits no discharge. No scleral icterus. Right eye exhibits no nystagmus. Left eye exhibits no nystagmus.  Neck: Normal range of motion. Neck supple. Normal carotid pulses and no JVD present. Carotid bruit is not present. No tracheal deviation present. No thyromegaly present.  Cardiovascular: Normal rate, regular rhythm, S1 normal, S2 normal, normal heart sounds, intact distal pulses and normal pulses.  Exam reveals no gallop, no friction rub and no decreased pulses.   No murmur heard. Pulmonary/Chest: Effort normal and breath sounds normal. No stridor. No respiratory distress. She has no wheezes. She has no rales. She exhibits no tenderness.  Abdominal: Soft. Bowel sounds are normal. She exhibits no distension and no mass. There is no tenderness. There is no rebound and no guarding.  Musculoskeletal: She exhibits edema. She exhibits no tenderness.  Lymphadenopathy:    She has no cervical adenopathy.  Neurological: She is alert. She is not disoriented. She displays no tremor. A sensory deficit is present. No cranial nerve deficit. She exhibits abnormal muscle tone. She displays no seizure activity. GCS eye subscore is 4. GCS verbal subscore is 5. GCS motor subscore is 6. She displays no Babinski's sign on the right side. She displays no Babinski's sign on the left side.         Diminished lower extremity sensation  Skin: Skin is warm and dry. No bruising, no laceration and no rash noted. She is not diaphoretic. No erythema.  Psychiatric: Thought content normal. Her mood appears anxious. Her speech is delayed and slurred. She is slowed. She is not agitated, not aggressive, is not hyperactive, not withdrawn, not actively hallucinating and not combative. Cognition and memory are not impaired. She does not express impulsivity or inappropriate judgment. She exhibits a depressed mood. She exhibits normal recent memory and normal remote memory. She is inattentive.    ED Course  LUMBAR PUNCTURE Date/Time: 12/28/2011 9:05 PM Performed by: Dana Allan T Authorized by: Dana Allan T Consent: Verbal consent obtained. Written consent obtained. The procedure was performed in an emergent situation.  Risks and benefits: risks, benefits and alternatives were discussed Consent given by: patient (pt hs some dementia, administrative consent obtained) Patient understanding: patient states understanding of the procedure being performed Patient consent: the patient's understanding of the procedure matches consent given (pt demonstrates a poor understanding of specifics of procedure, administrative consent obtained) Procedure consent: procedure consent matches procedure scheduled Relevant documents: relevant documents present and verified Test results: test results available and properly labeled Site marked: the operative site was marked Imaging studies: imaging studies not available Required items: required blood products, implants, devices, and special equipment available Indications: evaluation for infection and evaluation for altered mental status Anesthesia: local infiltration Local anesthetic: lidocaine 1% without epinephrine Anesthetic total: 3 ml Preparation: Patient was prepped and draped in the usual sterile fashion. Lumbar space: L3-L4 interspace Patient's  position: left lateral decubitus Needle type: spinal needle - Quincke tip Needle length: 3.5 in Number of attempts: 4 Fluid appearance: blood-tinged then clearing Tubes of fluid: 4 Total volume: 6 ml Post-procedure: site cleaned and adhesive bandage applied Patient tolerance: Patient tolerated the procedure well with no immediate complications. Comments: Resident B Beaver attempted twice under my direction and supervision but was unsuccessful.  Pt was repositioned and I was able to obtain sample on my 2nd attempt.     (including critical care time)  Labs Reviewed  POCT I-STAT, CHEM 8 - Abnormal; Notable for the following:    Glucose, Bld 149 (*)     All other components within normal limits  PROTIME-INR  APTT  CBC  DIFFERENTIAL  COMPREHENSIVE METABOLIC PANEL  CK TOTAL AND CKMB  TROPONIN I   Ct Head Wo Contrast  12/28/2011  *RADIOLOGY REPORT*  Clinical Data: Code stroke.  History of multiple sclerosis.  Left- sided weakness.  CT HEAD WITHOUT CONTRAST  Technique:  Contiguous axial images were obtained from the base of the skull through the vertex without contrast.  Comparison: Head CT 10/20/2011.  Findings: Cerebral and cerebellar atrophy is age advanced. Extensive patchy and confluent areas of decreased attenuation throughout the deep and periventricular white matter of the cerebral hemispheres bilaterally, which likely reflect multifocal white matter lesions related to the patient's reported history of multiple sclerosis; alternatively, some of these findings may be related to chronic microvascular ischemic disease of the white matter.  No definite acute intracranial finding.  Specifically, no definite evidence of acute/subacute cerebral ischemia, no acute intracranial hemorrhage, no focal mass, mass effect, hydrocephalus or abnormal intra or extra-axial fluid collections.  No acute displaced skull fractures are identified.  Visualized paranasal sinuses and mastoids are well pneumatized.   IMPRESSION: 1.  No acute intracranial abnormalities. 2.  Cerebral and cerebellar atrophy with chronic white matter changes, similar to prior examinations, as detailed above.  These results were called by telephone on 12/28/2011 at 03:40 p.m. to Dr. Rubin Payor, who verbally acknowledged these results.  Original Report Authenticated By: Florencia Reasons, M.D.     No diagnosis found.   MDM  Pt presents via EMS from a local nursing home.  She is reported to have experienced an acute change in her mental status and now has garbled speech.  EMS reported her initial blood sugar to be low and administered D50 without any improvement in her symptoms.  On-call Neurologist was at the bedside on her arrival to the ER and has performed an H&P also.  She is currently alert and oriented to person, place, time , and situation.  She has slightly garbled speech but the words are intelligible  and the content is appropriate.  Although it takes time and above normal effort, she is able to follow commands and her exam reveals no specific new focal deficit.  At this time will cancel the "code stroke", will initiated a broad screening to evaluate for other causes for an acute mental status change.  Although pt does not meet criteria for stroke/tPA administration, she has developed a fever.  Concern is for early sepsis.  Chest xray, blood and urine cultures obtained.  Administered vanc and zosyn.  Discussed with on-call hospitalist, plan admit.  At the hospitalist's request, LP performed secondary to fever and altered mental status while taking an immunosuppressive agent.  Procedure well tolerated.        Tobin Chad, MD 12/28/11 1801  Tobin Chad, MD 12/28/11 2111

## 2011-12-29 ENCOUNTER — Encounter (HOSPITAL_COMMUNITY): Payer: Self-pay | Admitting: General Practice

## 2011-12-29 ENCOUNTER — Ambulatory Visit (HOSPITAL_COMMUNITY): Payer: Medicare Other

## 2011-12-29 DIAGNOSIS — F329 Major depressive disorder, single episode, unspecified: Secondary | ICD-10-CM | POA: Diagnosis present

## 2011-12-29 DIAGNOSIS — R4182 Altered mental status, unspecified: Secondary | ICD-10-CM

## 2011-12-29 DIAGNOSIS — N10 Acute tubulo-interstitial nephritis: Principal | ICD-10-CM

## 2011-12-29 DIAGNOSIS — E119 Type 2 diabetes mellitus without complications: Secondary | ICD-10-CM

## 2011-12-29 DIAGNOSIS — G9341 Metabolic encephalopathy: Secondary | ICD-10-CM | POA: Diagnosis present

## 2011-12-29 DIAGNOSIS — E876 Hypokalemia: Secondary | ICD-10-CM | POA: Diagnosis present

## 2011-12-29 LAB — BASIC METABOLIC PANEL
CO2: 25 mEq/L (ref 19–32)
Chloride: 105 mEq/L (ref 96–112)
Glucose, Bld: 111 mg/dL — ABNORMAL HIGH (ref 70–99)
Potassium: 3.4 mEq/L — ABNORMAL LOW (ref 3.5–5.1)
Sodium: 142 mEq/L (ref 135–145)

## 2011-12-29 LAB — HERPES SIMPLEX VIRUS(HSV) DNA BY PCR
HSV 1 DNA: NOT DETECTED
HSV 2 DNA: NOT DETECTED

## 2011-12-29 LAB — GLUCOSE, CAPILLARY
Glucose-Capillary: 100 mg/dL — ABNORMAL HIGH (ref 70–99)
Glucose-Capillary: 111 mg/dL — ABNORMAL HIGH (ref 70–99)
Glucose-Capillary: 157 mg/dL — ABNORMAL HIGH (ref 70–99)

## 2011-12-29 LAB — CBC
Hemoglobin: 11.4 g/dL — ABNORMAL LOW (ref 12.0–15.0)
MCH: 25.4 pg — ABNORMAL LOW (ref 26.0–34.0)
MCV: 76.2 fL — ABNORMAL LOW (ref 78.0–100.0)
RBC: 4.49 MIL/uL (ref 3.87–5.11)
WBC: 7.8 10*3/uL (ref 4.0–10.5)

## 2011-12-29 LAB — DIFFERENTIAL
Eosinophils Absolute: 0.1 10*3/uL (ref 0.0–0.7)
Eosinophils Relative: 1 % (ref 0–5)
Lymphocytes Relative: 20 % (ref 12–46)
Lymphs Abs: 1.5 10*3/uL (ref 0.7–4.0)
Monocytes Relative: 9 % (ref 3–12)

## 2011-12-29 MED ORDER — POTASSIUM CHLORIDE CRYS ER 20 MEQ PO TBCR
40.0000 meq | EXTENDED_RELEASE_TABLET | Freq: Once | ORAL | Status: AC
  Administered 2011-12-29: 40 meq via ORAL
  Filled 2011-12-29: qty 2

## 2011-12-29 NOTE — Progress Notes (Signed)
Subjective: Patient with change in mental status.  Patient was septic on admission. She is on immunosuppressive for MS.  She was noticed to have change in speech and significant change in mental status.  Sepsis was  Considered and patient was started on ABX.  Cultures are pending Patient seen at the bedside, she is awake, alert and following commands.  She is afebrile and has improved significantly.   She denies headaches, no nausea, no vomiting, no dizziness, no chest pain,  No sob, no abdominal pain, no other complaints. Able to tolerate PO   Current vital signs: BP 122/83  Pulse 79  Temp 97.6 F (36.4 C) (Oral)  Resp 20  Ht 5\' 6"  (1.676 m)  Wt 72.1 kg (158 lb 15.2 oz)  BMI 25.66 kg/m2  SpO2 100% Vital signs in last 24 hours: Temp:  [97.6 F (36.4 C)-103.2 F (39.6 C)] 97.6 F (36.4 C) (08/14 1349) Pulse Rate:  [79-129] 79  (08/14 1349) Resp:  [18-29] 20  (08/14 1349) BP: (122-186)/(83-115) 122/83 mmHg (08/14 1349) SpO2:  [96 %-100 %] 100 % (08/14 1349) Weight:  [61.7 kg (136 lb 0.4 oz)-72.1 kg (158 lb 15.2 oz)] 72.1 kg (158 lb 15.2 oz) (08/13 2248)  Intake/Output from previous day: 08/13 0701 - 08/14 0700 In: -  Out: 2 [Urine:2] Intake/Output this shift: Total I/O In: 120 [P.O.:120] Out: -  Nutritional status: Dysphagia  Lungs: CTA Heart: RRR Abdomen: bowel sounds present Extremities; Trace edema  Neurologic Exam: AAo  Follows commands  Motor: able to lift UE  Antigravity Significant weakness in the LE but able to move them. Reflexes 1 Sensory: intact  Mild dystonia noticed on the left side compared to the right   Lab Results: Results for orders placed during the hospital encounter of 12/28/11 (from the past 48 hour(s))  PROTIME-INR     Status: Normal   Collection Time   12/28/11  3:25 PM      Component Value Range Comment   Prothrombin Time 13.4  11.6 - 15.2 seconds    INR 1.00  0.00 - 1.49   APTT     Status: Normal   Collection Time   12/28/11   3:25 PM      Component Value Range Comment   aPTT 32  24 - 37 seconds   CBC     Status: Abnormal   Collection Time   12/28/11  3:25 PM      Component Value Range Comment   WBC 4.7  4.0 - 10.5 K/uL    RBC 4.67  3.87 - 5.11 MIL/uL    Hemoglobin 11.7 (*) 12.0 - 15.0 g/dL    HCT 16.1  09.6 - 04.5 %    MCV 78.4  78.0 - 100.0 fL    MCH 25.1 (*) 26.0 - 34.0 pg    MCHC 32.0  30.0 - 36.0 g/dL    RDW 40.9  81.1 - 91.4 %    Platelets 180  150 - 400 K/uL   DIFFERENTIAL     Status: Normal   Collection Time   12/28/11  3:25 PM      Component Value Range Comment   Neutrophils Relative 66  43 - 77 %    Neutro Abs 3.1  1.7 - 7.7 K/uL    Lymphocytes Relative 20  12 - 46 %    Lymphs Abs 0.9  0.7 - 4.0 K/uL    Monocytes Relative 10  3 - 12 %    Monocytes Absolute 0.5  0.1 - 1.0 K/uL    Eosinophils Relative 4  0 - 5 %    Eosinophils Absolute 0.2  0.0 - 0.7 K/uL    Basophils Relative 1  0 - 1 %    Basophils Absolute 0.0  0.0 - 0.1 K/uL   COMPREHENSIVE METABOLIC PANEL     Status: Abnormal   Collection Time   12/28/11  3:25 PM      Component Value Range Comment   Sodium 140  135 - 145 mEq/L    Potassium 3.9  3.5 - 5.1 mEq/L    Chloride 104  96 - 112 mEq/L    CO2 25  19 - 32 mEq/L    Glucose, Bld 157 (*) 70 - 99 mg/dL    BUN 11  6 - 23 mg/dL    Creatinine, Ser 1.61  0.50 - 1.10 mg/dL    Calcium 9.5  8.4 - 09.6 mg/dL    Total Protein 7.7  6.0 - 8.3 g/dL    Albumin 3.6  3.5 - 5.2 g/dL    AST 18  0 - 37 U/L    ALT 10  0 - 35 U/L    Alkaline Phosphatase 52  39 - 117 U/L    Total Bilirubin 0.2 (*) 0.3 - 1.2 mg/dL    GFR calc non Af Amer >90  >90 mL/min    GFR calc Af Amer >90  >90 mL/min   CK TOTAL AND CKMB     Status: Normal   Collection Time   12/28/11  3:26 PM      Component Value Range Comment   Total CK 66  7 - 177 U/L    CK, MB 2.0  0.3 - 4.0 ng/mL    Relative Index RELATIVE INDEX IS INVALID  0.0 - 2.5   TROPONIN I     Status: Normal   Collection Time   12/28/11  3:26 PM      Component  Value Range Comment   Troponin I <0.30  <0.30 ng/mL   POCT I-STAT, CHEM 8     Status: Abnormal   Collection Time   12/28/11  3:35 PM      Component Value Range Comment   Sodium 142  135 - 145 mEq/L    Potassium 3.8  3.5 - 5.1 mEq/L    Chloride 105  96 - 112 mEq/L    BUN 11  6 - 23 mg/dL    Creatinine, Ser 0.45  0.50 - 1.10 mg/dL    Glucose, Bld 409 (*) 70 - 99 mg/dL    Calcium, Ion 8.11  9.14 - 1.23 mmol/L    TCO2 24  0 - 100 mmol/L    Hemoglobin 12.6  12.0 - 15.0 g/dL    HCT 78.2  95.6 - 21.3 %   GLUCOSE, CAPILLARY     Status: Normal   Collection Time   12/28/11  4:07 PM      Component Value Range Comment   Glucose-Capillary 78  70 - 99 mg/dL   URINALYSIS, ROUTINE W REFLEX MICROSCOPIC     Status: Abnormal   Collection Time   12/28/11  4:34 PM      Component Value Range Comment   Color, Urine YELLOW  YELLOW    APPearance HAZY (*) CLEAR    Specific Gravity, Urine 1.011  1.005 - 1.030    pH 7.5  5.0 - 8.0    Glucose, UA NEGATIVE  NEGATIVE mg/dL    Hgb urine dipstick  NEGATIVE  NEGATIVE    Bilirubin Urine NEGATIVE  NEGATIVE    Ketones, ur NEGATIVE  NEGATIVE mg/dL    Protein, ur NEGATIVE  NEGATIVE mg/dL    Urobilinogen, UA 1.0  0.0 - 1.0 mg/dL    Nitrite NEGATIVE  NEGATIVE    Leukocytes, UA NEGATIVE  NEGATIVE MICROSCOPIC NOT DONE ON URINES WITH NEGATIVE PROTEIN, BLOOD, LEUKOCYTES, NITRITE, OR GLUCOSE <1000 mg/dL.  URINE CULTURE     Status: Normal (Preliminary result)   Collection Time   12/28/11  4:34 PM      Component Value Range Comment   Specimen Description URINE, CLEAN CATCH      Special Requests unknown      Culture  Setup Time 12/28/2011 17:14      Colony Count >=100,000 COLONIES/ML      Culture GRAM NEGATIVE RODS      Report Status PENDING     CULTURE, BLOOD (ROUTINE X 2)     Status: Normal (Preliminary result)   Collection Time   12/28/11  5:00 PM      Component Value Range Comment   Specimen Description BLOOD LEFT ARM      Special Requests BOTTLES DRAWN AEROBIC AND  ANAEROBIC 10CC      Culture  Setup Time 12/28/2011 22:53      Culture        Value:        BLOOD CULTURE RECEIVED NO GROWTH TO DATE CULTURE WILL BE HELD FOR 5 DAYS BEFORE ISSUING A FINAL NEGATIVE REPORT   Report Status PENDING     CULTURE, BLOOD (ROUTINE X 2)     Status: Normal (Preliminary result)   Collection Time   12/28/11  5:20 PM      Component Value Range Comment   Specimen Description BLOOD LEFT HAND      Special Requests BOTTLES DRAWN AEROBIC AND ANAEROBIC 10CC      Culture  Setup Time 12/28/2011 22:53      Culture        Value:        BLOOD CULTURE RECEIVED NO GROWTH TO DATE CULTURE WILL BE HELD FOR 5 DAYS BEFORE ISSUING A FINAL NEGATIVE REPORT   Report Status PENDING     GLUCOSE, CAPILLARY     Status: Abnormal   Collection Time   12/28/11  7:48 PM      Component Value Range Comment   Glucose-Capillary 125 (*) 70 - 99 mg/dL    Comment 1 Documented in Chart      Comment 2 Notify RN     PROTEIN AND GLUCOSE, CSF     Status: Normal   Collection Time   12/28/11  9:06 PM      Component Value Range Comment   Glucose, CSF 73  43 - 76 mg/dL    Total  Protein, CSF 31  15 - 45 mg/dL   CSF CULTURE     Status: Normal (Preliminary result)   Collection Time   12/28/11  9:10 PM      Component Value Range Comment   Specimen Description CSF      Special Requests NONE      Gram Stain        Value: CYTOSPIN WBC PRESENT, PREDOMINANTLY MONONUCLEAR     NO ORGANISMS SEEN     Performed at [facility]   Culture NO GROWTH      Report Status PENDING     GRAM STAIN     Status: Normal   Collection  Time   12/28/11  9:11 PM      Component Value Range Comment   Specimen Description CSF      Special Requests NONE      Gram Stain        Value: WBC PRESENT, PREDOMINANTLY MONONUCLEAR     NO ORGANISMS SEEN     CYTOSPIN SLIDE   Report Status 12/28/2011 FINAL     CSF CELL COUNT WITH DIFFERENTIAL     Status: Abnormal   Collection Time   12/28/11  9:12 PM      Component Value Range Comment    Tube # 3      Color, CSF COLORLESS  COLORLESS    Appearance, CSF CLEAR  CLEAR    Supernatant NOT INDICATED      RBC Count, CSF 2 (*) 0 /cu mm    WBC, CSF 1  0 - 5 /cu mm    Lymphs, CSF FEW  40 - 80 %    Monocyte-Macrophage-Spinal Fluid RARE  15 - 45 %    Other Cells, CSF TOO FEW TO COUNT, SMEAR AVAILABLE FOR REVIEW     BASIC METABOLIC PANEL     Status: Abnormal   Collection Time   12/29/11  5:40 AM      Component Value Range Comment   Sodium 142  135 - 145 mEq/L    Potassium 3.4 (*) 3.5 - 5.1 mEq/L    Chloride 105  96 - 112 mEq/L    CO2 25  19 - 32 mEq/L    Glucose, Bld 111 (*) 70 - 99 mg/dL    BUN 10  6 - 23 mg/dL    Creatinine, Ser 9.56  0.50 - 1.10 mg/dL    Calcium 9.0  8.4 - 21.3 mg/dL    GFR calc non Af Amer >90  >90 mL/min    GFR calc Af Amer >90  >90 mL/min   CBC     Status: Abnormal   Collection Time   12/29/11  5:40 AM      Component Value Range Comment   WBC 7.8  4.0 - 10.5 K/uL    RBC 4.49  3.87 - 5.11 MIL/uL    Hemoglobin 11.4 (*) 12.0 - 15.0 g/dL    HCT 08.6 (*) 57.8 - 46.0 %    MCV 76.2 (*) 78.0 - 100.0 fL    MCH 25.4 (*) 26.0 - 34.0 pg    MCHC 33.3  30.0 - 36.0 g/dL    RDW 46.9  62.9 - 52.8 %    Platelets 191  150 - 400 K/uL   DIFFERENTIAL     Status: Normal   Collection Time   12/29/11  5:40 AM      Component Value Range Comment   Neutrophils Relative 70  43 - 77 %    Neutro Abs 5.5  1.7 - 7.7 K/uL    Lymphocytes Relative 20  12 - 46 %    Lymphs Abs 1.5  0.7 - 4.0 K/uL    Monocytes Relative 9  3 - 12 %    Monocytes Absolute 0.7  0.1 - 1.0 K/uL    Eosinophils Relative 1  0 - 5 %    Eosinophils Absolute 0.1  0.0 - 0.7 K/uL    Basophils Relative 0  0 - 1 %    Basophils Absolute 0.0  0.0 - 0.1 K/uL   HEMOGLOBIN A1C     Status: Abnormal   Collection Time   12/29/11  5:40 AM      Component Value Range Comment   Hemoglobin A1C 6.1 (*) <5.7 %    Mean Plasma Glucose 128 (*) <117 mg/dL   TSH     Status: Normal   Collection Time   12/29/11  5:40 AM       Component Value Range Comment   TSH 3.651  0.350 - 4.500 uIU/mL   GLUCOSE, CAPILLARY     Status: Abnormal   Collection Time   12/29/11  9:06 AM      Component Value Range Comment   Glucose-Capillary 160 (*) 70 - 99 mg/dL   MRSA PCR SCREENING     Status: Normal   Collection Time   12/29/11 11:18 AM      Component Value Range Comment   MRSA by PCR NEGATIVE  NEGATIVE   GLUCOSE, CAPILLARY     Status: Abnormal   Collection Time   12/29/11 12:05 PM      Component Value Range Comment   Glucose-Capillary 100 (*) 70 - 99 mg/dL     Recent Results (from the past 240 hour(s))  URINE CULTURE     Status: Normal (Preliminary result)   Collection Time   12/28/11  4:34 PM      Component Value Range Status Comment   Specimen Description URINE, CLEAN CATCH   Final    Special Requests unknown   Final    Culture  Setup Time 12/28/2011 17:14   Final    Colony Count >=100,000 COLONIES/ML   Final    Culture GRAM NEGATIVE RODS   Final    Report Status PENDING   Incomplete   CULTURE, BLOOD (ROUTINE X 2)     Status: Normal (Preliminary result)   Collection Time   12/28/11  5:00 PM      Component Value Range Status Comment   Specimen Description BLOOD LEFT ARM   Final    Special Requests BOTTLES DRAWN AEROBIC AND ANAEROBIC 10CC   Final    Culture  Setup Time 12/28/2011 22:53   Final    Culture     Final    Value:        BLOOD CULTURE RECEIVED NO GROWTH TO DATE CULTURE WILL BE HELD FOR 5 DAYS BEFORE ISSUING A FINAL NEGATIVE REPORT   Report Status PENDING   Incomplete   CULTURE, BLOOD (ROUTINE X 2)     Status: Normal (Preliminary result)   Collection Time   12/28/11  5:20 PM      Component Value Range Status Comment   Specimen Description BLOOD LEFT HAND   Final    Special Requests BOTTLES DRAWN AEROBIC AND ANAEROBIC 10CC   Final    Culture  Setup Time 12/28/2011 22:53   Final    Culture     Final    Value:        BLOOD CULTURE RECEIVED NO GROWTH TO DATE CULTURE WILL BE HELD FOR 5 DAYS BEFORE ISSUING A  FINAL NEGATIVE REPORT   Report Status PENDING   Incomplete   CSF CULTURE     Status: Normal (Preliminary result)   Collection Time   12/28/11  9:10 PM      Component Value Range Status Comment   Specimen Description CSF   Final    Special Requests NONE   Final    Gram Stain     Final    Value: CYTOSPIN WBC PRESENT, PREDOMINANTLY MONONUCLEAR     NO ORGANISMS SEEN     Performed  at [facility]   Culture NO GROWTH   Final    Report Status PENDING   Incomplete   GRAM STAIN     Status: Normal   Collection Time   12/28/11  9:11 PM      Component Value Range Status Comment   Specimen Description CSF   Final    Special Requests NONE   Final    Gram Stain     Final    Value: WBC PRESENT, PREDOMINANTLY MONONUCLEAR     NO ORGANISMS SEEN     CYTOSPIN SLIDE   Report Status 12/28/2011 FINAL   Final   MRSA PCR SCREENING     Status: Normal   Collection Time   12/29/11 11:18 AM      Component Value Range Status Comment   MRSA by PCR NEGATIVE  NEGATIVE Final     Lipid Panel No results found for this basename: CHOL,TRIG,HDL,CHOLHDL,VLDL,LDLCALC in the last 72 hours  Studies/Results: Dg Chest 2 View  12/28/2011  *RADIOLOGY REPORT*  Clinical Data: Altered mental status  CHEST - 2 VIEW  Comparison: 10/20/2011  Findings: Stable elevation of left diaphragmatic leaflet.  Low lung volumes.  Perihilar and bibasilar interstitial prominence without confluent airspace infiltrate or overt edema.  No definite effusion.  Heart size within normal limits.  Regional bones unremarkable.  IMPRESSION:  1.  Low volumes without acute or superimposed abnormality.  Original Report Authenticated By: Osa Craver, M.D.   Ct Head Wo Contrast  12/28/2011  *RADIOLOGY REPORT*  Clinical Data: Code stroke.  History of multiple sclerosis.  Left- sided weakness.  CT HEAD WITHOUT CONTRAST  Technique:  Contiguous axial images were obtained from the base of the skull through the vertex without contrast.  Comparison:  Head CT 10/20/2011.  Findings: Cerebral and cerebellar atrophy is age advanced. Extensive patchy and confluent areas of decreased attenuation throughout the deep and periventricular white matter of the cerebral hemispheres bilaterally, which likely reflect multifocal white matter lesions related to the patient's reported history of multiple sclerosis; alternatively, some of these findings may be related to chronic microvascular ischemic disease of the white matter.  No definite acute intracranial finding.  Specifically, no definite evidence of acute/subacute cerebral ischemia, no acute intracranial hemorrhage, no focal mass, mass effect, hydrocephalus or abnormal intra or extra-axial fluid collections.  No acute displaced skull fractures are identified.  Visualized paranasal sinuses and mastoids are well pneumatized.  IMPRESSION: 1.  No acute intracranial abnormalities. 2.  Cerebral and cerebellar atrophy with chronic white matter changes, similar to prior examinations, as detailed above.  These results were called by telephone on 12/28/2011 at 03:40 p.m. to Dr. Rubin Payor, who verbally acknowledged these results.  Original Report Authenticated By: Florencia Reasons, M.D.    Medications:   Assessment/Plan:     Patient on immunosuppressive drug of MS  Was brought here from Nursing home for change in mental status. She was febrile and sepsis was treated with ABX. She has improved significantly.  This is less likely to be a seizure. And I would not treat her with Steroids because she does not have exacerbation of MS. In fact she is at high risk for steroid induced psychosis.   Recommendations: 1) No EEG 2) No AED 3) If her symptoms get worse please call us  Will sign off  Shenique Childers V-P Eilleen Kempf., MD., Ph.D.,MS 12/29/2011 3:23 PM

## 2011-12-29 NOTE — Progress Notes (Signed)
Clinical Social Worker attempted to meet with pt as per MD report during progression, pt admitted from [facility]. Pt sleeping at this time. Clinical Social Worker to follow up to complete full psychosocial assessment. Clinical Social Worker to continue to follow.  Jacklynn Lewis, MSW, LCSWA  Clinical Social Work [phone]

## 2011-12-29 NOTE — Clinical Social Work Psychosocial (Signed)
     Clinical Social Work Department BRIEF PSYCHOSOCIAL ASSESSMENT 12/29/2011  Patient:  [name], [name]     Account Number:  [id number]     Admit date:  12/28/2011  Clinical Social Worker:  Jacelyn Grip  Date/Time:  12/29/2011 02:00 PM  Referred by:  Physician  Date Referred:  12/29/2011 Referred for  ALF Placement   Other Referral:   Interview type:  Patient Other interview type:    PSYCHOSOCIAL DATA Living Status:  FACILITY Admitted from facility:  [facility] Level of care:   Primary support name:  Huntley Dec Chavis/[phone] Primary support relationship to patient:  [place] Degree of support available:   unknown at this time    CURRENT CONCERNS Current Concerns  Post-Acute Placement   Other Concerns:    SOCIAL WORK ASSESSMENT / PLAN CSW received referral that pt admitted from [facility]F. CSW met with pt at bedside to discuss. Pt confirmed that she is a resident at [facility]. Pt is agreeable to returning to [facility] when medically stable for discharge. CSW contacted facility requested this CSW fax pt FL2 and facility will need to review FL2 before confirming that pt can return. [facility] stated that pt baseline is wheelchair bound and pt receives assistance with dressing and bathing. CSW to complete FL2 and fax to [facility]. CSW to continue to follow and facilitate pt discharge needs when pt medically stable for discharge.   Assessment/plan status:  Psychosocial Support/Ongoing Assessment of Needs Other assessment/ plan:   discharge planning   Information/referral to community resources:   Referral back to [facility]    PATIENTS/FAMILYS RESPONSE TO PLAN OF CARE: Pt alert and oriented x4. Pt reports that she has been a resident at [facility] for two years. Pt discussed that she is satisfied with facility and agreeable to returning.

## 2011-12-29 NOTE — Progress Notes (Signed)
[facility] PROGRESS NOTE  [name] WUJ:[id number] DOB: 1955/10/17 DOA: 12/28/2011 PCP: Florentina Jenny, MD  Assessment/Plan: Principal Problem:  *Encephalopathy, metabolic Active Problems:  MS (multiple sclerosis)  Fever  DM2 (diabetes mellitus, type 2)  Depression  Pyelonephritis, acute  Hypokalemia  1. Toxic metabolic encephalopathy due to infectious process-resolved 2. Acute pyelonephritis: - Probably the source of the fever. Patient has been improving gradually. Urine culture on August 14 gram-negative rods. Initially patient was started on broad-spectrum antibiotics with vancomycin and Zosyn. We discontinued vancomycin on the 14th. Continue Zosyn until final results of cultures are available. 3. Multiple sclerosis - plan to continue Copaxone without changes 4. Mild hypokalemia-replete 5. Diabetes - for now we'll treat with sliding scale insulin alone  Code Status: Full Family Communication: Sister Disposition Plan: Back to assisted living   Brief narrative: 56 year old woman with multiple sclerosis admitted with confusion from assisted-living. Found to have pyelonephritis  Consultants:  Neurology  Procedures:  None  Antibiotics:  Vancomycin August 13 to August 14  Zosyn August 13  HPI/Subjective: Feels great today  Objective: Filed Vitals:   12/29/11 0502 12/29/11 0700 12/29/11 0857 12/29/11 1349  BP: 134/96  127/87 122/83  Pulse: 85 84 88 79  Temp: 98.5 F (36.9 C)   97.6 F (36.4 C)  TempSrc: Oral   Oral  Resp: 20   20  Height:      Weight:      SpO2: 98%   100%    Intake/Output Summary (Last 24 hours) at 12/29/11 1511 Last data filed at 12/29/11 1300  Gross per 24 hour  Intake    120 ml  Output      2 ml  Net    118 ml   Filed Weights   12/28/11 1700 12/28/11 2248  Weight: 61.7 kg (136 lb 0.4 oz) 72.1 kg (158 lb 15.2 oz)    Exam:   General:  Alert and oriented x3 extremely loquacious  Cardiovascular: Regular rate and  rhythm without murmurs rubs or gallops  Respiratory: Clear to auscultation bilaterally  Abdomen: Soft nontender  Patient has right CVA tenderness  Data Reviewed: Basic Metabolic Panel:  Lab 12/29/11 9562 12/28/11 1535 12/28/11 1525  NA 142 142 140  K 3.4* 3.8 3.9  CL 105 105 104  CO2 25 -- 25  GLUCOSE 111* 149* 157*  BUN 10 11 11   CREATININE 0.68 0.70 0.63  CALCIUM 9.0 -- 9.5  MG -- -- --  PHOS -- -- --   Liver Function Tests:  Lab 12/28/11 1525  AST 18  ALT 10  ALKPHOS 52  BILITOT 0.2*  PROT 7.7  ALBUMIN 3.6   No results found for this basename: LIPASE:5,AMYLASE:5 in the last 168 hours No results found for this basename: AMMONIA:5 in the last 168 hours CBC:  Lab 12/29/11 0540 12/28/11 1535 12/28/11 1525  WBC 7.8 -- 4.7  NEUTROABS 5.5 -- 3.1  HGB 11.4* 12.6 11.7*  HCT 34.2* 37.0 36.6  MCV 76.2* -- 78.4  PLT 191 -- 180   Cardiac Enzymes:  Lab 12/28/11 1526  CKTOTAL 66  CKMB 2.0  CKMBINDEX --  TROPONINI <0.30   BNP (last 3 results) No results found for this basename: PROBNP:3 in the last 8760 hours CBG:  Lab 12/29/11 1205 12/29/11 0906 12/28/11 1948 12/28/11 1607  GLUCAP 100* 160* 125* 78  Results for Claudia, James (MRN [id number]) as of 12/29/2011 07:21  Ref. Range 12/28/2011 21:06 12/28/2011 21:10 12/28/2011 21:11 12/28/2011 21:12 12/29/2011 05:40  Glucose, CSF Latest Range: 43-76 mg/dL 73      Total  Protein, CSF Latest Range: 15-45 mg/dL 31      RBC Count, CSF Latest Range: 0 /cu mm    2 (H)   WBC, CSF Latest Range: 0-5 /cu mm    1   Lymphs, CSF Latest Range: 40-80 %    FEW   Monocyte-Macrophage-Spinal Fluid Latest Range: 15-45 %    RARE   Other Cells, CSF No range found    TOO FEW TO COUNT, SMEAR AVAILABLE FOR REVIEW   Appearance, CSF Latest Range: CLEAR     CLEAR   Color, CSF Latest Range: COLORLESS     COLORLESS   Supernatant No range found    NOT INDICATED   Tube # No range found    3     Recent Results (from the past 240 hour(s))  URINE  CULTURE     Status: Normal (Preliminary result)   Collection Time   12/28/11  4:34 PM      Component Value Range Status Comment   Specimen Description URINE, CLEAN CATCH   Final    Special Requests unknown   Final    Culture  Setup Time 12/28/2011 17:14   Final    Colony Count >=100,000 COLONIES/ML   Final    Culture GRAM NEGATIVE RODS   Final    Report Status PENDING   Incomplete   CULTURE, BLOOD (ROUTINE X 2)     Status: Normal (Preliminary result)   Collection Time   12/28/11  5:00 PM      Component Value Range Status Comment   Specimen Description BLOOD LEFT ARM   Final    Special Requests BOTTLES DRAWN AEROBIC AND ANAEROBIC 10CC   Final    Culture  Setup Time 12/28/2011 22:53   Final    Culture     Final    Value:        BLOOD CULTURE RECEIVED NO GROWTH TO DATE CULTURE WILL BE HELD FOR 5 DAYS BEFORE ISSUING A FINAL NEGATIVE REPORT   Report Status PENDING   Incomplete   CULTURE, BLOOD (ROUTINE X 2)     Status: Normal (Preliminary result)   Collection Time   12/28/11  5:20 PM      Component Value Range Status Comment   Specimen Description BLOOD LEFT HAND   Final    Special Requests BOTTLES DRAWN AEROBIC AND ANAEROBIC 10CC   Final    Culture  Setup Time 12/28/2011 22:53   Final    Culture     Final    Value:        BLOOD CULTURE RECEIVED NO GROWTH TO DATE CULTURE WILL BE HELD FOR 5 DAYS BEFORE ISSUING A FINAL NEGATIVE REPORT   Report Status PENDING   Incomplete   CSF CULTURE     Status: Normal (Preliminary result)   Collection Time   12/28/11  9:10 PM      Component Value Range Status Comment   Specimen Description CSF   Final    Special Requests NONE   Final    Gram Stain     Final    Value: CYTOSPIN WBC PRESENT, PREDOMINANTLY MONONUCLEAR     NO ORGANISMS SEEN     Performed at [facility]   Culture NO GROWTH   Final    Report Status PENDING   Incomplete   GRAM STAIN     Status: Normal   Collection Time   12/28/11  9:11 PM      Component Value Range Status Comment     Specimen Description CSF   Final    Special Requests NONE   Final    Gram Stain     Final    Value: WBC PRESENT, PREDOMINANTLY MONONUCLEAR     NO ORGANISMS SEEN     CYTOSPIN SLIDE   Report Status 12/28/2011 FINAL   Final   MRSA PCR SCREENING     Status: Normal   Collection Time   12/29/11 11:18 AM      Component Value Range Status Comment   MRSA by PCR NEGATIVE  NEGATIVE Final      Studies: Dg Chest 2 View  12/28/2011  *RADIOLOGY REPORT*  Clinical Data: Altered mental status  CHEST - 2 VIEW  Comparison: 10/20/2011  Findings: Stable elevation of left diaphragmatic leaflet.  Low lung volumes.  Perihilar and bibasilar interstitial prominence without confluent airspace infiltrate or overt edema.  No definite effusion.  Heart size within normal limits.  Regional bones unremarkable.  IMPRESSION:  1.  Low volumes without acute or superimposed abnormality.  Original Report Authenticated By: Osa Craver, M.D.   Ct Head Wo Contrast  12/28/2011  *RADIOLOGY REPORT*  Clinical Data: Code stroke.  History of multiple sclerosis.  Left- sided weakness.  CT HEAD WITHOUT CONTRAST  Technique:  Contiguous axial images were obtained from the base of the skull through the vertex without contrast.  Comparison: Head CT 10/20/2011.  Findings: Cerebral and cerebellar atrophy is age advanced. Extensive patchy and confluent areas of decreased attenuation throughout the deep and periventricular white matter of the cerebral hemispheres bilaterally, which likely reflect multifocal white matter lesions related to the patient's reported history of multiple sclerosis; alternatively, some of these findings may be related to chronic microvascular ischemic disease of the white matter.  No definite acute intracranial finding.  Specifically, no definite evidence of acute/subacute cerebral ischemia, no acute intracranial hemorrhage, no focal mass, mass effect, hydrocephalus or abnormal intra or extra-axial fluid collections.   No acute displaced skull fractures are identified.  Visualized paranasal sinuses and mastoids are well pneumatized.  IMPRESSION: 1.  No acute intracranial abnormalities. 2.  Cerebral and cerebellar atrophy with chronic white matter changes, similar to prior examinations, as detailed above.  These results were called by telephone on 12/28/2011 at 03:40 p.m. to Dr. Rubin Payor, who verbally acknowledged these results.  Original Report Authenticated By: Florencia Reasons, M.D.    Scheduled Meds:    . sodium chloride   Intravenous Once  . acetaminophen  650 mg Rectal Once  . aspirin  81 mg Oral Daily  . cloNIDine  0.1 mg Oral Daily  . dextrose      . docusate sodium  100 mg Oral BID  . heparin  5,000 Units Subcutaneous Q8H  . insulin aspart  0-9 Units Subcutaneous TID WC  . lidocaine-EPINEPHrine  20 mL Intradermal Once  . lisinopril  20 mg Oral Daily  . metoprolol  2.5 mg Intravenous Once  . oxybutynin  15 mg Oral Daily  . piperacillin-tazobactam (ZOSYN)  IV  3.375 g Intravenous Once  . piperacillin-tazobactam (ZOSYN)  IV  3.375 g Intravenous Q8H  . potassium chloride  40 mEq Oral Once  . senna-docusate  2 tablet Oral QHS  . simvastatin  5 mg Oral q1800  . vancomycin  1,000 mg Intravenous Once  . DISCONTD: cefTRIAXone (ROCEPHIN)  IV  2 g Intravenous Q12H  . DISCONTD: vancomycin  750 mg  Intravenous Q12H   Continuous Infusions:    . DISCONTD: sodium chloride        Time spent: 30 minutes    Enma Maeda  [facility] Pager [phone]. If 7PM-7AM, please contact night-coverage at [email or web address], password [facility] 12/29/2011, 3:11 PM  LOS: 1 day

## 2011-12-29 NOTE — Clinical Documentation Improvement (Signed)
SEPSIS DOCUMENTATION QUERY  THIS DOCUMENT IS NOT A PERMANENT PART OF THE MEDICAL RECORD  TO RESPOND TO THE THIS QUERY, FOLLOW THE INSTRUCTIONS BELOW:  1. If needed, update documentation for the patient's encounter via the notes activity.  2. Access this query again and click edit on the In [employer].  3. After updating, or not, click F2 to complete all highlighted (required) fields concerning your review. Select "additional documentation in the medical record" OR "no additional documentation provided".  4. Click Sign note button.  5. The deficiency will fall out of your In Basket *Please let us know if you are not able to complete this workflow by phone or e-mail (listed below).  Please update your documentation within the medical record to reflect your response to this query.                                                                                    12/29/11  Dear Dr. Ellin Saba Marton Redwood,  In a better effort to capture your patient's severity of illness, reflect appropriate length of stay and utilization of resources, a review of the patient medical record has revealed the following indicators.    Based on your clinical judgment, please clarify and document in a progress note and/or discharge summary the clinical condition associated with the following supporting information:  In responding to this query please exercise your independent judgment.  The fact that a query is asked, does not imply that any particular answer is desired or expected.   Noted at time of admit vs  186/115/ 106/T 103.2 F (39.6 /Rectal)/ R26, SpO2 100.00%, (1634pm) T 101.1 by ( 2248 pm)  after Vancomycin, Rocephin and Zosyn via IV ,  Pulse 117/ R 24... VS wnl after 12 hours of medical treatment. Per ER MD note "concern for early sepsis".  If secondary diagnosis is appropriate please document in notes/ d /c summary. Thank you   Possible Clinical Conditions?  Early Sepsis (POA)   SIRS (POA)  Other  Condition   Cannot clinically Determine     Risk Factors: fever, MS immunocompromised  Presenting Signs and Symptoms: T 103.2, P 106, R26  Diagnostics: Lumbar Puncture, Blood culture, urine cx  Cultures: , Blood culture, urine cx    Treatment: IV Vancomycin, Rocephin, Zosyn  Reviewed:  no additional documentation provided  Thank You,  Leonette Most Addison  Clinical Documentation Specialist RN, BSN:  Pager [phone]  HIM off # [phone]  Health Information Management [place]

## 2011-12-29 NOTE — Evaluation (Signed)
Clinical/Bedside Swallow Evaluation Patient Details  Name: GLENDOLA FRIEDHOFF MRN: [id number] Date of Birth: 30-Dec-1955  Today's Date: 12/29/2011 Time: 0800-0830 SLP Time Calculation (min): 30 min  Past Medical History:  Past Medical History  Diagnosis Date  . Allergic rhinitis   . Diabetes mellitus   . Hypertension   . Hypokalemia   . Depression   . Multiple sclerosis   . Vitamin d deficiency   . Dysmetabolic syndrome X   . Urinary incontinence   . Neuropathic pain    Past Surgical History:  Past Surgical History  Procedure Date  . Cesarean section    HPI:  56 yo female adm to [facility] with fever and AMS.  Pt has h/o asthma, HTN.  Pt states she was diagnosed with MS in 1976.  CXR was negative.  Pt denies dysphagia nor symptoms of reflux.    Assessment / Plan / Recommendation Clinical Impression  Pt appears with functional oropharyngeal swallow observed with cracker, applesauce, thin water, and orange juice.  Pt does not upper dentition (nor dentures)  which prevent her from being able to maticate hard solids.  Pt's swallow is timely with clear voice throughout and pt self fed.  No focal cranial nerve deficits and pt denies dysphagia.  Rec pt initiate soft/ground meat/thin diet with precuaitons.     Aspiration Risk  Mild    Diet Recommendation Dysphagia 3 (Mechanical Soft) (GROUND MEATS)   Liquid Administration via: Straw;Cup Medication Administration: Whole meds with liquid Supervision: Patient able to self feed Compensations: Slow rate;Small sips/bites Postural Changes and/or Swallow Maneuvers: Seated upright 90 degrees;Out of bed for meals    Other  Recommendations Oral Care Recommendations: Oral care BID   Follow Up Recommendations  None    Frequency and Duration        Pertinent Vitals/Pain Afebrile, decreased breath sounds    SLP Swallow Goals   n/a  Swallow Study Prior Functional Status       General HPI: 56 yo female adm to [facility] with fever and AMS.  Pt  has h/o asthma, HTN.  Pt states she was diagnosed with MS in 1976.  CXR was negative.  Pt denies dysphagia nor symptoms of reflux.  Type of Study: Bedside swallow evaluation Previous Swallow Assessment: none Diet Prior to this Study: NPO Temperature Spikes Noted: No Respiratory Status: Room air Behavior/Cognition: Alert;Cooperative Oral Cavity - Dentition: Adequate natural dentition;Missing dentition (upper missing) Self-Feeding Abilities: Able to feed self Patient Positioning: Upright in bed Baseline Vocal Quality: Clear Volitional Cough: Strong Volitional Swallow: Unable to elicit (due to xerostomia)    Oral/Motor/Sensory Function Overall Oral Motor/Sensory Function: Appears within functional limits for tasks assessed   Ice Chips Ice chips: Not tested   Thin Liquid Thin Liquid: Within functional limits Presentation: Cup;Self Fed;Straw    Nectar Thick Nectar Thick Liquid: Not tested   Honey Thick Honey Thick Liquid: Not tested   Puree Puree: Within functional limits Presentation: Self Fed;Spoon   Solid   GO    Solid: Impaired Presentation: Self Fed Oral Phase Functional Implications: Left anterior spillage Other Comments: decreased mastication due to edentulous upper and pt has no dentures (upper)        Donavan Burnet, MS [facility] SLP [phone]

## 2011-12-29 NOTE — Care Management Note (Signed)
    Page 1 of 1   12/31/2011     11:48:23 AM   CARE MANAGEMENT NOTE 12/31/2011  Patient:  [name], [name]   Account Number:  [id number]  Date Initiated:  12/29/2011  Documentation initiated by:  Letha Cape  Subjective/Objective Assessment:   dx encephalopathy metabolic  admit- lives if from ALF-has roommate, patient is w/chair bound.     Action/Plan:   Anticipated DC Date:  12/30/2011   Anticipated DC Plan:  ASSISTED LIVING / REST HOME      DC Planning Services  CM consult      Choice offered to / List presented to:             Status of service:  Completed, signed off Medicare Important Message given?   (If response is "NO", the following Medicare IM given date fields will be blank) Date Medicare IM given:   Date Additional Medicare IM given:    Discharge Disposition:  ASSISTED LIVING  Per UR Regulation:  Reviewed for med. necessity/level of care/duration of stay  If discussed at Long Length of Stay Meetings, dates discussed:    Comments:  12/31/11 11:47 Letha Cape RN, BSN [phone] patient dc to ALF.  12/29/11 15;19 Letha Cape RN, BSN [phone] Patient lives at ALF with room mate, patient has medication coverage and transportation.  PTA patient is w/chair bound. patient has ast with adls at ALF.   I was informed by MD that patient wanted [place] to contact her ex husband to let him know that she is here,she does not know how to contact him. I spoke with patient, she states her sister knows patient and knows where he lives, I informed her to have her sisters contact him and let him know that she is here.  Her ex husband is not listed as a contact for her.  Patient states she will contact her sister to get her to contact him.

## 2011-12-30 DIAGNOSIS — E876 Hypokalemia: Secondary | ICD-10-CM

## 2011-12-30 DIAGNOSIS — G9341 Metabolic encephalopathy: Secondary | ICD-10-CM

## 2011-12-30 DIAGNOSIS — G35 Multiple sclerosis: Secondary | ICD-10-CM

## 2011-12-30 LAB — URINE CULTURE

## 2011-12-30 MED ORDER — CEFUROXIME AXETIL 500 MG PO TABS: 500.0000 mg | ORAL_TABLET | Freq: Two times a day (BID) | ORAL | Status: AC

## 2011-12-30 NOTE — Discharge Summary (Signed)
Physician Discharge Summary  [name] ZOX:[id number] DOB: November 26, 1955 DOA: 12/28/2011  PCP: Florentina Jenny, MD  Admit date: 12/28/2011 Discharge date: 12/30/2011  Discharge Diagnoses:  Escherichia coli Pyelonephritis, acute Toxic Encephalopathy, metabolic - resolved Active Problems:  MS (multiple sclerosis)  Fever  DM2 (diabetes mellitus, type 2)  Depression  Hypokalemia - resolved   Discharge Condition: good, back to baseline  Diet recommendation: Heart healthy  Filed Weights   12/28/11 1700 12/28/11 2248  Weight: 61.7 kg (136 lb 0.4 oz) 72.1 kg (158 lb 15.2 oz)    History of present illness:  56 yo brought from assisted living with altered mental status  Hospital Course:  1. Patient was brought to the emergency room with acute onset of altered mental status . Initially a code stroke was called but then after a head CT was obtained and patient was seen by neurology he became apparent that she actually was having a toxic metabolic encephalopathy due to infectious process. The encephalopathy resolved 24 hours after admission with IV fluids and IV antibiotics 2. E. coli Acute pyelonephritis: - Initially patient was started on broad-spectrum antibiotics with vancomycin and Zosyn. Patient has been improving gradually. Urine culture on August 14 grew gram-negative rods. Initially patient was started on broad-spectrum antibiotics with vancomycin and Zosyn. We discontinued vancomycin on the 14th. We continued Zosyn until final results of cultures are available. At the time of discharge the patient was switched to cefuroxime 500 mg twice a day for 5 more days 3. Multiple sclerosis - plan to continue interferon injections without changes. There is no evidence of multiple sclerosis flare 4. Mild hypokalemia-repleted 5. Diabetes - patient to continue metformin   Procedures:  Head CT  Consultations:  Neurology  Discharge Exam: Filed Vitals:   12/30/11 0704  BP: 157/96  Pulse: 94   Temp: 98.2 F (36.8 C)  Resp: 18   Filed Vitals:   12/29/11 0857 12/29/11 1349 12/29/11 2118 12/30/11 0704  BP: 127/87 122/83 114/77 157/96  Pulse: 88 79 95 94  Temp:  97.6 F (36.4 C) 98.1 F (36.7 C) 98.2 F (36.8 C)  TempSrc:  Oral Oral Oral  Resp:  20 20 18   Height:      Weight:      SpO2:  100% 99% 94%    General: Alert and oriented x3 Cardiovascular: Regular rate and rhythm Respiratory: Clear to auscultation bilateral  Discharge Instructions  Discharge Orders    Future Orders Please Complete By Expires   Diet - low sodium heart healthy      Increase activity slowly        Medication List  As of 12/30/2011 11:38 AM   TAKE these medications         aspirin 81 MG chewable tablet   Chew 81 mg by mouth daily.      AVONEX PEN 30 MCG/0.5ML injection   Generic drug: interferon beta-1a   Inject 30 mcg into the muscle every 7 (seven) days.      cefUROXime 500 MG tablet   Commonly known as: CEFTIN   Take 1 tablet (500 mg total) by mouth 2 (two) times daily.      cholecalciferol 1000 UNITS tablet   Commonly known as: VITAMIN D   Take 1,000 Units by mouth daily.      cloNIDine 0.1 MG tablet   Commonly known as: CATAPRES   Take 0.1 mg by mouth daily.      Cranberry 400 MG Caps   Take 1 capsule  by mouth daily.      ferrous sulfate 325 (65 FE) MG tablet   Take 325 mg by mouth daily with breakfast.      gabapentin 300 MG capsule   Commonly known as: NEURONTIN   Take 300 mg by mouth every 12 (twelve) hours.      ibuprofen 800 MG tablet   Commonly known as: ADVIL,MOTRIN   Take 800 mg by mouth every 8 (eight) hours as needed. For pain      lisinopril 20 MG tablet   Commonly known as: PRINIVIL,ZESTRIL   Take 20 mg by mouth daily.      metFORMIN 500 MG tablet   Commonly known as: GLUCOPHAGE   Take 500 mg by mouth 2 (two) times daily with a meal.      oxybutynin 15 MG 24 hr tablet   Commonly known as: DITROPAN XL   Take 15 mg by mouth daily.       pravastatin 20 MG tablet   Commonly known as: PRAVACHOL   Take 20 mg by mouth every evening.      SENNA PLUS 8.6-50 MG per tablet   Generic drug: senna-docusate   Take 2 tablets by mouth at bedtime.           Follow-up Information    Follow up with Florentina Jenny, MD.   Contact information:   [phone] [address] Laurell Josephs. [address] [place] [address] [phone]           The results of significant diagnostics from this hospitalization (including imaging, microbiology, ancillary and laboratory) are listed below for reference.    Significant Diagnostic Studies: Dg Chest 2 View  12/28/2011  *RADIOLOGY REPORT*  Clinical Data: Altered mental status  CHEST - 2 VIEW  Comparison: 10/20/2011  Findings: Stable elevation of left diaphragmatic leaflet.  Low lung volumes.  Perihilar and bibasilar interstitial prominence without confluent airspace infiltrate or overt edema.  No definite effusion.  Heart size within normal limits.  Regional bones unremarkable.  IMPRESSION:  1.  Low volumes without acute or superimposed abnormality.  Original Report Authenticated By: Osa Craver, M.D.   Ct Head Wo Contrast  12/28/2011  *RADIOLOGY REPORT*  Clinical Data: Code stroke.  History of multiple sclerosis.  Left- sided weakness.  CT HEAD WITHOUT CONTRAST  Technique:  Contiguous axial images were obtained from the base of the skull through the vertex without contrast.  Comparison: Head CT 10/20/2011.  Findings: Cerebral and cerebellar atrophy is age advanced. Extensive patchy and confluent areas of decreased attenuation throughout the deep and periventricular white matter of the cerebral hemispheres bilaterally, which likely reflect multifocal white matter lesions related to the patient's reported history of multiple sclerosis; alternatively, some of these findings may be related to chronic microvascular ischemic disease of the white matter.  No definite acute intracranial finding.  Specifically,  no definite evidence of acute/subacute cerebral ischemia, no acute intracranial hemorrhage, no focal mass, mass effect, hydrocephalus or abnormal intra or extra-axial fluid collections.  No acute displaced skull fractures are identified.  Visualized paranasal sinuses and mastoids are well pneumatized.  IMPRESSION: 1.  No acute intracranial abnormalities. 2.  Cerebral and cerebellar atrophy with chronic white matter changes, similar to prior examinations, as detailed above.  These results were called by telephone on 12/28/2011 at 03:40 p.m. to Dr. Rubin Payor, who verbally acknowledged these results.  Original Report Authenticated By: Florencia Reasons, M.D.    Microbiology: Recent Results (from the past 240 hour(s))  URINE  CULTURE     Status: Normal   Collection Time   12/28/11  4:34 PM      Component Value Range Status Comment   Specimen Description URINE, CLEAN CATCH   Final    Special Requests unknown   Final    Culture  Setup Time 12/28/2011 17:14   Final    Colony Count >=100,000 COLONIES/ML   Final    Culture ESCHERICHIA COLI   Final    Report Status 12/30/2011 FINAL   Final    Organism ID, Bacteria ESCHERICHIA COLI   Final   CULTURE, BLOOD (ROUTINE X 2)     Status: Normal (Preliminary result)   Collection Time   12/28/11  5:00 PM      Component Value Range Status Comment   Specimen Description BLOOD LEFT ARM   Final    Special Requests BOTTLES DRAWN AEROBIC AND ANAEROBIC 10CC   Final    Culture  Setup Time 12/28/2011 22:53   Final    Culture     Final    Value:        BLOOD CULTURE RECEIVED NO GROWTH TO DATE CULTURE WILL BE HELD FOR 5 DAYS BEFORE ISSUING A FINAL NEGATIVE REPORT   Report Status PENDING   Incomplete   CULTURE, BLOOD (ROUTINE X 2)     Status: Normal (Preliminary result)   Collection Time   12/28/11  5:20 PM      Component Value Range Status Comment   Specimen Description BLOOD LEFT HAND   Final    Special Requests BOTTLES DRAWN AEROBIC AND ANAEROBIC 10CC   Final     Culture  Setup Time 12/28/2011 22:53   Final    Culture     Final    Value:        BLOOD CULTURE RECEIVED NO GROWTH TO DATE CULTURE WILL BE HELD FOR 5 DAYS BEFORE ISSUING A FINAL NEGATIVE REPORT   Report Status PENDING   Incomplete   CSF CULTURE     Status: Normal (Preliminary result)   Collection Time   12/28/11  9:10 PM      Component Value Range Status Comment   Specimen Description CSF   Final    Special Requests NONE   Final    Gram Stain     Final    Value: CYTOSPIN WBC PRESENT, PREDOMINANTLY MONONUCLEAR     NO ORGANISMS SEEN     Performed at [facility]   Culture NO GROWTH   Final    Report Status PENDING   Incomplete   GRAM STAIN     Status: Normal   Collection Time   12/28/11  9:11 PM      Component Value Range Status Comment   Specimen Description CSF   Final    Special Requests NONE   Final    Gram Stain     Final    Value: WBC PRESENT, PREDOMINANTLY MONONUCLEAR     NO ORGANISMS SEEN     CYTOSPIN SLIDE   Report Status 12/28/2011 FINAL   Final   MRSA PCR SCREENING     Status: Normal   Collection Time   12/29/11 11:18 AM      Component Value Range Status Comment   MRSA by PCR NEGATIVE  NEGATIVE Final      Labs: Basic Metabolic Panel:  Lab 12/29/11 2956 12/28/11 1535 12/28/11 1525  NA 142 142 140  K 3.4* 3.8 3.9  CL 105 105 104  CO2 25 --  25  GLUCOSE 111* 149* 157*  BUN 10 11 11   CREATININE 0.68 0.70 0.63  CALCIUM 9.0 -- 9.5  MG -- -- --  PHOS -- -- --   Liver Function Tests:  Lab 12/28/11 1525  AST 18  ALT 10  ALKPHOS 52  BILITOT 0.2*  PROT 7.7  ALBUMIN 3.6   No results found for this basename: LIPASE:5,AMYLASE:5 in the last 168 hours No results found for this basename: AMMONIA:5 in the last 168 hours CBC:  Lab 12/29/11 0540 12/28/11 1535 12/28/11 1525  WBC 7.8 -- 4.7  NEUTROABS 5.5 -- 3.1  HGB 11.4* 12.6 11.7*  HCT 34.2* 37.0 36.6  MCV 76.2* -- 78.4  PLT 191 -- 180   Cardiac Enzymes:  Lab 12/28/11 1526  CKTOTAL 66  CKMB 2.0    CKMBINDEX --  TROPONINI <0.30   BNP: BNP (last 3 results) No results found for this basename: PROBNP:3 in the last 8760 hours CBG:  Lab 12/30/11 0749 12/29/11 2202 12/29/11 1629 12/29/11 1205 12/29/11 0906  GLUCAP 113* 157* 111* 100* 160*    Time coordinating discharge: 45 minutes  Signed:  Jeanita Carneiro  [facility] 12/30/2011, 11:38 AM

## 2011-12-30 NOTE — Progress Notes (Signed)
Clinical Social Worker received notification from MD that pt medically ready for discharge. Clinical Social Worker spoke to [facility] who had reviewed pt clinical information and can accept pt back at discharge. Clinical Social Worker facilitated pt discharge needs including contacting the facility, faxing pt discharge information and confirming facility received and reviewed information, discussing with pt at bedside, and arranging ambulance transportation for pt to [facility]. No further social work needs identified at this time. Clinical Social Worker signing off.   Jacklynn Lewis, MSW, LCSWA  Clinical Social Work [phone]

## 2012-01-01 LAB — CSF CULTURE W GRAM STAIN: Culture: NO GROWTH

## 2012-01-03 LAB — CULTURE, BLOOD (ROUTINE X 2)

## 2012-05-07 ENCOUNTER — Emergency Department (HOSPITAL_COMMUNITY)
Admission: EM | Admit: 2012-05-07 | Discharge: 2012-05-07 | Disposition: A | Discharge status: Home / Self Care | Payer: BC Managed Care – PPO | Attending: Emergency Medicine | Admitting: Emergency Medicine

## 2012-05-07 ENCOUNTER — Encounter (HOSPITAL_COMMUNITY): Payer: Self-pay | Admitting: Emergency Medicine

## 2012-05-07 DIAGNOSIS — F3289 Other specified depressive episodes: Secondary | ICD-10-CM | POA: Insufficient documentation

## 2012-05-07 DIAGNOSIS — IMO0001 Reserved for inherently not codable concepts without codable children: Secondary | ICD-10-CM | POA: Insufficient documentation

## 2012-05-07 DIAGNOSIS — F329 Major depressive disorder, single episode, unspecified: Secondary | ICD-10-CM | POA: Insufficient documentation

## 2012-05-07 DIAGNOSIS — Z8669 Personal history of other diseases of the nervous system and sense organs: Secondary | ICD-10-CM | POA: Insufficient documentation

## 2012-05-07 DIAGNOSIS — M79609 Pain in unspecified limb: Secondary | ICD-10-CM | POA: Insufficient documentation

## 2012-05-07 DIAGNOSIS — Z862 Personal history of diseases of the blood and blood-forming organs and certain disorders involving the immune mechanism: Secondary | ICD-10-CM | POA: Insufficient documentation

## 2012-05-07 DIAGNOSIS — Z7982 Long term (current) use of aspirin: Secondary | ICD-10-CM | POA: Insufficient documentation

## 2012-05-07 DIAGNOSIS — I10 Essential (primary) hypertension: Secondary | ICD-10-CM | POA: Insufficient documentation

## 2012-05-07 DIAGNOSIS — M79605 Pain in left leg: Secondary | ICD-10-CM

## 2012-05-07 DIAGNOSIS — G35 Multiple sclerosis: Secondary | ICD-10-CM | POA: Insufficient documentation

## 2012-05-07 DIAGNOSIS — E559 Vitamin D deficiency, unspecified: Secondary | ICD-10-CM | POA: Insufficient documentation

## 2012-05-07 DIAGNOSIS — E119 Type 2 diabetes mellitus without complications: Secondary | ICD-10-CM | POA: Insufficient documentation

## 2012-05-07 DIAGNOSIS — Z79899 Other long term (current) drug therapy: Secondary | ICD-10-CM | POA: Insufficient documentation

## 2012-05-07 MED ORDER — ACETAMINOPHEN 325 MG PO TABS
975.0000 mg | ORAL_TABLET | Freq: Once | ORAL | Status: AC
  Administered 2012-05-07: 975 mg via ORAL
  Filled 2012-05-07: qty 3

## 2012-05-07 NOTE — Progress Notes (Signed)
VASCULAR LAB PRELIMINARY  PRELIMINARY  PRELIMINARY  PRELIMINARY  Bilateral lower extremity venous Dopplers completed.    Preliminary report:  There is no DvVT or SVT noted in the bilateral lower extremities.  Sacora Hawbaker, RVT 05/07/2012, 2:41 PM

## 2012-05-07 NOTE — ED Notes (Signed)
ZOX:[id number]<AV> Expected date:05/07/12<BR> Expected time:11:44 AM<BR> Means of arrival:Ambulance<BR> Comments:<BR> Leg pain

## 2012-05-07 NOTE — ED Provider Notes (Signed)
History     CSN: [id number]  Arrival date & time 05/07/12  1151   First MD Initiated Contact with Patient 05/07/12 1212      Chief Complaint  Patient presents with  . Leg Pain    (Consider location/radiation/quality/duration/timing/severity/associated sxs/prior treatment) HPI Complains of left lower extremity pain onset this morning gradually not made better or worse by anything states she's had similar pain in the past, treated with Tylenol without adequate pain relief however did not take any Tylenol today. Pain is not made better or worse by anything. No chest pain no shortness of breath no other complaint. No other associated symptoms pain is nonradiating popliteal fossa area, moderate in severity Past Medical History  Diagnosis Date  . Allergic rhinitis   . Diabetes mellitus   . Hypertension   . Hypokalemia   . Depression   . Multiple sclerosis   . Vitamin D deficiency   . Dysmetabolic syndrome X   . Urinary incontinence   . Neuropathic pain   . Family history of anesthesia complication     brother "  . Blood dyscrasia     Past Surgical History  Procedure Date  . Cesarean section   . Abdominal hysterectomy     History reviewed. No pertinent family history.  History  Substance Use Topics  . Smoking status: Never Smoker   . Smokeless tobacco: Never Used  . Alcohol Use: No    OB History    Grav Para Term Preterm Abortions TAB SAB Ect Mult Living                  Review of Systems  Musculoskeletal: Positive for myalgias and gait problem.       Patient is bed bound    Allergies  Review of patient's allergies indicates no known allergies.  Home Medications   Current Outpatient Rx  Name  Route  Sig  Dispense  Refill  . ASPIRIN 81 MG PO CHEW   Oral   Chew 81 mg by mouth daily.           Marland Kitchen VITAMIN D 1000 UNITS PO TABS   Oral   Take 1,000 Units by mouth daily.           Marland Kitchen CLONIDINE HCL 0.1 MG PO TABS   Oral   Take 0.1 mg by mouth daily.         Marland Kitchen CRANBERRY 400 MG PO CAPS   Oral   Take 1 capsule by mouth daily.           Marland Kitchen FERROUS SULFATE 325 (65 FE) MG PO TABS   Oral   Take 325 mg by mouth daily with breakfast.           . GABAPENTIN 300 MG PO CAPS   Oral   Take 300 mg by mouth every 12 (twelve) hours.           . IBUPROFEN 800 MG PO TABS   Oral   Take 800 mg by mouth every 8 (eight) hours as needed. For pain         . INTERFERON BETA-1A 30 MCG/0.5ML IM KIT   Intramuscular   Inject 30 mcg into the muscle every 7 (seven) days.           Marland Kitchen LISINOPRIL 20 MG PO TABS   Oral   Take 20 mg by mouth daily.         Marland Kitchen METFORMIN HCL 500 MG PO TABS  Oral   Take 500 mg by mouth 2 (two) times daily with a meal.           . OXYBUTYNIN CHLORIDE ER 15 MG PO TB24   Oral   Take 15 mg by mouth daily.           Marland Kitchen PRAVASTATIN SODIUM 20 MG PO TABS   Oral   Take 20 mg by mouth every evening.           Bernadette Hoit SODIUM 8.6-50 MG PO TABS   Oral   Take 2 tablets by mouth at bedtime.           BP 109/87  Pulse 84  Temp 97.6 F (36.4 C) (Oral)  Resp 16  SpO2 98%  Physical Exam  Nursing note and vitals reviewed. Constitutional: She is oriented to person, place, and time. No distress.       Chronically ill-appearing  HENT:  Head: Normocephalic and atraumatic.  Eyes: Conjunctivae normal are normal. Pupils are equal, round, and reactive to light.  Neck: Neck supple. No tracheal deviation present. No thyromegaly present.  Cardiovascular: Normal rate and regular rhythm.   No murmur heard. Pulmonary/Chest: Effort normal and breath sounds normal.  Abdominal: Soft. Bowel sounds are normal. She exhibits no distension. There is no tenderness.  Musculoskeletal: Normal range of motion. She exhibits no edema and no tenderness.       Extremity minimally tender popliteal fossa no swelling no redness no deformity DP pulse 2+ all other extremities nontender neurovascularly intact  Neurological: She  is alert and oriented to person, place, and time. Coordination normal.  Skin: Skin is warm and dry. No rash noted.       No decubitus ulcer or lesion  Psychiatric: She has a normal mood and affect.    ED Course  Procedures (including critical care time)  Labs Reviewed - No data to display No results found.  Results for orders placed during the hospital encounter of 05/07/12  D-DIMER, QUANTITATIVE      Component Value Range   D-Dimer, Quant 0.74 (*) 0.00 - 0.48 ug/mL-FEU   No results found.  No diagnosis found.  3 PM feels improved after treatment with Tylenol. Doppler study of left lower extremity report is negative to me by the technician MDM  Pain felt to be nonspecific. No signs of vascular compromise or infection Plan Tylenol when necessary pain Diagnosis left lower extremity pain        Doug Sou, MD 05/07/12 1505

## 2012-05-07 NOTE — ED Notes (Signed)
Pt via EMS for c/o leg pain started at 0400.Pt has hx MS. Pt resides at [facility]

## 2012-08-30 IMAGING — CR DG CHEST 1V PORT
1 series · 1 of 1 positions shown · non-contrast
Comparison: 06/07/2010

CLINICAL DATA: Cough and fever.

PORTABLE CHEST - 1 VIEW

[AP]
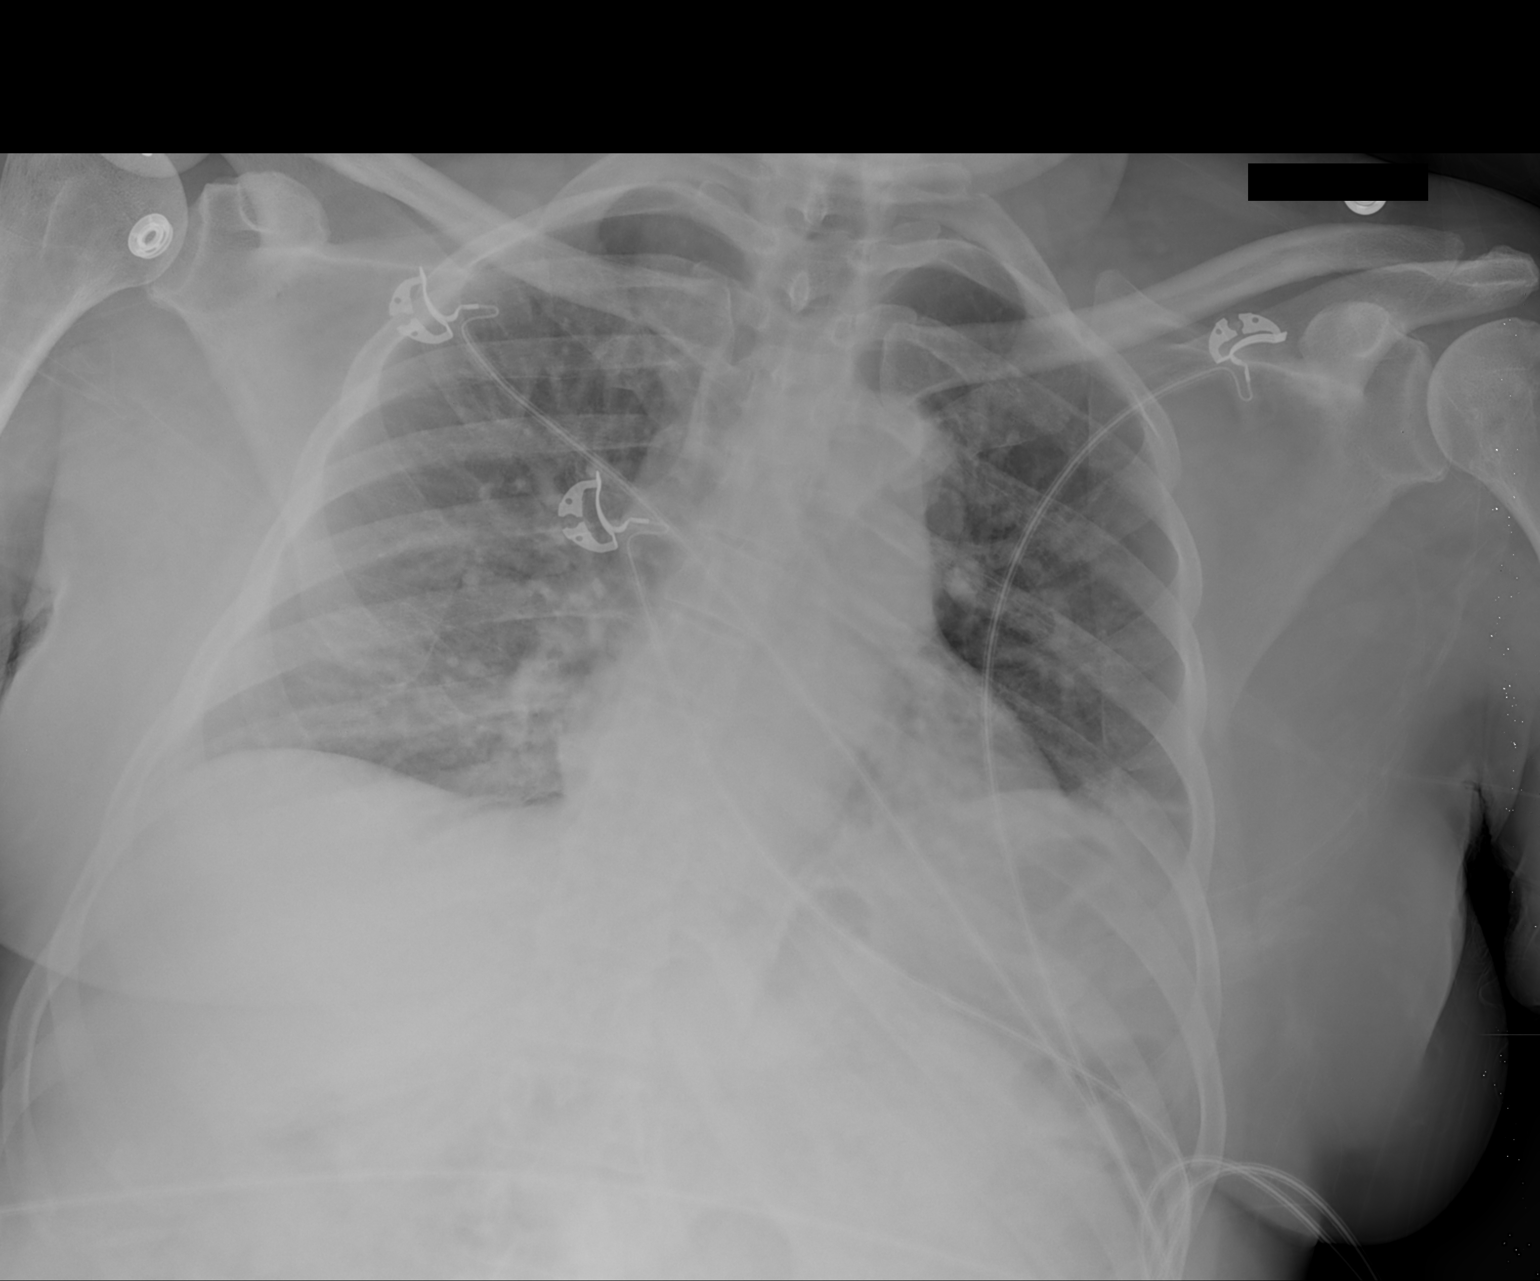

[1 of 1 positions shown; findings below may reference images not displayed]

FINDINGS: 0074 hours.  Low volumes. The cardiopericardial
silhouette is within normal limits for size. There is pulmonary
vascular congestion without overt pulmonary edema.  Left base
atelectasis noted. Telemetry leads overlie the chest.
IMPRESSION: Low volume film with pulmonary vascular congestion.

## 2013-11-22 ENCOUNTER — Emergency Department (HOSPITAL_COMMUNITY): Payer: BC Managed Care – PPO

## 2013-11-22 ENCOUNTER — Emergency Department (HOSPITAL_COMMUNITY)
Admission: EM | Admit: 2013-11-22 | Discharge: 2013-11-22 | Disposition: A | Discharge status: Home / Self Care | Payer: BC Managed Care – PPO | Attending: Emergency Medicine | Admitting: Emergency Medicine

## 2013-11-22 ENCOUNTER — Encounter (HOSPITAL_COMMUNITY): Payer: Self-pay | Admitting: Emergency Medicine

## 2013-11-22 DIAGNOSIS — Z8489 Family history of other specified conditions: Secondary | ICD-10-CM | POA: Insufficient documentation

## 2013-11-22 DIAGNOSIS — S0990XA Unspecified injury of head, initial encounter: Secondary | ICD-10-CM | POA: Diagnosis present

## 2013-11-22 DIAGNOSIS — Y9389 Activity, other specified: Secondary | ICD-10-CM | POA: Diagnosis not present

## 2013-11-22 DIAGNOSIS — Y9289 Other specified places as the place of occurrence of the external cause: Secondary | ICD-10-CM | POA: Diagnosis not present

## 2013-11-22 DIAGNOSIS — Z79899 Other long term (current) drug therapy: Secondary | ICD-10-CM | POA: Insufficient documentation

## 2013-11-22 DIAGNOSIS — E119 Type 2 diabetes mellitus without complications: Secondary | ICD-10-CM | POA: Insufficient documentation

## 2013-11-22 DIAGNOSIS — Z7982 Long term (current) use of aspirin: Secondary | ICD-10-CM | POA: Diagnosis not present

## 2013-11-22 DIAGNOSIS — W050XXA Fall from non-moving wheelchair, initial encounter: Secondary | ICD-10-CM | POA: Diagnosis not present

## 2013-11-22 DIAGNOSIS — E559 Vitamin D deficiency, unspecified: Secondary | ICD-10-CM | POA: Diagnosis not present

## 2013-11-22 DIAGNOSIS — Z791 Long term (current) use of non-steroidal anti-inflammatories (NSAID): Secondary | ICD-10-CM | POA: Insufficient documentation

## 2013-11-22 DIAGNOSIS — Z8709 Personal history of other diseases of the respiratory system: Secondary | ICD-10-CM | POA: Diagnosis not present

## 2013-11-22 DIAGNOSIS — I10 Essential (primary) hypertension: Secondary | ICD-10-CM | POA: Diagnosis not present

## 2013-11-22 DIAGNOSIS — W19XXXA Unspecified fall, initial encounter: Secondary | ICD-10-CM

## 2013-11-22 DIAGNOSIS — R32 Unspecified urinary incontinence: Secondary | ICD-10-CM | POA: Diagnosis not present

## 2013-11-22 DIAGNOSIS — Z8669 Personal history of other diseases of the nervous system and sense organs: Secondary | ICD-10-CM | POA: Insufficient documentation

## 2013-11-22 DIAGNOSIS — Z862 Personal history of diseases of the blood and blood-forming organs and certain disorders involving the immune mechanism: Secondary | ICD-10-CM | POA: Diagnosis not present

## 2013-11-22 NOTE — ED Provider Notes (Signed)
CSN: [id number]     Arrival date & time 11/22/13  1118 History   First MD Initiated Contact with Patient 11/22/13 1215     Chief Complaint  Patient presents with  . Fall     (Consider location/radiation/quality/duration/timing/severity/associated sxs/prior Treatment) HPI [name] is a 58 y.o. female who presents to emergency department complaining of a fall. Patient is wheelchair-bound, chronic weakness due to prior strokes. States she was on her way to the cafeteria to eat lunch when she leaned over to reach for something and states she fell out of her wheelchair. Patient reports hitting her head. Patient reports to [place] that she had loss of consciousness, however he denies denies it. Patient states she's having pain to the back of her head. She denies any other complaints. She denies any dizziness, lightheadedness, pain in her back, pain in her extremities. She was not treated prior to coming in.   Past Medical History  Diagnosis Date  . Allergic rhinitis   . Diabetes mellitus   . Hypertension   . Hypokalemia   . Depression   . Multiple sclerosis   . Vitamin D deficiency   . Dysmetabolic syndrome X   . Urinary incontinence   . Neuropathic pain   . Family history of anesthesia complication     brother "  . Blood dyscrasia    Past Surgical History  Procedure Laterality Date  . Cesarean section    . Abdominal hysterectomy     History reviewed. No pertinent family history. History  Substance Use Topics  . Smoking status: Never Smoker   . Smokeless tobacco: Never Used  . Alcohol Use: No   OB History   Grav Para Term Preterm Abortions TAB SAB Ect Mult Living                 Review of Systems  Constitutional: Negative for fever and chills.  Respiratory: Negative for cough, chest tightness and shortness of breath.   Cardiovascular: Negative for chest pain, palpitations and leg swelling.  Gastrointestinal: Negative for nausea, vomiting, abdominal pain and diarrhea.   Genitourinary: Negative for dysuria and flank pain.  Musculoskeletal: Negative for arthralgias, myalgias, neck pain and neck stiffness.  Skin: Negative for rash.  Neurological: Positive for headaches. Negative for dizziness and weakness.  All other systems reviewed and are negative.     Allergies  Review of patient's allergies indicates no known allergies.  Home Medications   Prior to Admission medications   Medication Sig Start Date End Date Taking? Authorizing Provider  aspirin 81 MG chewable tablet Chew 81 mg by mouth daily.     Yes Historical Provider, MD  atorvastatin (LIPITOR) 20 MG tablet Take 20 mg by mouth at bedtime.   Yes Historical Provider, MD  cholecalciferol (VITAMIN D) 400 UNITS TABS tablet Take 800 Units by mouth daily.   Yes Historical Provider, MD  cloNIDine (CATAPRES) 0.1 MG tablet Take 0.1 mg by mouth daily.    Yes Historical Provider, MD  Cranberry 475 MG CAPS Take 475 mg by mouth daily.   Yes Historical Provider, MD  ferrous sulfate 325 (65 FE) MG tablet Take 325 mg by mouth daily with breakfast.     Yes Historical Provider, MD  gabapentin (NEURONTIN) 300 MG capsule Take 300 mg by mouth every 12 (twelve) hours.     Yes Historical Provider, MD  interferon beta-1a (AVONEX PEN) 30 MCG/0.5ML injection Inject 30 mcg into the muscle every 7 (seven) days.     Yes  Historical Provider, MD  lisinopril (PRINIVIL,ZESTRIL) 20 MG tablet Take 20 mg by mouth daily.   Yes Historical Provider, MD  metFORMIN (GLUCOPHAGE) 500 MG tablet Take 500 mg by mouth 2 (two) times daily with a meal.     Yes Historical Provider, MD  metoprolol succinate (TOPROL-XL) 25 MG 24 hr tablet Take 25 mg by mouth daily.   Yes Historical Provider, MD  polyethylene glycol (MIRALAX / GLYCOLAX) packet Take 17 g by mouth daily. Mix with 8 oz water   Yes Historical Provider, MD  psyllium (REGULOID) 0.52 G capsule Take 0.52 g by mouth daily.   Yes Historical Provider, MD  senna-docusate (SENNA PLUS) 8.6-50 MG  per tablet Take 2 tablets by mouth at bedtime.   Yes Historical Provider, MD  ibuprofen (ADVIL,MOTRIN) 800 MG tablet Take 800 mg by mouth every 8 (eight) hours as needed. For pain    Historical Provider, MD   BP 151/83  Pulse 69  Temp(Src) 98.3 F (36.8 C) (Oral)  Resp 16  SpO2 96% Physical Exam  Nursing note and vitals reviewed. Constitutional: She is oriented to person, place, and time. She appears well-developed and well-nourished. No distress.  HENT:  Head: Normocephalic.  Eyes: Conjunctivae and EOM are normal. Pupils are equal, round, and reactive to light.  Neck: Normal range of motion. Neck supple.  No midline cervical spine tenderness  Cardiovascular: Normal rate, regular rhythm and normal heart sounds.   Pulmonary/Chest: Effort normal and breath sounds normal. No respiratory distress. She has no wheezes. She has no rales.  Abdominal: Soft. Bowel sounds are normal. She exhibits no distension. There is no tenderness. There is no rebound.  Musculoskeletal: She exhibits no edema.  No midline thoracic or lumbar spine tenderness. No pelvis tenderness. Full ROM of bilateral hips, knees. Full ROM of bilateral upper extremities.   Neurological: She is alert and oriented to person, place, and time.  Skin: Skin is warm and dry.  Psychiatric: She has a normal mood and affect. Her behavior is normal.    ED Course  Procedures (including critical care time) Labs Review Labs Reviewed - No data to display  Imaging Review Ct Head Wo Contrast  11/22/2013   CLINICAL DATA:  Recent traumatic injury with pain  EXAM: CT HEAD WITHOUT CONTRAST  CT CERVICAL SPINE WITHOUT CONTRAST  TECHNIQUE: Multidetector CT imaging of the head and cervical spine was performed following the standard protocol without intravenous contrast. Multiplanar CT image reconstructions of the cervical spine were also generated.  COMPARISON:  12/28/2011  FINDINGS: CT HEAD FINDINGS  The bony calvarium is intact. Diffuse atrophic  and chronic white matter ischemic changes are seen. No findings to suggest acute hemorrhage, acute infarction or space-occupying mass lesion are noted.  CT CERVICAL SPINE FINDINGS  Seven cervical segments are well visualized. Vertebral body height is well maintained. Some cystic degeneration is noted in the posterior inferior aspect of C6 consistent with degenerative change and likely Schmorl's node. No acute fracture or acute bony abnormality is noted. The surrounding soft tissue structures as well as the visualized lung apices are within normal limits.  IMPRESSION: CT of the head: Chronic changes without acute abnormality.  CT of the cervical spine: Degenerative changes without acute abnormality.   Electronically Signed   By: Alcide CleverMark  Lukens M.D.   On: 11/22/2013 15:51   Ct Cervical Spine Wo Contrast  11/22/2013   CLINICAL DATA:  Recent traumatic injury with pain  EXAM: CT HEAD WITHOUT CONTRAST  CT CERVICAL SPINE WITHOUT CONTRAST  TECHNIQUE: Multidetector CT imaging of the head and cervical spine was performed following the standard protocol without intravenous contrast. Multiplanar CT image reconstructions of the cervical spine were also generated.  COMPARISON:  12/28/2011  FINDINGS: CT HEAD FINDINGS  The bony calvarium is intact. Diffuse atrophic and chronic white matter ischemic changes are seen. No findings to suggest acute hemorrhage, acute infarction or space-occupying mass lesion are noted.  CT CERVICAL SPINE FINDINGS  Seven cervical segments are well visualized. Vertebral body height is well maintained. Some cystic degeneration is noted in the posterior inferior aspect of C6 consistent with degenerative change and likely Schmorl's node. No acute fracture or acute bony abnormality is noted. The surrounding soft tissue structures as well as the visualized lung apices are within normal limits.  IMPRESSION: CT of the head: Chronic changes without acute abnormality.  CT of the cervical spine: Degenerative  changes without acute abnormality.   Electronically Signed   By: Alcide Clever M.D.   On: 11/22/2013 15:51     EKG Interpretation None      MDM   Final diagnoses:  Fall, initial encounter  Head injury, initial encounter   Patient in emergency department after a mechanical fall at of her wheelchair. She reports head pain. Exam is unremarkable. Will get CT of her head and cervical spine, patient does admit to loss of consciousness. Patient is currently in no acute distress.   4:01 PM CT of the head and cervical spine negative for acute injury. We'll discharge home. Instructed to followup closely, precautions provided.  Filed Vitals:   11/22/13 1300 11/22/13 1322 11/22/13 1330 11/22/13 1415  BP: 133/79 137/83 138/87 138/96  Pulse: 73 79 76 71  Temp:      TempSrc:      Resp:      SpO2: 100% 100% 99% 100%       Lottie Mussel, PA-C 11/22/13 2029

## 2013-11-22 NOTE — ED Notes (Signed)
Patient transported to CT 

## 2013-11-22 NOTE — ED Notes (Signed)
Pt transferred to nh via p[facility]. Pt alert x4

## 2013-11-22 NOTE — ED Notes (Signed)
Pt incontinent of stool. Pt cleaned and repositioned. Pt denies any complaints

## 2013-11-22 NOTE — Discharge Instructions (Signed)
Take Tylenol or Motrin for pain as needed. Followup with primary care doctor. CTs normal today.   Head Injury You have received a head injury. It does not appear serious at this time. Headaches and vomiting are common following head injury. It should be easy to awaken from sleeping. Sometimes it is necessary for you to stay in the emergency department for a while for observation. Sometimes admission to the hospital may be needed. After injuries such as yours, most problems occur within the first 24 hours, but side effects may occur up to 7-10 days after the injury. It is important for you to carefully monitor your condition and contact your health care provider or seek immediate medical care if there is a change in your condition. WHAT ARE THE TYPES OF HEAD INJURIES? Head injuries can be as minor as a bump. Some head injuries can be more severe. More severe head injuries include:  A jarring injury to the brain (concussion).  A bruise of the brain (contusion). This mean there is bleeding in the brain that can cause swelling.  A cracked skull (skull fracture).  Bleeding in the brain that collects, clots, and forms a bump (hematoma). WHAT CAUSES A HEAD INJURY? A serious head injury is most likely to happen to someone who is in a car wreck and is not wearing a seat belt. Other causes of major head injuries include bicycle or motorcycle accidents, sports injuries, and falls. HOW ARE HEAD INJURIES DIAGNOSED? A complete history of the event leading to the injury and your current symptoms will be helpful in diagnosing head injuries. Many times, pictures of the brain, such as CT or MRI are needed to see the extent of the injury. Often, an overnight hospital stay is necessary for observation.  WHEN SHOULD I SEEK IMMEDIATE MEDICAL CARE?  You should get help right away if:  You have confusion or drowsiness.  You feel sick to your stomach (nauseous) or have continued, forceful vomiting.  You have  dizziness or unsteadiness that is getting worse.  You have severe, continued headaches not relieved by medicine. Only take over-the-counter or prescription medicines for pain, fever, or discomfort as directed by your health care provider.  You do not have normal function of the arms or legs or are unable to walk.  You notice changes in the black spots in the center of the colored part of your eye (pupil).  You have a clear or bloody fluid coming from your nose or ears.  You have a loss of vision. During the next 24 hours after the injury, you must stay with someone who can watch you for the warning signs. This person should contact local emergency services (911 in the [place] if you have seizures, you become unconscious, or you are unable to wake up. HOW CAN I PREVENT A HEAD INJURY IN THE FUTURE? The most important factor for preventing major head injuries is avoiding motor vehicle accidents. To minimize the potential for damage to your head, it is crucial to wear seat belts while riding in motor vehicles. Wearing helmets while bike riding and playing collision sports (like football) is also helpful. Also, avoiding dangerous activities around the house will further help reduce your risk of head injury.  WHEN CAN I RETURN TO NORMAL ACTIVITIES AND ATHLETICS? You should be reevaluated by your health care provider before returning to these activities. If you have any of the following symptoms, you should not return to activities or contact sports until 1 week after the  symptoms have stopped:  Persistent headache.  Dizziness or vertigo.  Poor attention and concentration.  Confusion.  Memory problems.  Nausea or vomiting.  Fatigue or tire easily.  Irritability.  Intolerant of bright lights or loud noises.  Anxiety or depression.  Disturbed sleep. MAKE SURE YOU:   Understand these instructions.  Will watch your condition.  Will get help right away if you are not doing well or get  worse. Document Released: 05/03/2005 Document Revised: 05/08/2013 Document Reviewed: 01/08/2013 [facility] Patient Information 2015 [place], [place]. This information is not intended to replace advice given to you by your health care provider. Make sure you discuss any questions you have with your health care provider.

## 2013-11-22 NOTE — ED Notes (Signed)
Pt alert x4 waiting for ptar to return to [facility]

## 2013-11-22 NOTE — ED Notes (Addendum)
Pt to ED via [facility], from [facility], for eval after falling from wheelchair with no injury. Pt c/o feeling tired. Hx of MS. cbg 101 per ems. Pt A&O x4. Appears in NAD. Pt is wheelchair bound due to hx of stroke and MS.

## 2013-11-23 NOTE — ED Provider Notes (Signed)
Medical screening examination/treatment/procedure(s) were performed by non-physician practitioner and as supervising physician I was immediately available for consultation/collaboration.    Jonatan Wilsey, MD 11/23/13 1556 

## 2014-09-24 ENCOUNTER — Ambulatory Visit: Payer: Medicare Other | Admitting: Neurology

## 2014-09-26 ENCOUNTER — Encounter: Payer: Self-pay | Admitting: Neurology

## 2014-09-26 ENCOUNTER — Telehealth: Payer: Self-pay | Admitting: Neurology

## 2014-09-26 NOTE — Telephone Encounter (Signed)
09/24/14 NP appt no showed w/ Dr. Everlena Cooper. No show letter + policy mailed to pt. Copy of letter CC'ed to L. Polite's office, referring provider via [employer] routing function / Sherri S.

## 2015-01-17 ENCOUNTER — Ambulatory Visit (INDEPENDENT_AMBULATORY_CARE_PROVIDER_SITE_OTHER): Payer: Medicare Other | Admitting: Neurology

## 2015-01-17 ENCOUNTER — Encounter: Payer: Self-pay | Admitting: Neurology

## 2015-01-17 VITALS — BP 140/72 | HR 60 | Resp 16 | Ht 66.0 in | Wt 174.0 lb

## 2015-01-17 DIAGNOSIS — G35C0 Secondary progressive multiple sclerosis, unspecified: Secondary | ICD-10-CM

## 2015-01-17 DIAGNOSIS — I679 Cerebrovascular disease, unspecified: Secondary | ICD-10-CM

## 2015-01-17 DIAGNOSIS — G35 Multiple sclerosis: Secondary | ICD-10-CM

## 2015-01-17 NOTE — Patient Instructions (Addendum)
At this point, there are no disease-modifying agents that would be helpful in treating the MS.  I would concentrate on supportive care and management of secondary stroke risk factors as managed by PCP 1.  Asa  daily 2.  Optimize blood pressure an glycemic control 3.  Continue statin.  LDL goal should be less than 70 4.  PT and OT 5.  We will check vitamin D level 6.  Follow up in 9 months

## 2015-01-17 NOTE — Progress Notes (Signed)
NEUROLOGY CONSULTATION NOTE  [name] MRN: [id number] DOB: 1956/01/30  Referring provider: Dr. Redmond School Primary care provider: Ms. Nehemiah Settle  Reason for consult:  MS  HISTORY OF PRESENT ILLNESS: [name] is a 59 year old right-handed woman with depression, heart disease, hyperlipidemia, hypertension, cerebrovascular disease with prior stroke and multiple sclerosis who presents for multiple sclerosis.  Limited records from PCP reviewed.  Patient is poor historian.  MRI of brain in 2013 and brain and cervical spine from 2008 personally reviewed.  Labs from hospitalization in 2013 reviewed.  She was diagnosed with MS in 1977 after experiencing headaches.  She cannot elaborate further.  Workup included a spinal tap.  She reports that she would have flare-ups off and on for several years.  She was on Avonex until March 2016, because her insurance would no longer cover it.  She cannot tell me if she had been on any other disease-modifying agents.  She currently resides in a facility.  She has had gradual progression of chronic disability over the past few years.  Chronic symptoms include: Fecal incontinence since 2012 Urinary incontinence since August 2014 Generalized and prominent left sided weakness, requiring dependence of wheelchair since December 2014  She takes gabapentin.  She denies pain or muscle spasms.  She has history of stroke and some of her left sided weakness may be contributed by this.  Most recent MRI of the brain with and without contrast in her records was from 10/20/11, which showed severe atrophy with extensive chronic cerebral, cerebellar and brainstem white matter disease.  She underwent an LP in August 2013 for altered mental status, which showed normal cell count, protein and glucose and negative for herpes.  She did have prior MRI of brain and cervical spine performed in July 2008 which again showed confluent white matter disease in the cerebrum, cerebellum,  brainstem, as well as cervical spinal cord.  PAST MEDICAL HISTORY: Past Medical History  Diagnosis Date  . Allergic rhinitis   . Diabetes mellitus   . Hypertension   . Hypokalemia   . Depression   . Multiple sclerosis   . Vitamin D deficiency   . Dysmetabolic syndrome X   . Urinary incontinence   . Neuropathic pain   . Family history of anesthesia complication     brother "  . Blood dyscrasia   . Dementia     PAST SURGICAL HISTORY: Past Surgical History  Procedure Laterality Date  . Cesarean section    . Abdominal hysterectomy      MEDICATIONS: Current Outpatient Prescriptions on File Prior to Visit  Medication Sig Dispense Refill  . aspirin 81 MG chewable tablet Chew 81 mg by mouth daily.      Marland Kitchen atorvastatin (LIPITOR) 20 MG tablet Take 20 mg by mouth at bedtime.    . cholecalciferol (VITAMIN D) 400 UNITS TABS tablet Take 800 Units by mouth daily.    . cloNIDine (CATAPRES) 0.1 MG tablet Take 0.1 mg by mouth daily.     . Cranberry 475 MG CAPS Take 475 mg by mouth daily.    . ferrous sulfate 325 (65 FE) MG tablet Take 325 mg by mouth daily with breakfast.      . gabapentin (NEURONTIN) 300 MG capsule Take 300 mg by mouth every 12 (twelve) hours.      Marland Kitchen ibuprofen (ADVIL,MOTRIN) 800 MG tablet Take 800 mg by mouth every 8 (eight) hours as needed. For pain    . interferon beta-1a (AVONEX PEN) 30 MCG/0.5ML injection  Inject 30 mcg into the muscle every 7 (seven) days.      Marland Kitchen lisinopril (PRINIVIL,ZESTRIL) 20 MG tablet Take 20 mg by mouth daily.    . metFORMIN (GLUCOPHAGE) 500 MG tablet Take 500 mg by mouth 2 (two) times daily with a meal.      . metoprolol succinate (TOPROL-XL) 25 MG 24 hr tablet Take 25 mg by mouth daily.    . polyethylene glycol (MIRALAX / GLYCOLAX) packet Take 17 g by mouth daily. Mix with 8 oz water    . psyllium (REGULOID) 0.52 G capsule Take 0.52 g by mouth daily.    Marland Kitchen senna-docusate (SENNA PLUS) 8.6-50 MG per tablet Take 2 tablets by mouth at bedtime.      No current facility-administered medications on file prior to visit.    ALLERGIES: No Known Allergies  FAMILY HISTORY: Family History  Problem Relation Age of Onset  . Multiple sclerosis Brother     SOCIAL HISTORY: Social History   Social History  . Marital Status: Legally Separated    Spouse Name: N/A  . Number of Children: N/A  . Years of Education: N/A   Occupational History  . Not on file.   Social History Main Topics  . Smoking status: Never Smoker   . Smokeless tobacco: Never Used  . Alcohol Use: 0.0 oz/week    0 Standard drinks or equivalent per week     Comment: former   . Drug Use: No  . Sexual Activity: No   Other Topics Concern  . Not on file   Social History Narrative    REVIEW OF SYSTEMS: Constitutional: No fevers, chills, or sweats, no generalized fatigue, change in appetite Eyes: No visual changes, double vision, eye pain Ear, nose and throat: No hearing loss, ear pain, nasal congestion, sore throat Cardiovascular: No chest pain, palpitations Respiratory:  No shortness of breath at rest or with exertion, wheezes GastrointestinaI: No nausea, vomiting, diarrhea, abdominal pain, fecal incontinence Genitourinary:  No dysuria, urinary retention or frequency Musculoskeletal:  No neck pain, back pain Integumentary: No rash, pruritus, skin lesions Neurological: as above Psychiatric: No depression, insomnia, anxiety Endocrine: No palpitations, fatigue, diaphoresis, mood swings, change in appetite, change in weight, increased thirst Hematologic/Lymphatic:  No anemia, purpura, petechiae. Allergic/Immunologic: no itchy/runny eyes, nasal congestion, recent allergic reactions, rashes  PHYSICAL EXAM: Filed Vitals:   01/17/15 0930  BP: 140/72  Pulse: 60  Resp: 16   General: No acute distress.   Head:  Normocephalic/atraumatic Eyes:  fundi unremarkable, without vessel changes, exudates, hemorrhages or papilledema. Neck: supple, no paraspinal  tenderness, full range of motion Back: No paraspinal tenderness Heart: regular rate and rhythm Lungs: Clear to auscultation bilaterally. Vascular: No carotid bruits. Neurological Exam: Mental status: alert and oriented to person and place.  She is oriented to state, town and floor, recent more than remote memory impaired, fund of knowledge intact, attention and concentration impaired, speech fluent and not dysarthric, language intact. MMSE - Mini Mental State Exam 01/17/2015  Orientation to time 2  Orientation to Place 4  Registration 2  Attention/ Calculation 5  Recall 0  Language- name 2 objects 2  Language- repeat 1  Language- follow 3 step command 3  Language- read & follow direction 1  Write a sentence 0  Copy design 0  Total score 20   Cranial nerves: CN I: not tested CN II: pupils equal, round and reactive to light, visual fields intact, fundi unremarkable, without vessel changes, exudates, hemorrhages or papilledema. CN III,  IV, VI:  full range of motion, no nystagmus, no ptosis CN V: facial sensation intact CN VII: upper and lower face symmetric CN VIII: hearing intact CN IX, X: gag intact, uvula midline CN XI: sternocleidomastoid and trapezius muscles intact CN XII: tongue midline Bulk & Tone: increased in lower extremities, no fasciculations. Motor:  4-/5 left triceps, 3+/5 left hip flexion, 5-/5 left ankle dorsiflexion and both hamstrings.  Otherwise, 5/5 Sensation:  Pinprick and vibration sensation intact. Deep Tendon Reflexes:  Trace throughout, toes downgoing.  Finger to nose testing: slowed with mild pass pointing Heel to shin:  Unable to performe Gait:  In wheelchair.  Unable to stand or ambulate.  IMPRESSION: Secondary progressive multiple sclerosis Cerebrovascular disease Neurocognitive impairment, likely secondary to MS  PLAN: Unfortunately, disease-modifying agents would not be effective.  Supportive care and management of secondary stroke risk factors  are recommended, which can be addressed by PCP. 1.  PT/OT 2.  ASA 81mg  daily 3.  Blood pressure and glycemic control 4.  Statin (LDL goal should be less than 70) 5.  Continue D3.  Check vitamin D level (should be greater than 50) 6.  Follow up in 9 months.  Thank you for allowing me to take part in the care of this patient.  Shon Millet, DO  CC:  Florentina Jenny, MD  Marlowe Aschoff, FNP

## 2015-10-17 ENCOUNTER — Ambulatory Visit: Payer: Medicare Other | Admitting: Neurology

## 2017-01-21 ENCOUNTER — Encounter (HOSPITAL_COMMUNITY): Payer: Self-pay | Admitting: *Deleted

## 2017-01-21 ENCOUNTER — Emergency Department (HOSPITAL_COMMUNITY)
Admission: EM | Admit: 2017-01-21 | Discharge: 2017-01-22 | Disposition: A | Discharge status: Home / Self Care | Payer: BLUE CROSS/BLUE SHIELD | Attending: Emergency Medicine | Admitting: Emergency Medicine

## 2017-01-21 DIAGNOSIS — G35 Multiple sclerosis: Secondary | ICD-10-CM | POA: Diagnosis not present

## 2017-01-21 DIAGNOSIS — I10 Essential (primary) hypertension: Secondary | ICD-10-CM | POA: Insufficient documentation

## 2017-01-21 DIAGNOSIS — F329 Major depressive disorder, single episode, unspecified: Secondary | ICD-10-CM | POA: Diagnosis not present

## 2017-01-21 DIAGNOSIS — F0390 Unspecified dementia without behavioral disturbance: Secondary | ICD-10-CM | POA: Diagnosis not present

## 2017-01-21 DIAGNOSIS — Z79899 Other long term (current) drug therapy: Secondary | ICD-10-CM | POA: Insufficient documentation

## 2017-01-21 DIAGNOSIS — E119 Type 2 diabetes mellitus without complications: Secondary | ICD-10-CM | POA: Diagnosis not present

## 2017-01-21 DIAGNOSIS — R21 Rash and other nonspecific skin eruption: Secondary | ICD-10-CM | POA: Insufficient documentation

## 2017-01-21 DIAGNOSIS — T7840XA Allergy, unspecified, initial encounter: Secondary | ICD-10-CM | POA: Insufficient documentation

## 2017-01-21 DIAGNOSIS — Z7982 Long term (current) use of aspirin: Secondary | ICD-10-CM | POA: Diagnosis not present

## 2017-01-21 DIAGNOSIS — Z7984 Long term (current) use of oral hypoglycemic drugs: Secondary | ICD-10-CM | POA: Insufficient documentation

## 2017-01-21 MED ORDER — DIPHENHYDRAMINE HCL 25 MG PO CAPS
25.0000 mg | ORAL_CAPSULE | Freq: Once | ORAL | Status: AC
Start: 2017-01-21 — End: 2017-01-22
  Administered 2017-01-22: 25 mg via ORAL
  Filled 2017-01-21: qty 1

## 2017-01-21 MED ORDER — DIPHENHYDRAMINE HCL 25 MG PO TABS: 25.0000 mg | ORAL_TABLET | Freq: Three times a day (TID) | ORAL | 0 refills | Status: DC | PRN

## 2017-01-21 MED ORDER — DEXAMETHASONE SODIUM PHOSPHATE 10 MG/ML IJ SOLN
10.0000 mg | Freq: Once | INTRAMUSCULAR | Status: AC
  Administered 2017-01-22: 10 mg via INTRAMUSCULAR
  Filled 2017-01-21: qty 1

## 2017-01-21 NOTE — ED Notes (Signed)
[facility] called for transport.  

## 2017-01-21 NOTE — ED Provider Notes (Signed)
[facility]-EMERGENCY DEPT Provider Note   CSN: [id number] Arrival date & time: 01/21/17  2201     History   Chief Complaint Chief Complaint  Patient presents with  . Insect Bite    HPI [name] is a 61 y.o. female.  HPI Pt comes in with cc of itching. Pt has hx of MS, HTN, DM and is on interferon treatment. She reports that starting yday she has itch rash over her body. She is not sure what caused it. She denies any new meds. Per nursing records, pt was sent here due to bed bugs, however, pt has not seen any bed bugs. Pt denies any pain. She has no bleeding, oral involvement. No hx of similar symptoms in the past and she also denies any n/v/f/c.  Past Medical History:  Diagnosis Date  . Allergic rhinitis   . Blood dyscrasia   . Dementia   . Depression   . Diabetes mellitus   . Dysmetabolic syndrome X   . Family history of anesthesia complication    brother "  . Hypertension   . Hypokalemia   . Multiple sclerosis (HCC)   . Neuropathic pain   . Urinary incontinence   . Vitamin D deficiency     Patient Active Problem List   Diagnosis Date Noted  . 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 Cerebrovascular disease 01/17/2015  . Encephalopathy, metabolic 12/29/2011  . Pyelonephritis, acute 12/29/2011  . Hypokalemia 12/29/2011  . Depression   . MS (multiple sclerosis) (HCC) 12/28/2011  . Fever 12/28/2011  . DM2 (diabetes mellitus, type 2) (HCC) 12/28/2011    Past Surgical History:  Procedure Laterality Date  . ABDOMINAL HYSTERECTOMY    . CESAREAN SECTION      OB History    No data available       Home Medications    Prior to Admission medications   Medication Sig Start Date End Date Taking? Authorizing Provider  aspirin 81 MG chewable tablet Chew 81 mg by mouth daily.      [provider]  atorvastatin (LIPITOR) 20 MG tablet Take 20 mg by mouth at bedtime.    [provider]  cholecalciferol (VITAMIN D) 400 UNITS  TABS tablet Take 800 Units by mouth daily.    [provider]  cloNIDine (CATAPRES) 0.1 MG tablet Take 0.1 mg by mouth daily.     [provider]  Cranberry 475 MG CAPS Take 475 mg by mouth daily.    [provider]  diphenhydrAMINE (BENADRYL) 25 MG tablet Take 1 tablet (25 mg total) by mouth every 8 (eight) hours as needed for itching. 01/21/17   Derwood Kaplan, MD  ferrous sulfate 325 (65 FE) MG tablet Take 325 mg by mouth daily with breakfast.      [provider]  gabapentin (NEURONTIN) 300 MG capsule Take 300 mg by mouth every 12 (twelve) hours.      [provider]  ibuprofen (ADVIL,MOTRIN) 800 MG tablet Take 800 mg by mouth every 8 (eight) hours as needed. For pain    [provider]  interferon beta-1a (AVONEX PEN) 30 MCG/0.5ML injection Inject 30 mcg into the muscle every 7 (seven) days.      [provider]  lisinopril (PRINIVIL,ZESTRIL) 20 MG tablet Take 20 mg by mouth daily.    [provider]  metFORMIN (GLUCOPHAGE) 500 MG tablet Take 500 mg by mouth 2 (two) times daily with a meal.      [provider]  metoprolol succinate (TOPROL-XL) 25 MG 24 hr tablet Take 25 mg by mouth daily.    [provider]  polyethylene glycol (MIRALAX / GLYCOLAX) packet Take 17 g by mouth daily. Mix with 8 oz water    [provider]  psyllium (REGULOID) 0.52 G capsule Take 0.52 g by mouth daily.    [provider]  senna-docusate (SENNA PLUS) 8.6-50 MG per tablet Take 2 tablets by mouth at bedtime.    [provider]    Family History Family History  Problem Relation Age of Onset  . Multiple sclerosis Brother     Social History Social History  Substance Use Topics  . Smoking status: Never Smoker  . Smokeless tobacco: Never Used  . Alcohol use 0.0 oz/week     Comment: former      Allergies   Patient has no known allergies.   Review of Systems Review of Systems    Constitutional: Negative for activity change and fever.  HENT: Negative for trouble swallowing.   Respiratory: Negative for shortness of breath, wheezing and stridor.   Gastrointestinal: Negative for nausea and vomiting.  Skin: Positive for rash.  Allergic/Immunologic: Positive for immunocompromised state. Negative for environmental allergies and food allergies.     Physical Exam Updated Vital Signs Pulse 74   Temp 97.8 F (36.6 C) (Oral)   Resp 18   Ht  (1.676 m)   Wt 78.9 kg (174 lb)   SpO2 98%   BMI 28.08 kg/m   Physical Exam  Constitutional: She is oriented to person, place, and time. She appears well-developed.  HENT:  Head: Normocephalic and atraumatic.  Eyes: EOM are normal.  Neck: Normal range of motion. Neck supple.  Cardiovascular: Normal rate.   Pulmonary/Chest: Effort normal.  Abdominal: Bowel sounds are normal.  Neurological: She is alert and oriented to person, place, and time.  Skin: Skin is warm and dry. Rash noted. There is erythema.  Pt has macular rash, which is disseminated over the neck, anterior torso and bilateral upper extremities. + blanching. No oral mucosa involvement  Nursing note and vitals reviewed.    ED Treatments / Results  Labs (all labs ordered are listed, but only abnormal results are displayed) Labs Reviewed - No data to display  EKG  EKG Interpretation None       Radiology No results found.  Procedures Procedures (including critical care time)  Medications Ordered in ED Medications  dexamethasone (DECADRON) injection 10 mg (not administered)  diphenhydrAMINE (BENADRYL) capsule 25 mg (not administered)     Initial Impression / Assessment and Plan / ED Course  I have reviewed the triage vital signs and the nursing notes.  Pertinent labs & imaging results that were available during my care of the patient were reviewed by me and considered in my medical decision making (see chart for details).     Pt comes  in with cc of rash. Pt has an itchy, macular rash to the body. She is diabetic and has MS for which she is on immuno modulators - so we considered disseminated zoster and other infectious pathologies, however there is no pain, fevers, bleeding, pustules, crusting etc  -so we doubt those diagnoses based on current evaluation. Pt will be given im decadron and benadryl. ? Bed bugs at SNF - tx wouldn't change from ED perspective.  Strict ER return precautions have been discussed, and patient is agreeing with the plan and is comfortable with the workup done and the recommendations from the  ER.    Final Clinical Impressions(s) / ED Diagnoses   Final diagnoses:  Rash  Hypersensitivity, initial encounter    New Prescriptions New Prescriptions   DIPHENHYDRAMINE (BENADRYL) 25 MG TABLET    Take 1 tablet (25 mg total) by mouth every 8 (eight) hours as needed for itching.     Derwood Kaplan, MD 01/21/17 [phone]

## 2017-01-21 NOTE — ED Notes (Signed)
Pt brought in by EMS for bed bugs. Pt states she has bites on her arms that itch but no other distress.

## 2017-01-21 NOTE — Discharge Instructions (Signed)
We are not sure what caused you to have a rash. We recommend that you see your primary doctor in 1 week for possible further evaluation. Several of your medicine can cause rash as a side effect, including the MS medicine you are on.  We suspect that this is an allergic type reaction given the itching and redness. Take the medicine prescribed.  We gave you a steroid shot in the ER, so that might elevated your blood sugar tomorrow.  Return to the ER if the rash spreads, the skin is sloughing off, there is fevers or rash in the mouth or genital area.

## 2017-01-21 NOTE — ED Triage Notes (Signed)
The pt arrived by [facility] from [facility]  The pt was sent here for bed bugs no other reason.  She has been  Incontinent of urine when the staff saw bed bugs they did not change her just called ems to take to the hospital  Pt alert no distress

## 2017-01-22 DIAGNOSIS — R21 Rash and other nonspecific skin eruption: Secondary | ICD-10-CM | POA: Diagnosis not present

## 2017-01-22 NOTE — ED Notes (Signed)
Attempted to call family and never could get an answer. No voicemail or answering machine connected to number.

## 2017-11-12 ENCOUNTER — Emergency Department (HOSPITAL_COMMUNITY): Payer: BLUE CROSS/BLUE SHIELD

## 2017-11-12 ENCOUNTER — Other Ambulatory Visit: Payer: Self-pay

## 2017-11-12 ENCOUNTER — Inpatient Hospital Stay (HOSPITAL_COMMUNITY)
Admission: EM | Admit: 2017-11-12 | Discharge: 2017-11-15 | DRG: 176 | Disposition: A | Discharge status: Skilled Nursing Facility | Payer: BLUE CROSS/BLUE SHIELD | Attending: Family Medicine | Admitting: Family Medicine

## 2017-11-12 ENCOUNTER — Encounter (HOSPITAL_COMMUNITY): Payer: Self-pay

## 2017-11-12 DIAGNOSIS — E860 Dehydration: Secondary | ICD-10-CM | POA: Diagnosis present

## 2017-11-12 DIAGNOSIS — Z993 Dependence on wheelchair: Secondary | ICD-10-CM

## 2017-11-12 DIAGNOSIS — I82431 Acute embolism and thrombosis of right popliteal vein: Secondary | ICD-10-CM | POA: Diagnosis present

## 2017-11-12 DIAGNOSIS — E870 Hyperosmolality and hypernatremia: Secondary | ICD-10-CM | POA: Diagnosis present

## 2017-11-12 DIAGNOSIS — R000 Tachycardia, unspecified: Secondary | ICD-10-CM | POA: Diagnosis present

## 2017-11-12 DIAGNOSIS — Z7984 Long term (current) use of oral hypoglycemic drugs: Secondary | ICD-10-CM

## 2017-11-12 DIAGNOSIS — I2699 Other pulmonary embolism without acute cor pulmonale: Secondary | ICD-10-CM | POA: Diagnosis present

## 2017-11-12 DIAGNOSIS — I2609 Other pulmonary embolism with acute cor pulmonale: Principal | ICD-10-CM | POA: Diagnosis present

## 2017-11-12 DIAGNOSIS — R0902 Hypoxemia: Secondary | ICD-10-CM | POA: Diagnosis present

## 2017-11-12 DIAGNOSIS — F0390 Unspecified dementia without behavioral disturbance: Secondary | ICD-10-CM | POA: Diagnosis present

## 2017-11-12 DIAGNOSIS — E785 Hyperlipidemia, unspecified: Secondary | ICD-10-CM | POA: Diagnosis present

## 2017-11-12 DIAGNOSIS — Z7982 Long term (current) use of aspirin: Secondary | ICD-10-CM

## 2017-11-12 DIAGNOSIS — G35C0 Secondary progressive multiple sclerosis, unspecified: Secondary | ICD-10-CM | POA: Diagnosis present

## 2017-11-12 DIAGNOSIS — I82411 Acute embolism and thrombosis of right femoral vein: Secondary | ICD-10-CM | POA: Diagnosis present

## 2017-11-12 DIAGNOSIS — R252 Cramp and spasm: Secondary | ICD-10-CM | POA: Diagnosis present

## 2017-11-12 DIAGNOSIS — E119 Type 2 diabetes mellitus without complications: Secondary | ICD-10-CM

## 2017-11-12 DIAGNOSIS — I10 Essential (primary) hypertension: Secondary | ICD-10-CM | POA: Diagnosis present

## 2017-11-12 DIAGNOSIS — G35 Multiple sclerosis: Secondary | ICD-10-CM | POA: Diagnosis present

## 2017-11-12 DIAGNOSIS — Z86718 Personal history of other venous thrombosis and embolism: Secondary | ICD-10-CM

## 2017-11-12 DIAGNOSIS — D72829 Elevated white blood cell count, unspecified: Secondary | ICD-10-CM | POA: Diagnosis present

## 2017-11-12 DIAGNOSIS — E559 Vitamin D deficiency, unspecified: Secondary | ICD-10-CM | POA: Diagnosis present

## 2017-11-12 DIAGNOSIS — I82409 Acute embolism and thrombosis of unspecified deep veins of unspecified lower extremity: Secondary | ICD-10-CM

## 2017-11-12 DIAGNOSIS — Z7401 Bed confinement status: Secondary | ICD-10-CM

## 2017-11-12 DIAGNOSIS — I2510 Atherosclerotic heart disease of native coronary artery without angina pectoris: Secondary | ICD-10-CM | POA: Diagnosis present

## 2017-11-12 DIAGNOSIS — Z79899 Other long term (current) drug therapy: Secondary | ICD-10-CM

## 2017-11-12 LAB — BASIC METABOLIC PANEL
ANION GAP: 11 (ref 5–15)
BUN: 19 mg/dL (ref 8–23)
CO2: 26 mmol/L (ref 22–32)
CREATININE: 0.79 mg/dL (ref 0.44–1.00)
Calcium: 9.4 mg/dL (ref 8.9–10.3)
Chloride: 114 mmol/L — ABNORMAL HIGH (ref 98–111)
Glucose, Bld: 153 mg/dL — ABNORMAL HIGH (ref 70–99)
Potassium: 3.7 mmol/L (ref 3.5–5.1)
SODIUM: 151 mmol/L — AB (ref 135–145)

## 2017-11-12 LAB — URINALYSIS, ROUTINE W REFLEX MICROSCOPIC
Bilirubin Urine: NEGATIVE
GLUCOSE, UA: NEGATIVE mg/dL
Hgb urine dipstick: NEGATIVE
KETONES UR: NEGATIVE mg/dL
NITRITE: NEGATIVE
Protein, ur: NEGATIVE mg/dL
SPECIFIC GRAVITY, URINE: 1.017 (ref 1.005–1.030)
pH: 5 (ref 5.0–8.0)

## 2017-11-12 LAB — CBC
HCT: 42 % (ref 36.0–46.0)
Hemoglobin: 12.7 g/dL (ref 12.0–15.0)
MCH: 25 pg — ABNORMAL LOW (ref 26.0–34.0)
MCHC: 30.2 g/dL (ref 30.0–36.0)
MCV: 82.8 fL (ref 78.0–100.0)
PLATELETS: 255 10*3/uL (ref 150–400)
RBC: 5.07 MIL/uL (ref 3.87–5.11)
RDW: 14.7 % (ref 11.5–15.5)
WBC: 12.1 10*3/uL — AB (ref 4.0–10.5)

## 2017-11-12 LAB — D-DIMER, QUANTITATIVE: D-Dimer, Quant: 17.24 ug/mL-FEU — ABNORMAL HIGH (ref 0.00–0.50)

## 2017-11-12 LAB — TROPONIN I

## 2017-11-12 MED ORDER — SODIUM CHLORIDE 0.9 % IV BOLUS
1000.0000 mL | Freq: Once | INTRAVENOUS | Status: AC
  Administered 2017-11-12: 1000 mL via INTRAVENOUS

## 2017-11-12 MED ORDER — IOPAMIDOL (ISOVUE-300) INJECTION 61%
100.0000 mL | Freq: Once | INTRAVENOUS | Status: AC | PRN
  Administered 2017-11-12: 100 mL via INTRAVENOUS

## 2017-11-12 MED ORDER — IOPAMIDOL (ISOVUE-370) INJECTION 76%: 100.0000 mL | Freq: Once | INTRAVENOUS | Status: DC | PRN

## 2017-11-12 NOTE — ED Notes (Signed)
Pt reminded that a urine sample is requested. Pt states she is unable to give one at this time.

## 2017-11-12 NOTE — ED Triage Notes (Signed)
Patient here from [facility] for right cheek and arm cramping.  Symptoms resolved prior to EMS arriving, facility insisted that patient be transported.  Tachycardic and HTN noted.

## 2017-11-12 NOTE — ED Provider Notes (Signed)
[facility] EMERGENCY DEPARTMENT Provider Note   CSN: [id number] Arrival date & time: 11/12/17  1807     History   Chief Complaint Chief Complaint  Patient presents with  . Multiple Sclerosis    HPI [name] is a 62 y.o. female who presents with right face and arm cramping. PMH significant for MS on interferon treatment, HTN, HLD, DM. She lives at a SNF and is wheelchair bound. Apparently she has this cramping and told the nursing staff at SNF they called 911 and by the time the paramedics got there her symptoms resolved. It lasted ~10 minutes. The patient states that she feels back to baseline and has no complaints. Specifically she denies headache, chest pain, SOB, cough, abdominal pain, N/V/D, or dysuria.    HPI  Past Medical History:  Diagnosis Date  . Allergic rhinitis   . Blood dyscrasia   . Dementia   . Depression   . Diabetes mellitus   . Dysmetabolic syndrome X   . Family history of anesthesia complication    brother "  . Hypertension   . Hypokalemia   . Multiple sclerosis (HCC)   . Neuropathic pain   . Urinary incontinence   . Vitamin D deficiency     Patient Active Problem List   Diagnosis Date Noted  . 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 Cerebrovascular disease 01/17/2015  . Encephalopathy, metabolic 12/29/2011  . Pyelonephritis, acute 12/29/2011  . Hypokalemia 12/29/2011  . Depression   . MS (multiple sclerosis) (HCC) 12/28/2011  . Fever 12/28/2011  . DM2 (diabetes mellitus, type 2) (HCC) 12/28/2011    Past Surgical History:  Procedure Laterality Date  . ABDOMINAL HYSTERECTOMY    . CESAREAN SECTION       OB History   None      Home Medications    Prior to Admission medications   Medication Sig Start Date End Date Taking? Authorizing Provider  aspirin 81 MG chewable tablet Chew 81 mg by mouth daily.      [provider]  atorvastatin (LIPITOR) 20 MG tablet Take 20 mg by mouth at  bedtime.    [provider]  cholecalciferol (VITAMIN D) 400 UNITS TABS tablet Take 800 Units by mouth daily.    [provider]  cloNIDine (CATAPRES) 0.1 MG tablet Take 0.1 mg by mouth daily.     [provider]  Cranberry 475 MG CAPS Take 475 mg by mouth daily.    [provider]  diphenhydrAMINE (BENADRYL) 25 MG tablet Take 1 tablet (25 mg total) by mouth every 8 (eight) hours as needed for itching. 01/21/17   Derwood Kaplan, MD  ferrous sulfate 325 (65 FE) MG tablet Take 325 mg by mouth daily with breakfast.      [provider]  gabapentin (NEURONTIN) 300 MG capsule Take 300 mg by mouth every 12 (twelve) hours.      [provider]  ibuprofen (ADVIL,MOTRIN) 800 MG tablet Take 800 mg by mouth every 8 (eight) hours as needed. For pain    [provider]  interferon beta-1a (AVONEX PEN) 30 MCG/0.5ML injection Inject 30 mcg into the muscle every 7 (seven) days.      [provider]  lisinopril (PRINIVIL,ZESTRIL) 20 MG tablet Take 20 mg by mouth daily.    [provider]  metFORMIN (GLUCOPHAGE) 500 MG tablet Take 500 mg by mouth 2 (two) times daily with a meal.      [provider]  metoprolol succinate (TOPROL-XL) 25 MG 24 hr tablet Take 25 mg by mouth daily.    [provider]  polyethylene glycol (MIRALAX / GLYCOLAX) packet Take 17 g by mouth daily. Mix with 8 oz water    [provider]  psyllium (REGULOID) 0.52 G capsule Take 0.52 g by mouth daily.    [provider]  senna-docusate (SENNA PLUS) 8.6-50 MG per tablet Take 2 tablets by mouth at bedtime.    [provider]    Family History Family History  Problem Relation Age of Onset  . Multiple sclerosis Brother     Social History Social History   Tobacco Use  . Smoking status: Never Smoker  . Smokeless tobacco: Never Used  Substance Use Topics  . Alcohol use: Yes    Alcohol/week: 0.0 oz    Comment: former    . Drug use: No     Allergies   Patient has no known allergies.   Review of Systems Review of Systems  Constitutional: Negative for chills and fever.  Respiratory: Negative for shortness of breath.   Cardiovascular: Negative for chest pain.  Gastrointestinal: Negative for abdominal pain, diarrhea, nausea and vomiting.  Genitourinary: Negative for dysuria.  Musculoskeletal: Positive for myalgias.  Neurological: Negative for headaches.  All other systems reviewed and are negative.    Physical Exam Updated Vital Signs BP (!) 180/134   Pulse (!) 115   Temp (S) 99.6 F (37.6 C) (Rectal)   Resp (!) 27   SpO2 95%   Physical Exam  Constitutional: She is oriented to person, place, and time. She appears well-developed and well-nourished. No distress.  HENT:  Head: Normocephalic and atraumatic.  Eyes: Pupils are equal, round, and reactive to light. Conjunctivae are normal. Right eye exhibits no discharge. Left eye exhibits no discharge. No scleral icterus.  Neck: Normal range of motion.  Cardiovascular: Tachycardia present. Exam reveals no gallop and no friction rub.  No murmur heard. Pulmonary/Chest: Effort normal and breath sounds normal. No respiratory distress.  Abdominal: Soft. Bowel sounds are normal. She exhibits no distension. There is no tenderness.  Musculoskeletal:  No leg swelling  Neurological: She is alert and oriented to person, place, and time.  Skin: Skin is warm and dry.  Psychiatric: She has a normal mood and affect. Her behavior is normal.  Nursing note and vitals reviewed.    ED Treatments / Results  Labs (all labs ordered are listed, but only abnormal results are displayed) Labs Reviewed  CBC - Abnormal; Notable for the following components:      Result Value   WBC 12.1 (*)    MCH 25.0 (*)    All other components within normal limits  BASIC METABOLIC PANEL - Abnormal; Notable for the following components:   Sodium 151 (*)    Chloride 114 (*)     Glucose, Bld 153 (*)    All other components within normal limits  URINALYSIS, ROUTINE W REFLEX MICROSCOPIC - Abnormal; Notable for the following components:   Leukocytes, UA TRACE (*)    Bacteria, UA RARE (*)    All other components within normal limits  D-DIMER, QUANTITATIVE (NOT AT [facility]) - Abnormal; Notable for the following components:   D-Dimer, Quant 17.24 (*)    All other components within normal limits  TROPONIN I    EKG None  Radiology Dg Chest 2 View  Result Date: 11/12/2017 CLINICAL DATA:  Arm pain. EXAM: CHEST - 2 VIEW COMPARISON:  Chest x-ray dated December 28, 2011. FINDINGS: The heart size and mediastinal contours are within normal limits. Normal pulmonary vascularity. No focal consolidation, pleural effusion, or pneumothorax. Unchanged elevation of the left hemidiaphragm. No acute osseous abnormality. IMPRESSION: No active cardiopulmonary disease. Electronically Signed   By: Obie Dredge M.D.   On: 11/12/2017 19:56   Ct Angio Chest Pe W/cm &/or Wo Cm  Result Date: 11/12/2017 CLINICAL DATA:  Right cheek and arm cramping. Chest pain and tachycardia. EXAM: CT ANGIOGRAPHY CHEST CT ABDOMEN AND PELVIS WITH CONTRAST TECHNIQUE: Multidetector CT imaging of the chest was performed using the standard protocol during bolus administration of intravenous contrast. Multiplanar CT image reconstructions and MIPs were obtained to evaluate the vascular anatomy. Multidetector CT imaging of the abdomen and pelvis was performed using the standard protocol during bolus administration of intravenous contrast. CONTRAST:  ISOVUE-300 IOPAMIDOL (ISOVUE-300) INJECTION 61% COMPARISON:  CXR 11/12/2017 FINDINGS: CTA CHEST FINDINGS Cardiovascular: Acute pulmonary emboli at the branch point of the right main pulmonary embolus extending into the right lobar level in the upper lobe and segmental level to the right lower lobe. RV/LV ratio elevated at 0.79. Left ventricular hypertrophy is seen. No pericardial  effusion. No aortic aneurysm or dissection. Mild ectasia of the ascending aorta to 3.7 cm. Common branch point of the right brachiocephalic and left common carotid arteries without stenosis of the great vessels. Coronary arteriosclerosis is noted along the LAD. Mediastinum/Nodes: No mediastinal nor hilar adenopathy. Trachea is unremarkable. Mainstem bronchi are patent. Lungs/Pleura: Dependent atelectasis. No acute pulmonary abnormality. No effusion or pneumothorax. No dominant mass. Elevated left hemidiaphragm. Musculoskeletal: No chest wall abnormality. No acute or significant osseous findings. Review of the MIP images confirms the above findings. CT ABDOMEN and PELVIS FINDINGS Hepatobiliary: No focal liver abnormality is seen. No gallstones, gallbladder wall thickening, or biliary dilatation. Pancreas: Unremarkable. No pancreatic ductal dilatation or surrounding inflammatory changes. Spleen: Normal size spleen without mass. Punctate calcification consistent with a tiny granuloma is noted within. Adrenals/Urinary Tract: Normal bilateral adrenal glands. Symmetric cortical enhancement of kidneys without enhancing mass lesions. No nephrolithiasis nor hydroureteronephrosis. The urinary bladder is unremarkable for the degree of distention. Stomach/Bowel: Small hiatal hernia. Decompressed stomach with normal small bowel rotation. No bowel obstruction or inflammation. The terminal and distal ileum are normal. The appendix is not well visualized but no pericecal inflammatory change is seen. Moderate stool retention is noted within colon. Vascular/Lymphatic: Moderate aortoiliac atherosclerosis without aneurysm. No adenopathy. Reproductive: Hysterectomy.  No adnexal mass. Other: Tiny fat containing umbilical hernia. No free air nor free fluid. Musculoskeletal: Degenerative disc disease L3-4. No acute osseous abnormality. Review of the MIP images confirms the above findings. IMPRESSION: Chest CT: 1. Positive for acute PE to  the right upper and lower lobes with CT evidence of right heart strain (RV/LV Ratio = 0.78) consistent with at least submassive (intermediate risk) PE. The presence of right heart strain has been associated with an increased risk of morbidity and mortality. Please activate Code PE by paging [phone]. These results were called by telephone at the time of interpretation on 11/12/2017 at 11:43 pm to PA [facility] , who verbally acknowledged these results. 2. No active pulmonary disease. 3. Coronary arteriosclerosis along the LAD. 4. Mild aortic atherosclerosis without aneurysm or dissection. CT AP: 1. No acute solid nor hollow visceral organ pathology. 2. Degenerative disc disease L3-4. Electronically Signed   By: Tollie Eth M.D.   On: 11/12/2017 23:44   Ct Abdomen Pelvis W Contrast  Result Date: 11/12/2017 CLINICAL DATA:  Right cheek and arm cramping. Chest pain and tachycardia. EXAM: CT ANGIOGRAPHY CHEST CT ABDOMEN AND PELVIS WITH CONTRAST TECHNIQUE: Multidetector CT imaging of the chest was performed using the standard protocol during bolus administration of intravenous contrast. Multiplanar CT image reconstructions and MIPs were obtained to evaluate the vascular anatomy. Multidetector CT imaging of the abdomen and pelvis was performed using the standard protocol during bolus administration of intravenous contrast. CONTRAST:  ISOVUE-300 IOPAMIDOL (ISOVUE-300) INJECTION 61% COMPARISON:  CXR 11/12/2017 FINDINGS: CTA CHEST FINDINGS Cardiovascular: Acute pulmonary emboli at the branch point of the right main pulmonary embolus extending into the right lobar level in the upper lobe and segmental level to the right lower lobe. RV/LV ratio elevated at 0.79. Left ventricular hypertrophy is seen. No pericardial effusion. No aortic aneurysm or dissection. Mild ectasia of the ascending aorta to 3.7 cm. Common branch point of the right brachiocephalic and left common carotid arteries without stenosis of the great  vessels. Coronary arteriosclerosis is noted along the LAD. Mediastinum/Nodes: No mediastinal nor hilar adenopathy. Trachea is unremarkable. Mainstem bronchi are patent. Lungs/Pleura: Dependent atelectasis. No acute pulmonary abnormality. No effusion or pneumothorax. No dominant mass. Elevated left hemidiaphragm. Musculoskeletal: No chest wall abnormality. No acute or significant osseous findings. Review of the MIP images confirms the above findings. CT ABDOMEN and PELVIS FINDINGS Hepatobiliary: No focal liver abnormality is seen. No gallstones, gallbladder wall thickening, or biliary dilatation. Pancreas: Unremarkable. No pancreatic ductal dilatation or surrounding inflammatory changes. Spleen: Normal size spleen without mass. Punctate calcification consistent with a tiny granuloma is noted within. Adrenals/Urinary Tract: Normal bilateral adrenal glands. Symmetric cortical enhancement of kidneys without enhancing mass lesions. No nephrolithiasis nor hydroureteronephrosis. The urinary bladder is unremarkable for the degree of distention. Stomach/Bowel: Small hiatal hernia. Decompressed stomach with normal small bowel rotation. No bowel obstruction or inflammation. The terminal and distal ileum are normal. The appendix is not well visualized but no pericecal inflammatory change is seen. Moderate stool retention is noted within colon. Vascular/Lymphatic: Moderate aortoiliac atherosclerosis without aneurysm. No adenopathy. Reproductive: Hysterectomy.  No adnexal mass. Other: Tiny fat containing umbilical hernia. No free air nor free fluid. Musculoskeletal: Degenerative disc disease L3-4. No acute osseous abnormality. Review of the MIP images confirms the above findings. IMPRESSION: Chest CT: 1. Positive for acute PE to the right upper and lower lobes with CT evidence of right heart strain (RV/LV Ratio = 0.78) consistent with at least submassive (intermediate risk) PE. The presence of right heart strain has been  associated with an increased risk of morbidity and mortality. Please activate Code PE by paging [phone]. These results were called by telephone at the time of interpretation on 11/12/2017 at 11:43 pm to PA [facility] , who verbally acknowledged these results. 2. No active pulmonary disease. 3. Coronary arteriosclerosis along the LAD. 4. Mild aortic atherosclerosis without aneurysm or dissection. CT AP: 1. No acute solid nor hollow visceral organ pathology. 2. Degenerative disc disease L3-4. Electronically Signed   By: Tollie Eth M.D.   On: 11/12/2017 23:44    Procedures Procedures (including critical care time)  CRITICAL CARE Performed by: Bethel Born   Total critical care time: 35 minutes  Critical care time was exclusive of separately billable procedures and treating other patients.  Critical care was necessary to treat or prevent imminent or life-threatening deterioration.  Critical care was time spent personally by me on the following activities: development of treatment plan with patient and/or surrogate as well as nursing, discussions with consultants, evaluation of  patient's response to treatment, examination of patient, obtaining history from patient or surrogate, ordering and performing treatments and interventions, ordering and review of laboratory studies, ordering and review of radiographic studies, pulse oximetry and re-evaluation of patient's condition.   Medications Ordered in ED Medications  iopamidol (ISOVUE-370) 76 % injection 100 mL (has no administration in time range)  sodium chloride 0.9 % bolus 1,000 mL (0 mLs Intravenous Stopped 11/12/17 2208)  iopamidol (ISOVUE-300) 61 % injection 100 mL (100 mLs Intravenous Contrast Given 11/12/17 2300)    Initial Impression / Assessment and Plan / ED Course  I have reviewed the triage vital signs and the nursing notes.  Pertinent labs & imaging results that were available during my care of the patient were reviewed by  me and considered in my medical decision making (see chart for details).  62 year old female presents with acute right facial and arm cramping which prompted her ED visit tonight. She is markedly hypertensive, persistently tachycardic, and is hypoxic to 85% requiring 3L O2. She is very comfortable appearing on exam and actually denies any symptoms. CBC is remarkable for leukocytosis of 12.1. BMP is remarkable for hypernatremia (151), hyperglycemia (153). D-dimer is 17.24. Troponin is normal. CTA chest and CT abdomen/pelvis were ordered. CT is remarkable for acute bilateral PEs with right heart strain. Discussed with Dr. Marisue Ivan Deterding with critical care who recommends heparin and echo in the morning. They request hospitalist admission. Discussed with Dr. Mikeal Hawthorne who will admit.  Final Clinical Impressions(s) / ED Diagnoses   Final diagnoses:  Acute pulmonary embolism with acute cor pulmonale, unspecified pulmonary embolism type [facility])    ED Discharge Orders    None       Bethel Born, PA-C 11/13/17 1610    Loren Racer, MD 11/15/17 1531

## 2017-11-12 NOTE — ED Notes (Signed)
Patient transported to CT 

## 2017-11-13 ENCOUNTER — Encounter (HOSPITAL_COMMUNITY): Payer: Medicare Other

## 2017-11-13 DIAGNOSIS — I82402 Acute embolism and thrombosis of unspecified deep veins of left lower extremity: Secondary | ICD-10-CM | POA: Diagnosis not present

## 2017-11-13 DIAGNOSIS — N179 Acute kidney failure, unspecified: Secondary | ICD-10-CM | POA: Diagnosis not present

## 2017-11-13 DIAGNOSIS — E870 Hyperosmolality and hypernatremia: Secondary | ICD-10-CM

## 2017-11-13 DIAGNOSIS — Z7401 Bed confinement status: Secondary | ICD-10-CM | POA: Diagnosis not present

## 2017-11-13 DIAGNOSIS — R0902 Hypoxemia: Secondary | ICD-10-CM | POA: Diagnosis present

## 2017-11-13 DIAGNOSIS — F0390 Unspecified dementia without behavioral disturbance: Secondary | ICD-10-CM | POA: Diagnosis present

## 2017-11-13 DIAGNOSIS — Z7982 Long term (current) use of aspirin: Secondary | ICD-10-CM | POA: Diagnosis not present

## 2017-11-13 DIAGNOSIS — R609 Edema, unspecified: Secondary | ICD-10-CM | POA: Diagnosis not present

## 2017-11-13 DIAGNOSIS — G9341 Metabolic encephalopathy: Secondary | ICD-10-CM | POA: Diagnosis not present

## 2017-11-13 DIAGNOSIS — I82411 Acute embolism and thrombosis of right femoral vein: Secondary | ICD-10-CM | POA: Diagnosis present

## 2017-11-13 DIAGNOSIS — E119 Type 2 diabetes mellitus without complications: Secondary | ICD-10-CM | POA: Diagnosis present

## 2017-11-13 DIAGNOSIS — E1111 Type 2 diabetes mellitus with ketoacidosis with coma: Secondary | ICD-10-CM | POA: Diagnosis not present

## 2017-11-13 DIAGNOSIS — Z993 Dependence on wheelchair: Secondary | ICD-10-CM | POA: Diagnosis not present

## 2017-11-13 DIAGNOSIS — A419 Sepsis, unspecified organism: Secondary | ICD-10-CM | POA: Diagnosis not present

## 2017-11-13 DIAGNOSIS — R252 Cramp and spasm: Secondary | ICD-10-CM | POA: Diagnosis present

## 2017-11-13 DIAGNOSIS — E860 Dehydration: Secondary | ICD-10-CM | POA: Diagnosis present

## 2017-11-13 DIAGNOSIS — I5030 Unspecified diastolic (congestive) heart failure: Secondary | ICD-10-CM | POA: Diagnosis not present

## 2017-11-13 DIAGNOSIS — I2699 Other pulmonary embolism without acute cor pulmonale: Secondary | ICD-10-CM | POA: Diagnosis not present

## 2017-11-13 DIAGNOSIS — Z7984 Long term (current) use of oral hypoglycemic drugs: Secondary | ICD-10-CM | POA: Diagnosis not present

## 2017-11-13 DIAGNOSIS — G35 Multiple sclerosis: Secondary | ICD-10-CM

## 2017-11-13 DIAGNOSIS — D72829 Elevated white blood cell count, unspecified: Secondary | ICD-10-CM | POA: Diagnosis present

## 2017-11-13 DIAGNOSIS — E559 Vitamin D deficiency, unspecified: Secondary | ICD-10-CM | POA: Diagnosis present

## 2017-11-13 DIAGNOSIS — E785 Hyperlipidemia, unspecified: Secondary | ICD-10-CM | POA: Diagnosis present

## 2017-11-13 DIAGNOSIS — R509 Fever, unspecified: Secondary | ICD-10-CM | POA: Diagnosis not present

## 2017-11-13 DIAGNOSIS — Z79899 Other long term (current) drug therapy: Secondary | ICD-10-CM | POA: Diagnosis not present

## 2017-11-13 DIAGNOSIS — I2609 Other pulmonary embolism with acute cor pulmonale: Secondary | ICD-10-CM | POA: Diagnosis not present

## 2017-11-13 DIAGNOSIS — I2510 Atherosclerotic heart disease of native coronary artery without angina pectoris: Secondary | ICD-10-CM | POA: Diagnosis present

## 2017-11-13 DIAGNOSIS — Z86718 Personal history of other venous thrombosis and embolism: Secondary | ICD-10-CM | POA: Diagnosis not present

## 2017-11-13 DIAGNOSIS — R6510 Systemic inflammatory response syndrome (SIRS) of non-infectious origin without acute organ dysfunction: Secondary | ICD-10-CM | POA: Diagnosis not present

## 2017-11-13 DIAGNOSIS — I82431 Acute embolism and thrombosis of right popliteal vein: Secondary | ICD-10-CM | POA: Diagnosis present

## 2017-11-13 DIAGNOSIS — R000 Tachycardia, unspecified: Secondary | ICD-10-CM | POA: Diagnosis present

## 2017-11-13 DIAGNOSIS — I10 Essential (primary) hypertension: Secondary | ICD-10-CM | POA: Diagnosis present

## 2017-11-13 LAB — COMPREHENSIVE METABOLIC PANEL
ALBUMIN: 3.1 g/dL — AB (ref 3.5–5.0)
ALT: 14 U/L (ref 0–44)
AST: 13 U/L — ABNORMAL LOW (ref 15–41)
Alkaline Phosphatase: 70 U/L (ref 38–126)
Anion gap: 11 (ref 5–15)
BUN: 15 mg/dL (ref 8–23)
CHLORIDE: 113 mmol/L — AB (ref 98–111)
CO2: 25 mmol/L (ref 22–32)
Calcium: 9.1 mg/dL (ref 8.9–10.3)
Creatinine, Ser: 0.75 mg/dL (ref 0.44–1.00)
GFR calc Af Amer: >60 mL/min (ref >60)
GFR calc non Af Amer: >60 mL/min (ref >60)
GLUCOSE: 161 mg/dL — AB (ref 70–99)
POTASSIUM: 3.7 mmol/L (ref 3.5–5.1)
SODIUM: 149 mmol/L — AB (ref 135–145)
Total Bilirubin: 0.7 mg/dL (ref 0.3–1.2)
Total Protein: 7.1 g/dL (ref 6.5–8.1)

## 2017-11-13 LAB — GLUCOSE, CAPILLARY
GLUCOSE-CAPILLARY: 129 mg/dL — AB (ref 70–99)
Glucose-Capillary: 186 mg/dL — ABNORMAL HIGH (ref 70–99)

## 2017-11-13 LAB — MRSA PCR SCREENING: MRSA BY PCR: NEGATIVE

## 2017-11-13 LAB — CBG MONITORING, ED
Glucose-Capillary: 159 mg/dL — ABNORMAL HIGH (ref 70–99)
Glucose-Capillary: 99 mg/dL (ref 70–99)

## 2017-11-13 LAB — HEPARIN LEVEL (UNFRACTIONATED)
HEPARIN UNFRACTIONATED: 0.92 [IU]/mL — AB (ref 0.30–0.70)
Heparin Unfractionated: 1.68 IU/mL — ABNORMAL HIGH (ref 0.30–0.70)

## 2017-11-13 LAB — CBC
HEMATOCRIT: 41.2 % (ref 36.0–46.0)
Hemoglobin: 12.6 g/dL (ref 12.0–15.0)
MCH: 25.1 pg — ABNORMAL LOW (ref 26.0–34.0)
MCHC: 30.6 g/dL (ref 30.0–36.0)
MCV: 82.1 fL (ref 78.0–100.0)
Platelets: 236 10*3/uL (ref 150–400)
RBC: 5.02 MIL/uL (ref 3.87–5.11)
RDW: 14.6 % (ref 11.5–15.5)
WBC: 11.6 10*3/uL — ABNORMAL HIGH (ref 4.0–10.5)

## 2017-11-13 LAB — HIV ANTIBODY (ROUTINE TESTING W REFLEX): HIV SCREEN 4TH GENERATION: NONREACTIVE

## 2017-11-13 MED ORDER — PSYLLIUM 95 % PO PACK
1.0000 | PACK | Freq: Every day | ORAL | Status: DC
  Administered 2017-11-13 – 2017-11-15 (×3): 1 via ORAL
  Filled 2017-11-13 (×3): qty 1

## 2017-11-13 MED ORDER — ONDANSETRON HCL 4 MG/2ML IJ SOLN: 4.0000 mg | Freq: Four times a day (QID) | INTRAMUSCULAR | Status: DC | PRN

## 2017-11-13 MED ORDER — LABETALOL HCL 5 MG/ML IV SOLN
10.0000 mg | INTRAVENOUS | Status: DC | PRN
  Administered 2017-11-13 – 2017-11-14 (×2): 10 mg via INTRAVENOUS
  Filled 2017-11-13 (×2): qty 4

## 2017-11-13 MED ORDER — SODIUM CHLORIDE 0.45 % IV SOLN
INTRAVENOUS | Status: DC
  Administered 2017-11-13 – 2017-11-14 (×4): via INTRAVENOUS

## 2017-11-13 MED ORDER — HYDRALAZINE HCL 50 MG PO TABS
50.0000 mg | ORAL_TABLET | Freq: Three times a day (TID) | ORAL | Status: DC
  Administered 2017-11-13 – 2017-11-15 (×9): 50 mg via ORAL
  Filled 2017-11-13: qty 2
  Filled 2017-11-13 (×8): qty 1

## 2017-11-13 MED ORDER — POLYETHYLENE GLYCOL 3350 17 G PO PACK
17.0000 g | PACK | Freq: Every day | ORAL | Status: DC
  Administered 2017-11-13 – 2017-11-15 (×3): 17 g via ORAL
  Filled 2017-11-13 (×3): qty 1

## 2017-11-13 MED ORDER — CLONIDINE HCL 0.1 MG PO TABS
0.1000 mg | ORAL_TABLET | Freq: Every day | ORAL | Status: DC
  Administered 2017-11-13 – 2017-11-14 (×2): 0.1 mg via ORAL
  Filled 2017-11-13 (×2): qty 1

## 2017-11-13 MED ORDER — HEPARIN BOLUS VIA INFUSION
5000.0000 [IU] | Freq: Once | INTRAVENOUS | Status: AC
  Administered 2017-11-13: 5000 [IU] via INTRAVENOUS
  Filled 2017-11-13: qty 5000

## 2017-11-13 MED ORDER — DIPHENHYDRAMINE HCL 25 MG PO TABS: 25.0000 mg | ORAL_TABLET | Freq: Three times a day (TID) | ORAL | Status: DC | PRN

## 2017-11-13 MED ORDER — METOPROLOL SUCCINATE ER 25 MG PO TB24
25.0000 mg | ORAL_TABLET | Freq: Every day | ORAL | Status: DC
  Administered 2017-11-13: 25 mg via ORAL
  Filled 2017-11-13: qty 1

## 2017-11-13 MED ORDER — SODIUM CHLORIDE 0.9 % IV SOLN: INTRAVENOUS | Status: DC

## 2017-11-13 MED ORDER — INSULIN ASPART 100 UNIT/ML ~~LOC~~ SOLN
0.0000 [IU] | Freq: Three times a day (TID) | SUBCUTANEOUS | Status: DC
  Administered 2017-11-13: 2 [IU] via SUBCUTANEOUS
  Administered 2017-11-13: 1 [IU] via SUBCUTANEOUS
  Administered 2017-11-14 (×2): 2 [IU] via SUBCUTANEOUS
  Administered 2017-11-15: 1 [IU] via SUBCUTANEOUS
  Administered 2017-11-15: 2 [IU] via SUBCUTANEOUS
  Administered 2017-11-15: 1 [IU] via SUBCUTANEOUS
  Filled 2017-11-13: qty 1

## 2017-11-13 MED ORDER — MORPHINE SULFATE (PF) 4 MG/ML IV SOLN: 2.0000 mg | INTRAVENOUS | Status: DC | PRN

## 2017-11-13 MED ORDER — ATORVASTATIN CALCIUM 20 MG PO TABS
20.0000 mg | ORAL_TABLET | Freq: Every day | ORAL | Status: DC
  Administered 2017-11-13 – 2017-11-15 (×3): 20 mg via ORAL
  Filled 2017-11-13 (×3): qty 1

## 2017-11-13 MED ORDER — ONDANSETRON HCL 4 MG PO TABS: 4.0000 mg | ORAL_TABLET | Freq: Four times a day (QID) | ORAL | Status: DC | PRN

## 2017-11-13 MED ORDER — GABAPENTIN 300 MG PO CAPS
300.0000 mg | ORAL_CAPSULE | Freq: Two times a day (BID) | ORAL | Status: DC
  Administered 2017-11-13 – 2017-11-15 (×6): 300 mg via ORAL
  Filled 2017-11-13 (×6): qty 1

## 2017-11-13 MED ORDER — HEPARIN (PORCINE) IN NACL 100-0.45 UNIT/ML-% IJ SOLN
1350.0000 [IU]/h | INTRAMUSCULAR | Status: DC
  Administered 2017-11-13: 1350 [IU]/h via INTRAVENOUS
  Filled 2017-11-13: qty 250

## 2017-11-13 MED ORDER — CHOLECALCIFEROL 10 MCG (400 UNIT) PO TABS: 800.0000 [IU] | ORAL_TABLET | Freq: Every day | ORAL | Status: DC

## 2017-11-13 MED ORDER — SENNOSIDES-DOCUSATE SODIUM 8.6-50 MG PO TABS
2.0000 | ORAL_TABLET | Freq: Every day | ORAL | Status: DC
  Administered 2017-11-13 – 2017-11-15 (×3): 2 via ORAL
  Filled 2017-11-13 (×3): qty 2

## 2017-11-13 MED ORDER — FERROUS SULFATE 325 (65 FE) MG PO TABS: 325.0000 mg | ORAL_TABLET | Freq: Every day | ORAL | Status: DC

## 2017-11-13 MED ORDER — HEPARIN (PORCINE) IN NACL 100-0.45 UNIT/ML-% IJ SOLN
950.0000 [IU]/h | INTRAMUSCULAR | Status: DC
  Administered 2017-11-13 (×2): 1100 [IU]/h via INTRAVENOUS
  Administered 2017-11-14: 950 [IU]/h via INTRAVENOUS
  Filled 2017-11-13 (×2): qty 250

## 2017-11-13 MED ORDER — HEPARIN (PORCINE) IN NACL 100-0.45 UNIT/ML-% IJ SOLN
1100.0000 [IU]/h | INTRAMUSCULAR | Status: DC
  Filled 2017-11-13: qty 250

## 2017-11-13 MED ORDER — LISINOPRIL 20 MG PO TABS: 20.0000 mg | ORAL_TABLET | Freq: Every day | ORAL | Status: DC

## 2017-11-13 NOTE — ED Notes (Signed)
Pt CBG 159. Notified Thayer Ohm Banker). Pt given diet ginger ale.

## 2017-11-13 NOTE — ED Notes (Signed)
Carb modified lunch tray ordered 

## 2017-11-13 NOTE — ED Notes (Signed)
Pt CBG, 99. RN notified.

## 2017-11-13 NOTE — Progress Notes (Signed)
[name] [id number]  Code Status: FULL  Admission Data: 11/13/2017 8:06 PM  Attending Provider: Mariea Clonts  MCE:[id number], Sherilyn Cooter, MD  Consults/ Treatment Team: Treatment Team:  Pccm, Md, MD  [name] is a 61 y.o. female patient admitted from ED awake, alert - oriented X 3 - no acute distress noted. VSS - Blood pressure (!) 147/111, pulse (!) 106, temperature (!) 97.4 F (36.3 C), temperature source Axillary, resp. rate (!) 21, height 5\' 7"  (1.702 m), weight 75.9 kg (167 lb 5.3 oz), SpO2 98 %. no c/o shortness of breath, no c/o chest pain.  Orientation to room, and floor completed with information packet given to patient/family. SR up x 2, fall assessment complete, with patient and family able to verbalize understanding of risk associated with falls, and verbalized understanding to call nsg before up out of bed. Call light within reach, patient able to voice, and demonstrate understanding. Skin, clean-dry- intact without evidence of bruising, or skin tears.  No evidence of skin break down noted on exam.  ?  Will cont to eval and treat per MD orders.  Jon Gills, RN  11/13/2017 8:06 PM

## 2017-11-13 NOTE — ED Notes (Signed)
Breakfast tray given to pt 

## 2017-11-13 NOTE — Progress Notes (Signed)
ANTICOAGULATION CONSULT NOTE - Initial Consult  Pharmacy Consult for heparin Indication: pulmonary embolus  No Known Allergies  Patient Measurements: Height: 5\' 7"  (170.2 cm) Weight: 167 lb 5.3 oz (75.9 kg) IBW/kg (Calculated) : 61.6 Heparin Dosing Weight: 75.9 kg  Vital Signs: Temp: 99.6 F (37.6 C) (06/29 1910) Temp Source: Rectal (06/29 1910) BP: 184/123 (06/29 2330) Pulse Rate: 111 (06/29 2330)  Labs: Recent Labs    11/12/17 1825 11/12/17 2218  HGB 12.7  --   HCT 42.0  --   PLT 255  --   CREATININE 0.79  --   TROPONINI  --  <0.03    Estimated Creatinine Clearance: 77.5 mL/min (by C-G formula based on SCr of 0.79 mg/dL).   Medical History: Past Medical History:  Diagnosis Date  . Allergic rhinitis   . Blood dyscrasia   . Dementia   . Depression   . Diabetes mellitus   . Dysmetabolic syndrome X   . Family history of anesthesia complication    brother "  . Hypertension   . Hypokalemia   . Multiple sclerosis (HCC)   . Neuropathic pain   . Urinary incontinence   . Vitamin D deficiency     Medications:  See medication history  Assessment: 62 yo lady to start heparin for PE.  CT shows evidence of right heart strain.  She was not on anticoagulation PTA Goal of Therapy:  Heparin level 0.3-0.7 units/ml Monitor platelets by anticoagulation protocol: Yes   Plan:  Heparin 5000 unit bolus and drip at 1350 units/hr Check heparin level 6 hours after start and daily while on heparin Daily CBC Monitor for bleeding complications  Dustin Burrill Poteet 11/13/2017,12:19 AM

## 2017-11-13 NOTE — Progress Notes (Signed)
Patient seen and evaluated, chart reviewed, please see EMR for updated orders. Please see full H&P dictated by admitting physician Dr Mikeal Hawthorne for same date of service.    62 y.o. female with medical history significant of multiple sclerosis, diabetes, hypertension, hyperlipidemia who is literally bedbound that was sent over from the skilled nursing facility admitted on 11/13/17 with PE with concerns about right heart strain, echo and lower extremity Dopplers are pending  PCCM consult and input appreciated  Continue IV heparin   Patient seen and evaluated, chart reviewed, please see EMR for updated orders. Please see full H&P dictated by admitting physician Dr Mikeal Hawthorne for same date of service.

## 2017-11-13 NOTE — Progress Notes (Signed)
ANTICOAGULATION CONSULT NOTE  Pharmacy Consult:  Heparin Indication: pulmonary embolus  No Known Allergies  Patient Measurements: Height: 5\' 7"  (170.2 cm) Weight: 167 lb 5.3 oz (75.9 kg) IBW/kg (Calculated) : 61.6 Heparin Dosing Weight: 76 kg  Vital Signs: Temp: 98.7 F (37.1 C) (06/30 2006) Temp Source: Oral (06/30 2006) BP: 147/111 (06/30 1645) Pulse Rate: 106 (06/30 1645)  Labs: Recent Labs    11/12/17 1825 11/12/17 2218 11/13/17 0356 11/13/17 0842 11/13/17 2023  HGB 12.7  --  12.6  --   --   HCT 42.0  --  41.2  --   --   PLT 255  --  236  --   --   HEPARINUNFRC  --   --   --  1.68* 0.92*  CREATININE 0.79  --  0.75  --   --   TROPONINI  --  <0.03  --   --   --     Estimated Creatinine Clearance: 77.5 mL/min (by C-G formula based on SCr of 0.75 mg/dL).    Assessment: 51 YOF with acute PE/RHS to continue on IV heparin.  Heparin level is supra-therapeutic at 0.92.  No overt bleeding per RN.   Goal of Therapy:  Heparin level 0.3-0.7 units/ml Monitor platelets by anticoagulation protocol: Yes    Plan:  Decrease heparin to 950 units/hr Check 6 hr heparin level  Daily heparin level and CBC Monitor for s/sx of bleeding   Yanelle Sousa A. Jeanella Craze, PharmD, BCPS Clinical Pharmacist [place] Pager: [phone] Please utilize Amion for appropriate phone number to reach the unit pharmacist [facility])   11/13/2017, 9:19 PM

## 2017-11-13 NOTE — H&P (Signed)
History and Physical    [name]:[id number] DOB: 05/10/1956 DOA: 11/12/2017  Referring MD/NP/PA: Terance Hart, PA  PCP: Florentina Jenny, MD   Outpatient Specialists: None  Patient coming from: [name]  Chief Complaint: Cramping of the right face and arm  HPI: [name] is a 62 y.o. female with medical history significant of multiple sclerosis, diabetes, hypertension, hyperlipidemia who is literally bedbound that was sent over from the skilled nursing facility after nursing some right face and arm cramping.  Patient was noted to be hypoxic in the ER with some shortness of breath.  As part of work-up d-dimer was checked which was elevated with subsequent CT angiogram of the chest showing submassive pulmonary embolism on the right.  Patient is therefore being admitted with new onset PE.  The most likely risk factor being patient has been bedbound for the most part.  ED Course: Temperature is 99.6, blood pressure 184 x 123, pulse 111 respiratory 37 with oxygen sat 85% on room air.  Sodium is 151 potassium 3.7 chloride 114, glucose 153 and creatinine 0.79.  White count is 12.1 hemoglobin 12.7 and platelet 255.  D-dimer was 17.24.  CT angiogram of the chest showed Positive for acute PE to the right upper and lower lobes with CT evidence of right heart strain (RV/LV Ratio = 0.78) consistent with at least submassive (intermediate risk) PE  Review of Systems: As per HPI otherwise 10 point review of systems negative.    Past Medical History:  Diagnosis Date  . Allergic rhinitis   . Blood dyscrasia   . Dementia   . Depression   . Diabetes mellitus   . Dysmetabolic syndrome X   . Family history of anesthesia complication    brother "  . Hypertension   . Hypokalemia   . Multiple sclerosis (HCC)   . Neuropathic pain   . Urinary incontinence   . Vitamin D deficiency     Past Surgical History:  Procedure Laterality Date  . ABDOMINAL HYSTERECTOMY    .  CESAREAN SECTION       reports that she has never smoked. She has never used smokeless tobacco. She reports that she drinks alcohol. She reports that she does not use drugs.  No Known Allergies  Family History  Problem Relation Age of Onset  . Multiple sclerosis Brother      Prior to Admission medications   Medication Sig Start Date End Date Taking? Authorizing Provider  aspirin 81 MG chewable tablet Chew 81 mg by mouth daily.      [provider]  atorvastatin (LIPITOR) 20 MG tablet Take 20 mg by mouth at bedtime.    [provider]  cholecalciferol (VITAMIN D) 400 UNITS TABS tablet Take 800 Units by mouth daily.    [provider]  cloNIDine (CATAPRES) 0.1 MG tablet Take 0.1 mg by mouth daily.     [provider]  Cranberry 475 MG CAPS Take 475 mg by mouth daily.    [provider]  diphenhydrAMINE (BENADRYL) 25 MG tablet Take 1 tablet (25 mg total) by mouth every 8 (eight) hours as needed for itching. 01/21/17   Derwood Kaplan, MD  ferrous sulfate 325 (65 FE) MG tablet Take 325 mg by mouth daily with breakfast.      [provider]  gabapentin (NEURONTIN) 300 MG capsule Take 300 mg by mouth every 12 (twelve) hours.      [provider]  ibuprofen (ADVIL,MOTRIN)  800 MG tablet Take 800 mg by mouth every 8 (eight) hours as needed. For pain    [provider]  interferon beta-1a (AVONEX PEN) 30 MCG/0.5ML injection Inject 30 mcg into the muscle every 7 (seven) days.      [provider]  lisinopril (PRINIVIL,ZESTRIL) 20 MG tablet Take 20 mg by mouth daily.    [provider]  metFORMIN (GLUCOPHAGE) 500 MG tablet Take 500 mg by mouth 2 (two) times daily with a meal.      [provider]  metoprolol succinate (TOPROL-XL) 25 MG 24 hr tablet Take 25 mg by mouth daily.    [provider]  polyethylene glycol (MIRALAX / GLYCOLAX) packet Take 17 g by mouth daily. Mix with 8 oz water     [provider]  psyllium (REGULOID) 0.52 G capsule Take 0.52 g by mouth daily.    [provider]  senna-docusate (SENNA PLUS) 8.6-50 MG per tablet Take 2 tablets by mouth at bedtime.    [provider]    Physical Exam: Vitals:   11/12/17 2200 11/12/17 2230 11/12/17 2330 11/13/17 0011  BP: (!) 158/118 (!) 179/124 (!) 184/123   Pulse: (!) 108 (!) 108 (!) 111   Resp: (!) 21 18 (!) 37   Temp:      TempSrc:      SpO2: (!) 85% 95% 92%   Weight:    75.9 kg (167 lb 5.3 oz)  Height:    5\' 7"  (1.702 m)      Constitutional: NAD, calm, comfortable Vitals:   11/12/17 2200 11/12/17 2230 11/12/17 2330 11/13/17 0011  BP: (!) 158/118 (!) 179/124 (!) 184/123   Pulse: (!) 108 (!) 108 (!) 111   Resp: (!) 21 18 (!) 37   Temp:      TempSrc:      SpO2: (!) 85% 95% 92%   Weight:    75.9 kg (167 lb 5.3 oz)  Height:    5\' 7"  (1.702 m)   Chronically ill looking.  Slightly unkempt Eyes: PERRL, lids and conjunctivae normal ENMT: Mucous membranes are moist. Posterior pharynx clear of any exudate or lesions.Normal dentition.  Neck: normal, supple, no masses, no thyromegaly Respiratory: Decreased air entry bilaterally, no wheezing, no crackles. Normal respiratory effort. No accessory muscle use.  Cardiovascular: Regular rate and rhythm, no murmurs / rubs / gallops. 1+ extremity edema. 2+ pedal pulses. No carotid bruits.  Abdomen: no tenderness, no masses palpated. No hepatosplenomegaly. Bowel sounds positive.  Musculoskeletal: She has contractures, no clubbing / cyanosis. No joint deformity upper and lower extremities. Good ROM, no contractures. Normal muscle tone.  Skin: no rashes, lesions, ulcers. No induration Neurologic: Upper extremity weak,  CN 2-12 grossly intact. Sensation intact, DTR normal. Strength 5/5 in all 4.  Psychiatric: Normal judgment and insight. Alert and oriented x 3. Normal mood.   Labs on Admission: I have personally reviewed following labs and imaging  studies  CBC: Recent Labs  Lab 11/12/17 1825  WBC 12.1*  HGB 12.7  HCT 42.0  MCV 82.8  PLT 255   Basic Metabolic Panel: Recent Labs  Lab 11/12/17 1825  NA 151*  K 3.7  CL 114*  CO2 26  GLUCOSE 153*  BUN 19  CREATININE 0.79  CALCIUM 9.4   GFR: Estimated Creatinine Clearance: 77.5 mL/min (by C-G formula based on SCr of 0.79 mg/dL). Liver Function Tests: No results for input(s): AST, ALT, ALKPHOS, BILITOT, PROT, ALBUMIN in the last 168 hours. No results  for input(s): LIPASE, AMYLASE in the last 168 hours. No results for input(s): AMMONIA in the last 168 hours. Coagulation Profile: No results for input(s): INR, PROTIME in the last 168 hours. Cardiac Enzymes: Recent Labs  Lab 11/12/17 2218  TROPONINI <0.03   BNP (last 3 results) No results for input(s): PROBNP in the last 8760 hours. HbA1C: No results for input(s): HGBA1C in the last 72 hours. CBG: No results for input(s): GLUCAP in the last 168 hours. Lipid Profile: No results for input(s): CHOL, HDL, LDLCALC, TRIG, CHOLHDL, LDLDIRECT in the last 72 hours. Thyroid Function Tests: No results for input(s): TSH, T4TOTAL, FREET4, T3FREE, THYROIDAB in the last 72 hours. Anemia Panel: No results for input(s): VITAMINB12, FOLATE, FERRITIN, TIBC, IRON, RETICCTPCT in the last 72 hours. Urine analysis:    Component Value Date/Time   COLORURINE YELLOW 11/12/2017 2135   APPEARANCEUR CLEAR 11/12/2017 2135   LABSPEC 1.017 11/12/2017 2135   PHURINE 5.0 11/12/2017 2135   GLUCOSEU NEGATIVE 11/12/2017 2135   HGBUR NEGATIVE 11/12/2017 2135   BILIRUBINUR NEGATIVE 11/12/2017 2135   KETONESUR NEGATIVE 11/12/2017 2135   PROTEINUR NEGATIVE 11/12/2017 2135   UROBILINOGEN 1.0 12/28/2011 1634   NITRITE NEGATIVE 11/12/2017 2135   LEUKOCYTESUR TRACE (A) 11/12/2017 2135   Sepsis Labs: @LABRCNTIP (procalcitonin:4,lacticidven:4) )No results found for this or any previous visit (from the past 240 hour(s)).   Radiological Exams on  Admission: Dg Chest 2 View  Result Date: 11/12/2017 CLINICAL DATA:  Arm pain. EXAM: CHEST - 2 VIEW COMPARISON:  Chest x-ray dated December 28, 2011. FINDINGS: The heart size and mediastinal contours are within normal limits. Normal pulmonary vascularity. No focal consolidation, pleural effusion, or pneumothorax. Unchanged elevation of the left hemidiaphragm. No acute osseous abnormality. IMPRESSION: No active cardiopulmonary disease. Electronically Signed   By: Obie Dredge M.D.   On: 11/12/2017 19:56   Ct Angio Chest Pe W/cm &/or Wo Cm  Result Date: 11/12/2017 CLINICAL DATA:  Right cheek and arm cramping. Chest pain and tachycardia. EXAM: CT ANGIOGRAPHY CHEST CT ABDOMEN AND PELVIS WITH CONTRAST TECHNIQUE: Multidetector CT imaging of the chest was performed using the standard protocol during bolus administration of intravenous contrast. Multiplanar CT image reconstructions and MIPs were obtained to evaluate the vascular anatomy. Multidetector CT imaging of the abdomen and pelvis was performed using the standard protocol during bolus administration of intravenous contrast. CONTRAST:  ISOVUE-300 IOPAMIDOL (ISOVUE-300) INJECTION 61% COMPARISON:  CXR 11/12/2017 FINDINGS: CTA CHEST FINDINGS Cardiovascular: Acute pulmonary emboli at the branch point of the right main pulmonary embolus extending into the right lobar level in the upper lobe and segmental level to the right lower lobe. RV/LV ratio elevated at 0.79. Left ventricular hypertrophy is seen. No pericardial effusion. No aortic aneurysm or dissection. Mild ectasia of the ascending aorta to 3.7 cm. Common branch point of the right brachiocephalic and left common carotid arteries without stenosis of the great vessels. Coronary arteriosclerosis is noted along the LAD. Mediastinum/Nodes: No mediastinal nor hilar adenopathy. Trachea is unremarkable. Mainstem bronchi are patent. Lungs/Pleura: Dependent atelectasis. No acute pulmonary abnormality. No effusion  or pneumothorax. No dominant mass. Elevated left hemidiaphragm. Musculoskeletal: No chest wall abnormality. No acute or significant osseous findings. Review of the MIP images confirms the above findings. CT ABDOMEN and PELVIS FINDINGS Hepatobiliary: No focal liver abnormality is seen. No gallstones, gallbladder wall thickening, or biliary dilatation. Pancreas: Unremarkable. No pancreatic ductal dilatation or surrounding inflammatory changes. Spleen: Normal size spleen without mass. Punctate calcification consistent with a tiny granuloma is noted within.  Adrenals/Urinary Tract: Normal bilateral adrenal glands. Symmetric cortical enhancement of kidneys without enhancing mass lesions. No nephrolithiasis nor hydroureteronephrosis. The urinary bladder is unremarkable for the degree of distention. Stomach/Bowel: Small hiatal hernia. Decompressed stomach with normal small bowel rotation. No bowel obstruction or inflammation. The terminal and distal ileum are normal. The appendix is not well visualized but no pericecal inflammatory change is seen. Moderate stool retention is noted within colon. Vascular/Lymphatic: Moderate aortoiliac atherosclerosis without aneurysm. No adenopathy. Reproductive: Hysterectomy.  No adnexal mass. Other: Tiny fat containing umbilical hernia. No free air nor free fluid. Musculoskeletal: Degenerative disc disease L3-4. No acute osseous abnormality. Review of the MIP images confirms the above findings. IMPRESSION: Chest CT: 1. Positive for acute PE to the right upper and lower lobes with CT evidence of right heart strain (RV/LV Ratio = 0.78) consistent with at least submassive (intermediate risk) PE. The presence of right heart strain has been associated with an increased risk of morbidity and mortality. Please activate Code PE by paging [phone]. These results were called by telephone at the time of interpretation on 11/12/2017 at 11:43 pm to PA [facility] , who verbally acknowledged these  results. 2. No active pulmonary disease. 3. Coronary arteriosclerosis along the LAD. 4. Mild aortic atherosclerosis without aneurysm or dissection. CT AP: 1. No acute solid nor hollow visceral organ pathology. 2. Degenerative disc disease L3-4. Electronically Signed   By: Tollie Eth M.D.   On: 11/12/2017 23:44   Ct Abdomen Pelvis W Contrast  Result Date: 11/12/2017 CLINICAL DATA:  Right cheek and arm cramping. Chest pain and tachycardia. EXAM: CT ANGIOGRAPHY CHEST CT ABDOMEN AND PELVIS WITH CONTRAST TECHNIQUE: Multidetector CT imaging of the chest was performed using the standard protocol during bolus administration of intravenous contrast. Multiplanar CT image reconstructions and MIPs were obtained to evaluate the vascular anatomy. Multidetector CT imaging of the abdomen and pelvis was performed using the standard protocol during bolus administration of intravenous contrast. CONTRAST:  ISOVUE-300 IOPAMIDOL (ISOVUE-300) INJECTION 61% COMPARISON:  CXR 11/12/2017 FINDINGS: CTA CHEST FINDINGS Cardiovascular: Acute pulmonary emboli at the branch point of the right main pulmonary embolus extending into the right lobar level in the upper lobe and segmental level to the right lower lobe. RV/LV ratio elevated at 0.79. Left ventricular hypertrophy is seen. No pericardial effusion. No aortic aneurysm or dissection. Mild ectasia of the ascending aorta to 3.7 cm. Common branch point of the right brachiocephalic and left common carotid arteries without stenosis of the great vessels. Coronary arteriosclerosis is noted along the LAD. Mediastinum/Nodes: No mediastinal nor hilar adenopathy. Trachea is unremarkable. Mainstem bronchi are patent. Lungs/Pleura: Dependent atelectasis. No acute pulmonary abnormality. No effusion or pneumothorax. No dominant mass. Elevated left hemidiaphragm. Musculoskeletal: No chest wall abnormality. No acute or significant osseous findings. Review of the MIP images confirms the above  findings. CT ABDOMEN and PELVIS FINDINGS Hepatobiliary: No focal liver abnormality is seen. No gallstones, gallbladder wall thickening, or biliary dilatation. Pancreas: Unremarkable. No pancreatic ductal dilatation or surrounding inflammatory changes. Spleen: Normal size spleen without mass. Punctate calcification consistent with a tiny granuloma is noted within. Adrenals/Urinary Tract: Normal bilateral adrenal glands. Symmetric cortical enhancement of kidneys without enhancing mass lesions. No nephrolithiasis nor hydroureteronephrosis. The urinary bladder is unremarkable for the degree of distention. Stomach/Bowel: Small hiatal hernia. Decompressed stomach with normal small bowel rotation. No bowel obstruction or inflammation. The terminal and distal ileum are normal. The appendix is not well visualized but no pericecal inflammatory change is seen. Moderate stool retention  is noted within colon. Vascular/Lymphatic: Moderate aortoiliac atherosclerosis without aneurysm. No adenopathy. Reproductive: Hysterectomy.  No adnexal mass. Other: Tiny fat containing umbilical hernia. No free air nor free fluid. Musculoskeletal: Degenerative disc disease L3-4. No acute osseous abnormality. Review of the MIP images confirms the above findings. IMPRESSION: Chest CT: 1. Positive for acute PE to the right upper and lower lobes with CT evidence of right heart strain (RV/LV Ratio = 0.78) consistent with at least submassive (intermediate risk) PE. The presence of right heart strain has been associated with an increased risk of morbidity and mortality. Please activate Code PE by paging [phone]. These results were called by telephone at the time of interpretation on 11/12/2017 at 11:43 pm to PA [facility] , who verbally acknowledged these results. 2. No active pulmonary disease. 3. Coronary arteriosclerosis along the LAD. 4. Mild aortic atherosclerosis without aneurysm or dissection. CT AP: 1. No acute solid nor hollow visceral  organ pathology. 2. Degenerative disc disease L3-4. Electronically Signed   By: Tollie Eth M.D.   On: 11/12/2017 23:44    EKG: Independently reviewed.  EKG shows sinus tachycardia with a rate of 109, normal intervals, no significant T wave changes  Assessment/Plan Principal Problem:   Pulmonary embolism (HCC) Active Problems:   MS (multiple sclerosis) (HCC)   DM2 (diabetes mellitus, type 2) (HCC)   Pulmonary emboli (HCC)     #1 Submassive pulmonary embolism: Most likely secondary to sedentary lifestyle.  No history of cancer.  No prior PE or other risk factors.  Patient will be initiated on IV heparin drip.  Due to right heart strain we will check echocardiogram.  Pulmonary has been consulted.  Patient will gradually be transition to oral anticoagulation.  Bedrest for now.  #2 advanced multiple sclerosis: Patient is basically bedbound as indicated.  Continue her home regimen and monitoring.  Return back to skilled facility when ready  #3 diabetes: Sliding scale insulin will be initiated.  Accu-Cheks q. before meals and nightly.  #4 hypertension: Blood pressure is not controlled in the moment.  Resume all her home regimen and adjust accordingly  #5 hypernatremia: Most likely due to dehydration.  Hydrate and follow sodium levels.  #6 leukocytosis: Most likely secondary to pulmonary embolism.  Continue monitoring   DVT prophylaxis: Heparin  Code Status: Full code Family Communication: No one available  Disposition Plan: Back to SNF when ready  Consults called: PCCM  Admission status: inpatient Stepdown   Severity of Illness: The appropriate patient status for this patient is INPATIENT. Inpatient status is judged to be reasonable and necessary in order to provide the required intensity of service to ensure the patient's safety. The patient's presenting symptoms, physical exam findings, and initial radiographic and laboratory data in the context of their chronic comorbidities is felt  to place them at high risk for further clinical deterioration. Furthermore, it is not anticipated that the patient will be medically stable for discharge from the hospital within 2 midnights of admission. The following factors support the patient status of inpatient.   " The patient's presenting symptoms include right-sided cramping and pain. " The worrisome physical exam findings include chronic illness with bedbound status. " The initial radiographic and laboratory data are worrisome because of CT angiogram showing submassive pulmonary embolism. " The chronic co-morbidities include multiple sclerosis.   * I certify that at the point of admission it is my clinical judgment that the patient will require inpatient hospital care spanning beyond 2 midnights from the point of  admission due to high intensity of service, high risk for further deterioration and high frequency of surveillance required.Lonia Blood MD [facility] Pager [phone]  If 7PM-7AM, please contact night-coverage [email or web address] Password [email or web address]  11/13/2017, 12:29 AM

## 2017-11-13 NOTE — ED Notes (Signed)
CARB MOD ORDERED

## 2017-11-13 NOTE — ED Notes (Signed)
All heparin administrations checked with Margie Billet RN

## 2017-11-13 NOTE — Consult Note (Signed)
PULMONARY / CRITICAL CARE MEDICINE   Name: ROYA GIESELMAN MRN: [id number] DOB: 06-Dec-1955    ADMISSION DATE:  11/12/2017 CONSULTATION DATE: 11/13/17  REFERRING MD: Dr Mikeal Hawthorne  CHIEF COMPLAINT: PE  HISTORY OF PRESENT ILLNESS:   61yoM with hx Multiple sclerosis (wheelchair bound), HTN, DM, who presented to the ER for left arm and right cheek cramping that resolved spontaneously prior to presentation to the ER. In the ER she was found to be tachycardic (HR 115) and hypoxic (85% on RA). CXR was unrevealing. CTA Chest revealed RUL and RLL PE with RV strain. PCCM consulted. On my exam patient lying comfortable in bed, admits to some mild SOB, but no CP. She is speaking in full sentences, Pox 99% on 2.5L O2. She reports remote history of DVT or PE but is a poor historian and can't describe exactly where the clot was or when. She remembers taking anticoag at that time but is no longer on it. She says she wears O2 as needed as an outpatient but cant tell me how many liters she uses or why she has chronic hypoxia. She says she feels "tired" and is "ready to go home."  PAST MEDICAL HISTORY :  She  has a past medical history of Allergic rhinitis, Blood dyscrasia, Dementia, Depression, Diabetes mellitus, Dysmetabolic syndrome X, Family history of anesthesia complication, Hypertension, Hypokalemia, Multiple sclerosis (HCC), Neuropathic pain, Urinary incontinence, and Vitamin D deficiency.  PAST SURGICAL HISTORY: She  has a past surgical history that includes Cesarean section and Abdominal hysterectomy.  No Known Allergies  No current facility-administered medications on file prior to encounter.    Current Outpatient Medications on File Prior to Encounter  Medication Sig  . aspirin 81 MG chewable tablet Chew 81 mg by mouth daily.    Marland Kitchen atorvastatin (LIPITOR) 20 MG tablet Take 20 mg by mouth at bedtime.  . cholecalciferol (VITAMIN D) 400 UNITS TABS tablet Take 800 Units by mouth daily.  . cloNIDine  (CATAPRES) 0.1 MG tablet Take 0.1 mg by mouth daily.   . Cranberry 475 MG CAPS Take 475 mg by mouth daily.  . diphenhydrAMINE (BENADRYL) 25 MG tablet Take 1 tablet (25 mg total) by mouth every 8 (eight) hours as needed for itching.  . ferrous sulfate 325 (65 FE) MG tablet Take 325 mg by mouth daily with breakfast.    . gabapentin (NEURONTIN) 300 MG capsule Take 300 mg by mouth every 12 (twelve) hours.    Marland Kitchen ibuprofen (ADVIL,MOTRIN) 800 MG tablet Take 800 mg by mouth every 8 (eight) hours as needed. For pain  . interferon beta-1a (AVONEX PEN) 30 MCG/0.5ML injection Inject 30 mcg into the muscle every 7 (seven) days.    Marland Kitchen lisinopril (PRINIVIL,ZESTRIL) 20 MG tablet Take 20 mg by mouth daily.  . metFORMIN (GLUCOPHAGE) 500 MG tablet Take 500 mg by mouth 2 (two) times daily with a meal.    . metoprolol succinate (TOPROL-XL) 25 MG 24 hr tablet Take 25 mg by mouth daily.  . polyethylene glycol (MIRALAX / GLYCOLAX) packet Take 17 g by mouth daily. Mix with 8 oz water  . psyllium (REGULOID) 0.52 G capsule Take 0.52 g by mouth daily.  Marland Kitchen senna-docusate (SENNA PLUS) 8.6-50 MG per tablet Take 2 tablets by mouth at bedtime.    FAMILY HISTORY:  Her indicated that the status of her brother is unknown.   SOCIAL HISTORY: She  reports that she has never smoked. She has never used smokeless tobacco. She reports that she drinks alcohol.  She reports that she does not use drugs.  REVIEW OF SYSTEMS:   Review of Systems  Constitutional: Negative.   HENT: Negative.   Eyes: Negative.   Respiratory: Positive for shortness of breath.   Cardiovascular: Negative.   Gastrointestinal: Negative.   Genitourinary: Negative.   Musculoskeletal: Negative.   Skin: Negative.   Neurological: Negative.   Endo/Heme/Allergies: Negative.   Psychiatric/Behavioral: Negative.    SUBJECTIVE:  Lying on ER stretcher in NAD  VITAL SIGNS: BP (!) 184/123   Pulse (!) 111   Temp (S) 99.6 F (37.6 C) (Rectal)   Resp (!) 37   Ht  5\' 7"  (1.702 m)   Wt 75.9 kg (167 lb 5.3 oz)   SpO2 92%   BMI 26.21 kg/m   INTAKE / OUTPUT: No intake/output data recorded.  PHYSICAL EXAMINATION: General: WDWN Adult female, lying on ER stretcher in NAD Neuro: AAOx3, moving all extremities, obeying commands, answering questions HEENT: PERRL, OP clear, MM moist  Cardiovascular: Tachycardic to 107 with a regular rhythm, no m/r/g Lungs: CTA b/l, no respiratory distress, no accessory muscle use, speaking in full sentences Abdomen: soft ntnd Musculoskeletal: no LE edema; muscle atrophy in BLE Skin: no rashes   LABS:  BMET Recent Labs  Lab 11/12/17 1825  NA 151*  K 3.7  CL 114*  CO2 26  BUN 19  CREATININE 0.79  GLUCOSE 153*   Electrolytes Recent Labs  Lab 11/12/17 1825  CALCIUM 9.4   CBC Recent Labs  Lab 11/12/17 1825  WBC 12.1*  HGB 12.7  HCT 42.0  PLT 255   Coag's No results for input(s): APTT, INR in the last 168 hours.  Sepsis Markers No results for input(s): LATICACIDVEN, PROCALCITON, O2SATVEN in the last 168 hours.  ABG No results for input(s): PHART, PCO2ART, PO2ART in the last 168 hours.  Liver Enzymes No results for input(s): AST, ALT, ALKPHOS, BILITOT, ALBUMIN in the last 168 hours.  Cardiac Enzymes Recent Labs  Lab 11/12/17 2218  TROPONINI <0.03   Glucose No results for input(s): GLUCAP in the last 168 hours.  Imaging Dg Chest 2 View  Result Date: 11/12/2017 CLINICAL DATA:  Arm pain. EXAM: CHEST - 2 VIEW COMPARISON:  Chest x-ray dated December 28, 2011. FINDINGS: The heart size and mediastinal contours are within normal limits. Normal pulmonary vascularity. No focal consolidation, pleural effusion, or pneumothorax. Unchanged elevation of the left hemidiaphragm. No acute osseous abnormality. IMPRESSION: No active cardiopulmonary disease. Electronically Signed   By: Obie Dredge M.D.   On: 11/12/2017 19:56   Ct Angio Chest Pe W/cm &/or Wo Cm  Result Date: 11/12/2017 CLINICAL DATA:   Right cheek and arm cramping. Chest pain and tachycardia. EXAM: CT ANGIOGRAPHY CHEST CT ABDOMEN AND PELVIS WITH CONTRAST TECHNIQUE: Multidetector CT imaging of the chest was performed using the standard protocol during bolus administration of intravenous contrast. Multiplanar CT image reconstructions and MIPs were obtained to evaluate the vascular anatomy. Multidetector CT imaging of the abdomen and pelvis was performed using the standard protocol during bolus administration of intravenous contrast. CONTRAST:  ISOVUE-300 IOPAMIDOL (ISOVUE-300) INJECTION 61% COMPARISON:  CXR 11/12/2017 FINDINGS: CTA CHEST FINDINGS Cardiovascular: Acute pulmonary emboli at the branch point of the right main pulmonary embolus extending into the right lobar level in the upper lobe and segmental level to the right lower lobe. RV/LV ratio elevated at 0.79. Left ventricular hypertrophy is seen. No pericardial effusion. No aortic aneurysm or dissection. Mild ectasia of the ascending aorta to 3.7 cm. Common branch  point of the right brachiocephalic and left common carotid arteries without stenosis of the great vessels. Coronary arteriosclerosis is noted along the LAD. Mediastinum/Nodes: No mediastinal nor hilar adenopathy. Trachea is unremarkable. Mainstem bronchi are patent. Lungs/Pleura: Dependent atelectasis. No acute pulmonary abnormality. No effusion or pneumothorax. No dominant mass. Elevated left hemidiaphragm. Musculoskeletal: No chest wall abnormality. No acute or significant osseous findings. Review of the MIP images confirms the above findings. CT ABDOMEN and PELVIS FINDINGS Hepatobiliary: No focal liver abnormality is seen. No gallstones, gallbladder wall thickening, or biliary dilatation. Pancreas: Unremarkable. No pancreatic ductal dilatation or surrounding inflammatory changes. Spleen: Normal size spleen without mass. Punctate calcification consistent with a tiny granuloma is noted within. Adrenals/Urinary Tract: Normal  bilateral adrenal glands. Symmetric cortical enhancement of kidneys without enhancing mass lesions. No nephrolithiasis nor hydroureteronephrosis. The urinary bladder is unremarkable for the degree of distention. Stomach/Bowel: Small hiatal hernia. Decompressed stomach with normal small bowel rotation. No bowel obstruction or inflammation. The terminal and distal ileum are normal. The appendix is not well visualized but no pericecal inflammatory change is seen. Moderate stool retention is noted within colon. Vascular/Lymphatic: Moderate aortoiliac atherosclerosis without aneurysm. No adenopathy. Reproductive: Hysterectomy.  No adnexal mass. Other: Tiny fat containing umbilical hernia. No free air nor free fluid. Musculoskeletal: Degenerative disc disease L3-4. No acute osseous abnormality. Review of the MIP images confirms the above findings. IMPRESSION: Chest CT: 1. Positive for acute PE to the right upper and lower lobes with CT evidence of right heart strain (RV/LV Ratio = 0.78) consistent with at least submassive (intermediate risk) PE. The presence of right heart strain has been associated with an increased risk of morbidity and mortality. Please activate Code PE by paging [phone]. These results were called by telephone at the time of interpretation on 11/12/2017 at 11:43 pm to PA [facility] , who verbally acknowledged these results. 2. No active pulmonary disease. 3. Coronary arteriosclerosis along the LAD. 4. Mild aortic atherosclerosis without aneurysm or dissection. CT AP: 1. No acute solid nor hollow visceral organ pathology. 2. Degenerative disc disease L3-4. Electronically Signed   By: Tollie Eth M.D.   On: 11/12/2017 23:44   Ct Abdomen Pelvis W Contrast  Result Date: 11/12/2017 CLINICAL DATA:  Right cheek and arm cramping. Chest pain and tachycardia. EXAM: CT ANGIOGRAPHY CHEST CT ABDOMEN AND PELVIS WITH CONTRAST TECHNIQUE: Multidetector CT imaging of the chest was performed using the standard  protocol during bolus administration of intravenous contrast. Multiplanar CT image reconstructions and MIPs were obtained to evaluate the vascular anatomy. Multidetector CT imaging of the abdomen and pelvis was performed using the standard protocol during bolus administration of intravenous contrast. CONTRAST:  ISOVUE-300 IOPAMIDOL (ISOVUE-300) INJECTION 61% COMPARISON:  CXR 11/12/2017 FINDINGS: CTA CHEST FINDINGS Cardiovascular: Acute pulmonary emboli at the branch point of the right main pulmonary embolus extending into the right lobar level in the upper lobe and segmental level to the right lower lobe. RV/LV ratio elevated at 0.79. Left ventricular hypertrophy is seen. No pericardial effusion. No aortic aneurysm or dissection. Mild ectasia of the ascending aorta to 3.7 cm. Common branch point of the right brachiocephalic and left common carotid arteries without stenosis of the great vessels. Coronary arteriosclerosis is noted along the LAD. Mediastinum/Nodes: No mediastinal nor hilar adenopathy. Trachea is unremarkable. Mainstem bronchi are patent. Lungs/Pleura: Dependent atelectasis. No acute pulmonary abnormality. No effusion or pneumothorax. No dominant mass. Elevated left hemidiaphragm. Musculoskeletal: No chest wall abnormality. No acute or significant osseous findings. Review of the MIP  images confirms the above findings. CT ABDOMEN and PELVIS FINDINGS Hepatobiliary: No focal liver abnormality is seen. No gallstones, gallbladder wall thickening, or biliary dilatation. Pancreas: Unremarkable. No pancreatic ductal dilatation or surrounding inflammatory changes. Spleen: Normal size spleen without mass. Punctate calcification consistent with a tiny granuloma is noted within. Adrenals/Urinary Tract: Normal bilateral adrenal glands. Symmetric cortical enhancement of kidneys without enhancing mass lesions. No nephrolithiasis nor hydroureteronephrosis. The urinary bladder is unremarkable for the degree of  distention. Stomach/Bowel: Small hiatal hernia. Decompressed stomach with normal small bowel rotation. No bowel obstruction or inflammation. The terminal and distal ileum are normal. The appendix is not well visualized but no pericecal inflammatory change is seen. Moderate stool retention is noted within colon. Vascular/Lymphatic: Moderate aortoiliac atherosclerosis without aneurysm. No adenopathy. Reproductive: Hysterectomy.  No adnexal mass. Other: Tiny fat containing umbilical hernia. No free air nor free fluid. Musculoskeletal: Degenerative disc disease L3-4. No acute osseous abnormality. Review of the MIP images confirms the above findings. IMPRESSION: Chest CT: 1. Positive for acute PE to the right upper and lower lobes with CT evidence of right heart strain (RV/LV Ratio = 0.78) consistent with at least submassive (intermediate risk) PE. The presence of right heart strain has been associated with an increased risk of morbidity and mortality. Please activate Code PE by paging [phone]. These results were called by telephone at the time of interpretation on 11/12/2017 at 11:43 pm to PA [facility] , who verbally acknowledged these results. 2. No active pulmonary disease. 3. Coronary arteriosclerosis along the LAD. 4. Mild aortic atherosclerosis without aneurysm or dissection. CT AP: 1. No acute solid nor hollow visceral organ pathology. 2. Degenerative disc disease L3-4. Electronically Signed   By: Tollie Eth M.D.   On: 11/12/2017 23:44   DISCUSSION: 61yoM with hx Multiple sclerosis (wheelchair bound), HTN, DM, who presented to the ER for left arm and right cheek cramping that resolved spontaneously prior to presentation to the ER. In the ER she was found to be tachycardic (HR 115) and hypoxic (85% on RA). CXR was unrevealing. CTA Chest revealed RUL and RLL PE with RV strain.  ASSESSMENT / PLAN: 1. Submassive PE: - patient with remote history of DVT/PE, now presents with new submassive PE. Minimal  symptoms of SOB, requiring only 2.5L O2, hemodynamically stable.  - CTA Chest on my review shows thrombus within the RUL and RLL; RV/LV 0.78 consistent with RV strain. However troponin is normal.  - Obtain TTE and BLE dopplers - Not a candidate for ekos - Begin heparin infusion; recommend admission to SDU and defer to primary team to transition to oral anticoagulant  2. Hypernatremia: - Na 151; start 1/2NS @ 50cc/hr   45 minutes critical care time  Milana Obey, MD Pulmonary and Critical Care Medicine [facility] Pager: [phone]  11/13/2017, 12:50 AM

## 2017-11-13 NOTE — Progress Notes (Addendum)
ANTICOAGULATION CONSULT NOTE  Pharmacy Consult:  Heparin Indication: pulmonary embolus  No Known Allergies  Patient Measurements: Height: 5\' 7"  (170.2 cm) Weight: 167 lb 5.3 oz (75.9 kg) IBW/kg (Calculated) : 61.6 Heparin Dosing Weight: 76 kg  Vital Signs: BP: 139/97 (06/30 1030) Pulse Rate: 108 (06/30 1030)  Labs: Recent Labs    11/12/17 1825 11/12/17 2218 11/13/17 0356 11/13/17 0842  HGB 12.7  --  12.6  --   HCT 42.0  --  41.2  --   PLT 255  --  236  --   HEPARINUNFRC  --   --   --  1.68*  CREATININE 0.79  --  0.75  --   TROPONINI  --  <0.03  --   --     Estimated Creatinine Clearance: 77.5 mL/min (by C-G formula based on SCr of 0.75 mg/dL).    Assessment: 86 YOF with acute PE/RHS to continue on IV heparin.  Heparin level is supra-therapeutic.  RN saw bandaid on right arm and IV sites are on patient's left arm.  No overt bleeding per RN.   Goal of Therapy:  Heparin level 0.3-0.7 units/ml Monitor platelets by anticoagulation protocol: Yes    Plan:  Hold heparin for ~1.5 hours (done at 1105), then at 1230 resume at 1100 units/hr Check 6 hr heparin level after gtt resumed Daily heparin level and CBC Monitor for s/sx of bleeding   Henna Derderian D. Laney Potash, PharmD, BCPS, BCCCP 11/13/2017, 11:09 AM

## 2017-11-14 ENCOUNTER — Inpatient Hospital Stay (HOSPITAL_COMMUNITY): Payer: BLUE CROSS/BLUE SHIELD

## 2017-11-14 DIAGNOSIS — I82409 Acute embolism and thrombosis of unspecified deep veins of unspecified lower extremity: Secondary | ICD-10-CM

## 2017-11-14 DIAGNOSIS — R609 Edema, unspecified: Secondary | ICD-10-CM

## 2017-11-14 DIAGNOSIS — I5030 Unspecified diastolic (congestive) heart failure: Secondary | ICD-10-CM

## 2017-11-14 LAB — CBC
HEMATOCRIT: 39.8 % (ref 36.0–46.0)
Hemoglobin: 12.4 g/dL (ref 12.0–15.0)
MCH: 25.3 pg — ABNORMAL LOW (ref 26.0–34.0)
MCHC: 31.2 g/dL (ref 30.0–36.0)
MCV: 81.2 fL (ref 78.0–100.0)
PLATELETS: 234 10*3/uL (ref 150–400)
RBC: 4.9 MIL/uL (ref 3.87–5.11)
RDW: 14.6 % (ref 11.5–15.5)
WBC: 13.2 10*3/uL — AB (ref 4.0–10.5)

## 2017-11-14 LAB — ECHOCARDIOGRAM COMPLETE
Height: 67 in
WEIGHTICAEL: 2677.27 [oz_av]

## 2017-11-14 LAB — BASIC METABOLIC PANEL
ANION GAP: 11 (ref 5–15)
BUN: 13 mg/dL (ref 8–23)
CALCIUM: 8.4 mg/dL — AB (ref 8.9–10.3)
CO2: 20 mmol/L — AB (ref 22–32)
Chloride: 108 mmol/L (ref 98–111)
Creatinine, Ser: 0.64 mg/dL (ref 0.44–1.00)
GLUCOSE: 125 mg/dL — AB (ref 70–99)
POTASSIUM: 3.3 mmol/L — AB (ref 3.5–5.1)
Sodium: 139 mmol/L (ref 135–145)

## 2017-11-14 LAB — GLUCOSE, CAPILLARY
GLUCOSE-CAPILLARY: 111 mg/dL — AB (ref 70–99)
Glucose-Capillary: 168 mg/dL — ABNORMAL HIGH (ref 70–99)
Glucose-Capillary: 159 mg/dL — ABNORMAL HIGH (ref 70–99)
Glucose-Capillary: 155 mg/dL — ABNORMAL HIGH (ref 70–99)
Glucose-Capillary: 154 mg/dL — ABNORMAL HIGH (ref 70–99)

## 2017-11-14 LAB — HEPARIN LEVEL (UNFRACTIONATED): HEPARIN UNFRACTIONATED: 0.61 [IU]/mL (ref 0.30–0.70)

## 2017-11-14 MED ORDER — METOPROLOL SUCCINATE ER 50 MG PO TB24
50.0000 mg | ORAL_TABLET | Freq: Every day | ORAL | Status: DC
  Administered 2017-11-14 – 2017-11-15 (×2): 50 mg via ORAL
  Filled 2017-11-14 (×2): qty 1

## 2017-11-14 MED ORDER — CLONIDINE HCL 0.1 MG PO TABS
0.1000 mg | ORAL_TABLET | Freq: Two times a day (BID) | ORAL | Status: DC
  Administered 2017-11-14 – 2017-11-15 (×3): 0.1 mg via ORAL
  Filled 2017-11-14 (×3): qty 1

## 2017-11-14 MED ORDER — POTASSIUM CHLORIDE CRYS ER 20 MEQ PO TBCR
40.0000 meq | EXTENDED_RELEASE_TABLET | Freq: Once | ORAL | Status: AC
  Administered 2017-11-14: 40 meq via ORAL
  Filled 2017-11-14: qty 2

## 2017-11-14 MED ORDER — PERFLUTREN LIPID MICROSPHERE
1.0000 mL | INTRAVENOUS | Status: AC | PRN
  Administered 2017-11-14: 2 mL via INTRAVENOUS
  Filled 2017-11-14: qty 10

## 2017-11-14 NOTE — Evaluation (Signed)
Physical Therapy Evaluation Patient Details Name: [name] MRN: [id number] DOB: 01/17/1956 Today's Date: 11/14/2017   History of Present Illness  Patient is a 62 y/o female presenting with cramping of R face and arm. Noted to be hypoxic in the ER with SOB. As part of work-up d-dimer was checked which was elevated with subsequent CT angiogram of the chest showing submassive pulmonary embolism on the right.  Patient is therefore being admitted with new onset PE.  Of note, doppler today revealing Right - positive for acute DVT of the popliteal femoral and distal common femoral veins. Patient with a PMH significant for multiple sclerosis, diabetes, hypertension, hyperlipidemia.  Clinical Impression  [name] admitted with the above listed diagnosis. Per chart review, is a resident of [facility] prior to admission, and was wheelchair vs bedbound. Patient today only tolerable to rolling in bed for peri care requiring Max A for mobility and for repositioning in bed. Noted little intentional movement at B LE, and prefers B hip external rotation and knees flexed - likely some component of flexion contracture as PT unable to achieve full passive knee extension. Recommending return to SNF when medically able.     Follow Up Recommendations SNF;Supervision/Assistance - 24 hour    Equipment Recommendations  None recommended by PT    Recommendations for Other Services       Precautions / Restrictions Precautions Precautions: Fall Restrictions Weight Bearing Restrictions: No      Mobility  Bed Mobility Overal bed mobility: Needs Assistance Bed Mobility: Rolling Rolling: Max assist;+2 for physical assistance         General bed mobility comments: patient requesting to not sit EOB or progress bed mobility at this time due to fatigue  Transfers                    Ambulation/Gait                Stairs            Wheelchair Mobility    Modified Rankin  (Stroke Patients Only)       Balance                                             Pertinent Vitals/Pain Pain Assessment: No/denies pain    Home Living Family/patient expects to be discharged to:: Skilled nursing facility                 Additional Comments: Writer    Prior Function Level of Independence: Needs assistance   Gait / Transfers Assistance Needed: per chart - patient utilizing w/c           Hand Dominance        Extremity/Trunk Assessment        Lower Extremity Assessment Lower Extremity Assessment: Generalized weakness;RLE deficits/detail;LLE deficits/detail RLE Deficits / Details: LE externally rotated and flexed, PT attempting to fully extend LE, but unable LLE Deficits / Details: LE externally rotated and flexed, PT attempting to fully extend LE, but unable       Communication   Communication: No difficulties  Cognition Arousal/Alertness: Awake/alert Behavior During Therapy: WFL for tasks assessed/performed Overall Cognitive Status: No family/caregiver present to determine baseline cognitive functioning  General Comments: patient oriented to herself only      General Comments      Exercises     Assessment/Plan    PT Assessment Patient needs continued PT services  PT Problem List Decreased strength;Decreased activity tolerance;Decreased range of motion;Decreased balance;Decreased mobility;Decreased coordination;Decreased cognition;Decreased knowledge of use of DME;Decreased safety awareness       PT Treatment Interventions DME instruction;Functional mobility training;Gait training;Therapeutic activities;Therapeutic exercise;Balance training;Neuromuscular re-education;Cognitive remediation;Patient/family education    PT Goals (Current goals can be found in the Care Plan section)  Acute Rehab PT Goals Patient Stated Goal: none stated PT Goal Formulation: With  patient Time For Goal Achievement: 11/28/17 Potential to Achieve Goals: Fair    Frequency Min 2X/week   Barriers to discharge        Co-evaluation               AM-PAC PT "6 Clicks" Daily Activity  Outcome Measure Difficulty turning over in bed (including adjusting bedclothes, sheets and blankets)?: Unable Difficulty moving from lying on back to sitting on the side of the bed? : Unable Difficulty sitting down on and standing up from a chair with arms (e.g., wheelchair, bedside commode, etc,.)?: Unable Help needed moving to and from a bed to chair (including a wheelchair)?: Total Help needed walking in hospital room?: Total Help needed climbing 3-5 steps with a railing? : Total 6 Click Score: 6    End of Session   Activity Tolerance: Patient tolerated treatment well Patient left: in bed;with call bell/phone within reach;with nursing/sitter in room Nurse Communication: Mobility status PT Visit Diagnosis: Other abnormalities of gait and mobility (R26.89);Muscle weakness (generalized) (M62.81)    Time: 4098-1191 PT Time Calculation (min) (ACUTE ONLY): 14 min   Charges:   PT Evaluation $PT Eval Moderate Complexity: 1 Mod     PT G Codes:       Kipp Laurence, PT, DPT 11/14/17 3:22 PM

## 2017-11-14 NOTE — Progress Notes (Signed)
Bilateral lower extremity venous duplex completed. Right - positive for acute DVT of the popliteal femoral and distal common femoral veins. Left - There is no evidence of a DVT. Bilateral - There is no evidence of a Baker's cyst. [name],RVS 7.1.2019 10:10 AM

## 2017-11-14 NOTE — Progress Notes (Signed)
  Echocardiogram 2D Echocardiogram has been performed.  [name] 11/14/2017, 11:02 AM

## 2017-11-14 NOTE — Progress Notes (Signed)
Patient Demographics:    [name], is a 62 y.o. female, DOB - 02-Feb-1956, ZOX:[id number]  Admit date - 11/12/2017   Admitting Physician Rometta Emery, MD  Outpatient Primary MD for the patient is Florentina Jenny, MD  LOS - 1   Chief Complaint  Patient presents with  . Multiple Sclerosis        Subjective:    [name] today has no fevers, no emesis,  No chest pain, shortness of breath is better  Assessment  & Plan :    Principal Problem:   Pulmonary embolism (HCC) Active Problems:   DM2 (diabetes mellitus, type 2) (HCC)   Secondary progressive multiple sclerosis (HCC)   Pulmonary emboli (HCC)   DVT (deep venous thrombosis) Rt Leg  Brief summary:- 62 y.o.femalewith medical history significant ofmultiple sclerosis, diabetes, hypertension, hyperlipidemia who is literally bedbound that was sent over from the skilled nursing facility admitted on 11/13/17 with acute PE    Plan:- 1)PE and Right LE Acute DVT of the Popliteal Femoral and Distal Common Femoral Veins--- shortness of breath is better, continue IV heparin, plan is to transition patient to p.o. Eliquis prior to discharge if no bleeding Echo with preserved EF of 65 to 70% with grade 1 diastolic dysfunction,   2)DM 2-no recent A1c available, metformin is on hold due to contrast study, may restart metformin upon discharge A1c was 6.1 in 2013, Use Novolog/Humalog Sliding scale insulin with Accu-Cheks/Fingersticks as ordered   3)HTN--BP not at goal, change clonidine to 0.1 mg twice  times daily, continue Toprol-XL 50 mg daily, c/n hydralazine 50 mg 3 times daily and clonidine 0.1 mg   4)MS--with neuromuscular deficits, patient does interferon beta Injections 30 mcg every 7 days, supportive care  5)Dispo--anticipate discharge back to skilled nursing facility on 11/15/2017, get PT eval   Code Status : Full  Disposition Plan  :  SNF  Consults  :  PCCM    DVT Prophylaxis  :  Iv Heparin -   Lab Results  Component Value Date   PLT 234 11/14/2017    Inpatient Medications  Scheduled Meds: . atorvastatin  20 mg Oral QHS  . cloNIDine  0.1 mg Oral Daily  . gabapentin  300 mg Oral Q12H  . hydrALAZINE  50 mg Oral TID  . insulin aspart  0-9 Units Subcutaneous TID WC  . metoprolol succinate  50 mg Oral Daily  . polyethylene glycol  17 g Oral Daily  . psyllium  1 packet Oral Daily  . senna-docusate  2 tablet Oral QHS   Continuous Infusions: . sodium chloride 50 mL/hr at 11/14/17 1446  . heparin 950 Units/hr (11/13/17 2122)   PRN Meds:.iopamidol, labetalol, morphine injection, ondansetron **OR** ondansetron (ZOFRAN) IV    Anti-infectives (From admission, onward)   None        Objective:   Vitals:   11/14/17 0445 11/14/17 0805 11/14/17 0922 11/14/17 1159  BP:  (!) 190/105  (!) 147/89  Pulse:   (!) 108 (!) 114  Resp:  (!) 25  14  Temp: 99.9 F (37.7 C) 98.5 F (36.9 C)  99 F (37.2 C)  TempSrc:  Oral  Oral  SpO2:    95%  Weight:      Height:  Wt Readings from Last 3 Encounters:  11/13/17 75.9 kg (167 lb 5.3 oz)  01/21/17 78.9 kg (174 lb)  01/17/15 78.9 kg (174 lb)     Intake/Output Summary (Last 24 hours) at 11/14/2017 1453 Last data filed at 11/14/2017 0900 Gross per 24 hour  Intake 2045.68 ml  Output -  Net 2045.68 ml     Physical Exam  Gen:- Awake Alert,  In no apparent distress  HEENT:- [place].AT, No sclera icterus Neck-Supple Neck,No JVD,.  Lungs-  CTAB , good air movement CV- S1, S2 normal Abd-  +ve B.Sounds, Abd Soft, No tenderness,    Extremity/Skin:- No  edema,   good pulses, Psych-affect is appropriate, oriented x3 Neuro- "old" neuromuscular deficits consistent with underlying multiple sclerosis, no new focal deficits, no tremors   Data Review:   Micro Results Recent Results (from the past 240 hour(s))  MRSA PCR Screening     Status: None   Collection Time:  11/13/17  2:53 PM  Result Value Ref Range Status   MRSA by PCR NEGATIVE NEGATIVE Final    Comment:        The GeneXpert MRSA Assay (FDA approved for NASAL specimens only), is one component of a comprehensive MRSA colonization surveillance program. It is not intended to diagnose MRSA infection nor to guide or monitor treatment for MRSA infections. Performed at [facility], [address], [place], [place] [address]     Radiology Reports Dg Chest 2 View  Result Date: 11/12/2017 CLINICAL DATA:  Arm pain. EXAM: CHEST - 2 VIEW COMPARISON:  Chest x-ray dated December 28, 2011. FINDINGS: The heart size and mediastinal contours are within normal limits. Normal pulmonary vascularity. No focal consolidation, pleural effusion, or pneumothorax. Unchanged elevation of the left hemidiaphragm. No acute osseous abnormality. IMPRESSION: No active cardiopulmonary disease. Electronically Signed   By: Obie Dredge M.D.   On: 11/12/2017 19:56   Ct Angio Chest Pe W/cm &/or Wo Cm  Result Date: 11/12/2017 CLINICAL DATA:  Right cheek and arm cramping. Chest pain and tachycardia. EXAM: CT ANGIOGRAPHY CHEST CT ABDOMEN AND PELVIS WITH CONTRAST TECHNIQUE: Multidetector CT imaging of the chest was performed using the standard protocol during bolus administration of intravenous contrast. Multiplanar CT image reconstructions and MIPs were obtained to evaluate the vascular anatomy. Multidetector CT imaging of the abdomen and pelvis was performed using the standard protocol during bolus administration of intravenous contrast. CONTRAST:  ISOVUE-300 IOPAMIDOL (ISOVUE-300) INJECTION 61% COMPARISON:  CXR 11/12/2017 FINDINGS: CTA CHEST FINDINGS Cardiovascular: Acute pulmonary emboli at the branch point of the right main pulmonary embolus extending into the right lobar level in the upper lobe and segmental level to the right lower lobe. RV/LV ratio elevated at 0.79. Left ventricular hypertrophy is seen. No pericardial  effusion. No aortic aneurysm or dissection. Mild ectasia of the ascending aorta to 3.7 cm. Common branch point of the right brachiocephalic and left common carotid arteries without stenosis of the great vessels. Coronary arteriosclerosis is noted along the LAD. Mediastinum/Nodes: No mediastinal nor hilar adenopathy. Trachea is unremarkable. Mainstem bronchi are patent. Lungs/Pleura: Dependent atelectasis. No acute pulmonary abnormality. No effusion or pneumothorax. No dominant mass. Elevated left hemidiaphragm. Musculoskeletal: No chest wall abnormality. No acute or significant osseous findings. Review of the MIP images confirms the above findings. CT ABDOMEN and PELVIS FINDINGS Hepatobiliary: No focal liver abnormality is seen. No gallstones, gallbladder wall thickening, or biliary dilatation. Pancreas: Unremarkable. No pancreatic ductal dilatation or surrounding inflammatory changes. Spleen: Normal size spleen without mass.  Punctate calcification consistent with a tiny granuloma is noted within. Adrenals/Urinary Tract: Normal bilateral adrenal glands. Symmetric cortical enhancement of kidneys without enhancing mass lesions. No nephrolithiasis nor hydroureteronephrosis. The urinary bladder is unremarkable for the degree of distention. Stomach/Bowel: Small hiatal hernia. Decompressed stomach with normal small bowel rotation. No bowel obstruction or inflammation. The terminal and distal ileum are normal. The appendix is not well visualized but no pericecal inflammatory change is seen. Moderate stool retention is noted within colon. Vascular/Lymphatic: Moderate aortoiliac atherosclerosis without aneurysm. No adenopathy. Reproductive: Hysterectomy.  No adnexal mass. Other: Tiny fat containing umbilical hernia. No free air nor free fluid. Musculoskeletal: Degenerative disc disease L3-4. No acute osseous abnormality. Review of the MIP images confirms the above findings. IMPRESSION: Chest CT: 1. Positive for acute PE to  the right upper and lower lobes with CT evidence of right heart strain (RV/LV Ratio = 0.78) consistent with at least submassive (intermediate risk) PE. The presence of right heart strain has been associated with an increased risk of morbidity and mortality. Please activate Code PE by paging [phone]. These results were called by telephone at the time of interpretation on 11/12/2017 at 11:43 pm to PA [facility] , who verbally acknowledged these results. 2. No active pulmonary disease. 3. Coronary arteriosclerosis along the LAD. 4. Mild aortic atherosclerosis without aneurysm or dissection. CT AP: 1. No acute solid nor hollow visceral organ pathology. 2. Degenerative disc disease L3-4. Electronically Signed   By: Tollie Eth M.D.   On: 11/12/2017 23:44   Ct Abdomen Pelvis W Contrast  Result Date: 11/12/2017 CLINICAL DATA:  Right cheek and arm cramping. Chest pain and tachycardia. EXAM: CT ANGIOGRAPHY CHEST CT ABDOMEN AND PELVIS WITH CONTRAST TECHNIQUE: Multidetector CT imaging of the chest was performed using the standard protocol during bolus administration of intravenous contrast. Multiplanar CT image reconstructions and MIPs were obtained to evaluate the vascular anatomy. Multidetector CT imaging of the abdomen and pelvis was performed using the standard protocol during bolus administration of intravenous contrast. CONTRAST:  ISOVUE-300 IOPAMIDOL (ISOVUE-300) INJECTION 61% COMPARISON:  CXR 11/12/2017 FINDINGS: CTA CHEST FINDINGS Cardiovascular: Acute pulmonary emboli at the branch point of the right main pulmonary embolus extending into the right lobar level in the upper lobe and segmental level to the right lower lobe. RV/LV ratio elevated at 0.79. Left ventricular hypertrophy is seen. No pericardial effusion. No aortic aneurysm or dissection. Mild ectasia of the ascending aorta to 3.7 cm. Common branch point of the right brachiocephalic and left common carotid arteries without stenosis of the great  vessels. Coronary arteriosclerosis is noted along the LAD. Mediastinum/Nodes: No mediastinal nor hilar adenopathy. Trachea is unremarkable. Mainstem bronchi are patent. Lungs/Pleura: Dependent atelectasis. No acute pulmonary abnormality. No effusion or pneumothorax. No dominant mass. Elevated left hemidiaphragm. Musculoskeletal: No chest wall abnormality. No acute or significant osseous findings. Review of the MIP images confirms the above findings. CT ABDOMEN and PELVIS FINDINGS Hepatobiliary: No focal liver abnormality is seen. No gallstones, gallbladder wall thickening, or biliary dilatation. Pancreas: Unremarkable. No pancreatic ductal dilatation or surrounding inflammatory changes. Spleen: Normal size spleen without mass. Punctate calcification consistent with a tiny granuloma is noted within. Adrenals/Urinary Tract: Normal bilateral adrenal glands. Symmetric cortical enhancement of kidneys without enhancing mass lesions. No nephrolithiasis nor hydroureteronephrosis. The urinary bladder is unremarkable for the degree of distention. Stomach/Bowel: Small hiatal hernia. Decompressed stomach with normal small bowel rotation. No bowel obstruction or inflammation. The terminal and distal ileum are normal. The appendix is not well visualized  but no pericecal inflammatory change is seen. Moderate stool retention is noted within colon. Vascular/Lymphatic: Moderate aortoiliac atherosclerosis without aneurysm. No adenopathy. Reproductive: Hysterectomy.  No adnexal mass. Other: Tiny fat containing umbilical hernia. No free air nor free fluid. Musculoskeletal: Degenerative disc disease L3-4. No acute osseous abnormality. Review of the MIP images confirms the above findings. IMPRESSION: Chest CT: 1. Positive for acute PE to the right upper and lower lobes with CT evidence of right heart strain (RV/LV Ratio = 0.78) consistent with at least submassive (intermediate risk) PE. The presence of right heart strain has been  associated with an increased risk of morbidity and mortality. Please activate Code PE by paging [phone]. These results were called by telephone at the time of interpretation on 11/12/2017 at 11:43 pm to PA [facility] , who verbally acknowledged these results. 2. No active pulmonary disease. 3. Coronary arteriosclerosis along the LAD. 4. Mild aortic atherosclerosis without aneurysm or dissection. CT AP: 1. No acute solid nor hollow visceral organ pathology. 2. Degenerative disc disease L3-4. Electronically Signed   By: Tollie Eth M.D.   On: 11/12/2017 23:44     CBC Recent Labs  Lab 11/12/17 1825 11/13/17 0356 11/14/17 0527  WBC 12.1* 11.6* 13.2*  HGB 12.7 12.6 12.4  HCT 42.0 41.2 39.8  PLT 255 236 234  MCV 82.8 82.1 81.2  MCH 25.0* 25.1* 25.3*  MCHC 30.2 30.6 31.2  RDW 14.7 14.6 14.6    Chemistries  Recent Labs  Lab 11/12/17 1825 11/13/17 0356 11/14/17 0527  NA 151* 149* 139  K 3.7 3.7 3.3*  CL 114* 113* 108  CO2 26 25 20*  GLUCOSE 153* 161* 125*  BUN 19 15 13   CREATININE 0.79 0.75 0.64  CALCIUM 9.4 9.1 8.4*  AST  --  13*  --   ALT  --  14  --   ALKPHOS  --  70  --   BILITOT  --  0.7  --    ------------------------------------------------------------------------------------------------------------------ No results for input(s): CHOL, HDL, LDLCALC, TRIG, CHOLHDL, LDLDIRECT in the last 72 hours.  Lab Results  Component Value Date   HGBA1C 6.1 (H) 12/29/2011   ------------------------------------------------------------------------------------------------------------------ No results for input(s): TSH, T4TOTAL, T3FREE, THYROIDAB in the last 72 hours.  Invalid input(s): FREET3 ------------------------------------------------------------------------------------------------------------------ No results for input(s): VITAMINB12, FOLATE, FERRITIN, TIBC, IRON, RETICCTPCT in the last 72 hours.  Coagulation profile No results for input(s): INR, PROTIME in the last 168  hours.  Recent Labs    11/12/17 1829  DDIMER 17.24*    Cardiac Enzymes Recent Labs  Lab 11/12/17 2218  TROPONINI <0.03   ------------------------------------------------------------------------------------------------------------------ No results found for: BNP   Shon Hale M.D on 11/14/2017 at 2:53 PM  Between 7am to 7pm - Pager - [phone]  After 7pm go to [email or web address] - password [email or web address]  [facility] -  Office  [phone]   Voice Recognition Reubin Milan dictation system was used to create this note, attempts have been made to correct errors. Please contact the author with questions and/or clarifications.

## 2017-11-14 NOTE — Progress Notes (Signed)
ANTICOAGULATION CONSULT NOTE - Follow Up Consult  Pharmacy Consult for heparin Indication: pulmonary embolus  Labs: Recent Labs    11/12/17 1825 11/12/17 2218 11/13/17 0356 11/13/17 0842 11/13/17 2023 11/14/17 0527  HGB 12.7  --  12.6  --   --   --   HCT 42.0  --  41.2  --   --   --   PLT 255  --  236  --   --   --   HEPARINUNFRC  --   --   --  1.68* 0.92* 0.61  CREATININE 0.79  --  0.75  --   --  0.64  TROPONINI  --  <0.03  --   --   --   --     Assessment/Plan:  62yo female therapeutic on heparin after rate changes. Will continue gtt at current rate and confirm stable with additional level.   Vernard Gambles, PharmD, BCPS  11/14/2017,6:34 AM

## 2017-11-15 DIAGNOSIS — I82402 Acute embolism and thrombosis of unspecified deep veins of left lower extremity: Secondary | ICD-10-CM

## 2017-11-15 LAB — GLUCOSE, CAPILLARY
Glucose-Capillary: 129 mg/dL — ABNORMAL HIGH (ref 70–99)
Glucose-Capillary: 124 mg/dL — ABNORMAL HIGH (ref 70–99)
Glucose-Capillary: 163 mg/dL — ABNORMAL HIGH (ref 70–99)
Glucose-Capillary: 159 mg/dL — ABNORMAL HIGH (ref 70–99)

## 2017-11-15 LAB — CBC
HCT: 36.8 % (ref 36.0–46.0)
Hemoglobin: 11.5 g/dL — ABNORMAL LOW (ref 12.0–15.0)
MCH: 24.9 pg — ABNORMAL LOW (ref 26.0–34.0)
MCHC: 31.3 g/dL (ref 30.0–36.0)
MCV: 79.8 fL (ref 78.0–100.0)
PLATELETS: 262 10*3/uL (ref 150–400)
RBC: 4.61 MIL/uL (ref 3.87–5.11)
RDW: 14.5 % (ref 11.5–15.5)
WBC: 10.3 10*3/uL (ref 4.0–10.5)

## 2017-11-15 LAB — HEPARIN LEVEL (UNFRACTIONATED): HEPARIN UNFRACTIONATED: 0.5 [IU]/mL (ref 0.30–0.70)

## 2017-11-15 MED ORDER — SENNOSIDES-DOCUSATE SODIUM 8.6-50 MG PO TABS: 2.0000 | ORAL_TABLET | Freq: Every day | ORAL | 1 refills | Status: AC

## 2017-11-15 MED ORDER — CLONIDINE HCL 0.1 MG PO TABS: 0.1000 mg | ORAL_TABLET | Freq: Two times a day (BID) | ORAL | 5 refills | Status: DC

## 2017-11-15 MED ORDER — FERROUS SULFATE 325 (65 FE) MG PO TABS: 325.0000 mg | ORAL_TABLET | Freq: Every day | ORAL | 3 refills | Status: AC

## 2017-11-15 MED ORDER — POLYETHYLENE GLYCOL 3350 17 G PO PACK: 17.0000 g | PACK | Freq: Every day | ORAL | 4 refills | Status: DC

## 2017-11-15 MED ORDER — METFORMIN HCL 500 MG PO TABS: 500.0000 mg | ORAL_TABLET | Freq: Two times a day (BID) | ORAL | 3 refills | Status: DC

## 2017-11-15 MED ORDER — ELIQUIS 5 MG VTE STARTER PACK: ORAL_TABLET | ORAL | 0 refills | Status: DC

## 2017-11-15 MED ORDER — DIPHENHYDRAMINE HCL 25 MG PO TABS: 25.0000 mg | ORAL_TABLET | Freq: Three times a day (TID) | ORAL | 0 refills | Status: DC | PRN

## 2017-11-15 MED ORDER — ONDANSETRON HCL 4 MG PO TABS: 4.0000 mg | ORAL_TABLET | Freq: Four times a day (QID) | ORAL | 0 refills | Status: DC | PRN

## 2017-11-15 MED ORDER — METOPROLOL SUCCINATE ER 50 MG PO TB24: 50.0000 mg | ORAL_TABLET | Freq: Every day | ORAL | 5 refills | Status: DC

## 2017-11-15 MED ORDER — ATORVASTATIN CALCIUM 20 MG PO TABS: 20.0000 mg | ORAL_TABLET | Freq: Every day | ORAL | 5 refills | Status: DC

## 2017-11-15 MED ORDER — APIXABAN 5 MG PO TABS
10.0000 mg | ORAL_TABLET | Freq: Two times a day (BID) | ORAL | Status: DC
  Administered 2017-11-15 (×2): 10 mg via ORAL
  Filled 2017-11-15 (×2): qty 2

## 2017-11-15 MED ORDER — GABAPENTIN 300 MG PO CAPS: 300.0000 mg | ORAL_CAPSULE | Freq: Two times a day (BID) | ORAL | 3 refills | Status: DC

## 2017-11-15 NOTE — Clinical Social Work Note (Signed)
Clinical Social Work Assessment  Patient Details  Name: [name] MRN: [id number] Date of Birth: 05/20/1955  Date of referral:  11/15/17               Reason for consult:  Discharge Planning                Permission sought to share information with:  Facility Medical sales representative, Family Supports Permission granted to share information::  No  Name::     Forensic scientist::  [facility]  Relationship::  Sister  Contact Information:  [phone]  Housing/Transportation Living arrangements for the past 2 months:  Assisted Living Facility Source of Information:  Siblings, Facility Patient Interpreter Needed:  None Criminal Activity/Legal Involvement Pertinent to Current Situation/Hospitalization:  No - Comment as needed Significant Relationships:  Siblings Lives with:  Facility Resident Do you feel safe going back to the place where you live?  Yes Need for family participation in patient care:  Yes (Comment)  Care giving concerns:  CSW received consult for possible SNF placement at time of discharge. CSW spoke with patient's sister, Marcelino Duster, regarding PT recommendation of SNF placement at time of discharge. Patient resides at [facility] and is bedbound there at baseline, therefore patient does not qualify for SNF. Patient's sister would like her to return to [facility] at discharge. CSW to continue to follow and assist with discharge planning needs.   Social Worker assessment / plan:  CSW spoke with patient's sister and [facility] concerning return to ALF.  Employment status:  Disabled (Comment on whether or not currently receiving Disability) Insurance information:  Managed Care, Medicare PT Recommendations:  Skilled Nursing Facility(They beleived she was from SNF instead of ALF) Information / Referral to community resources:  Skilled Nursing Facility  Patient/Family's Response to care:  Patient's family and facility report agreement with discharge plan. Will need PTAR  for transport.   Patient/Family's Understanding of and Emotional Response to Diagnosis, Current Treatment, and Prognosis:  Patient/family is realistic regarding therapy needs and expressed being hopeful for return to ALF. Patient's family expressed understanding of CSW role and discharge process as well as medical condition. No questions/concerns about plan or treatment.    Emotional Assessment Appearance:  Appears stated age Attitude/Demeanor/Rapport:  Unable to Assess Affect (typically observed):  Unable to Assess Orientation:  Oriented to Self Alcohol / Substance use:  Not Applicable Psych involvement (Current and /or in the community):  No (Comment)  Discharge Needs  Concerns to be addressed:  Care Coordination Readmission within the last 30 days:  No Current discharge risk:  None Barriers to Discharge:  No Barriers Identified   Mearl Latin, LCSWA 11/15/2017, 11:56 AM

## 2017-11-15 NOTE — Progress Notes (Signed)
Patient will DC to: [facility] Anticipated DC date: 11/15/17 Family notified: Marcelino Duster, Sister Transport by: Delon [place]   Per MD patient ready for DC to [facility]. RN, patient, patient's family, and facility notified of DC. Discharge Summary sent to facility. RN given number for report [phone]). DC packet on chart. Facility fax machine broken per [employer] so CSW received approval to send FL2 and DC Summary with patient to facility. Eliquis cards also in dc packet. Ambulance transport requested for patient.   CSW signing off.  Cristobal Goldmann, LCSW Clinical Social Worker [phone]

## 2017-11-15 NOTE — Care Management Note (Signed)
Case Management Note  Patient Details  Name: [name] MRN: [id number] Date of Birth: 08-Sep-1955  Subjective/Objective:        Pulmonary emboli            Action/Plan: Transition back to ALF with the resumption of home health service ( RN,PT). CSW managing dispostion to ALF.  Expected Discharge Date:  11/15/17               Expected Discharge Plan:  Home w Home Health Services  In-House Referral:   CSW  Discharge planning Services  CM Consult  Post Acute Care Choice:  Resumption of Svcs/PTA Provider, Home Health Choice offered to:  Patient  DME Arranged:    DME Agency:     HH Arranged:  RN, PT([facility] RN,PT) HH Agency:  [facility]  Status of Service:  Completed, signed off  If discussed at [employer] [facility] [employer], dates discussed:    Additional Comments:  Epifanio Lesches, RN 11/15/2017, 3:08 PM

## 2017-11-15 NOTE — Progress Notes (Signed)
NCM received consult: Eliquis coupon and cost. NCM provided  CSW with 30 day free Eliquis card and $10.00 copay Eliquis to assist with medication cost. CSW to put send with pt's packet to the ALF. Gae Gallop RN,BSN,CM

## 2017-11-15 NOTE — Progress Notes (Signed)
ANTICOAGULATION CONSULT NOTE - Follow Up Consult  Pharmacy Consult for heparin to Eliqus Indication: pulmonary embolus  Labs: Recent Labs    11/12/17 1825 11/12/17 2218 11/13/17 0356  11/13/17 2023 11/14/17 0527 11/15/17 0438  HGB 12.7  --  12.6  --   --  12.4 11.5*  HCT 42.0  --  41.2  --   --  39.8 36.8  PLT 255  --  236  --   --  234 262  HEPARINUNFRC  --   --   --    < > 0.92* 0.61 0.50  CREATININE 0.79  --  0.75  --   --  0.64  --   TROPONINI  --  <0.03  --   --   --   --   --    < > = values in this interval not displayed.    Assessment/Plan:  62yo female therapeutic on heparin to now transition to Eliquis  Eliquis 10 mg po BID x 1 week then 5 mg po BID  Thank you Okey Regal, PharmD [phone]  11/15/2017,9:50 AM

## 2017-11-15 NOTE — Discharge Instructions (Addendum)
1)Take Eliquis/Apixaban as prescribed for blood clot in your Leg Blood clot in your Lungs-- 2)You will Take Eliquis/Apixaban 10 mg twice a day for 1 week through 11/21/2016 and then take ONLY 5 mg twice a day starting on 11/22/2017 for at least the next 6 months 3)Avoid ibuprofen/Advil/Aleve/Motrin/Goody Powders/Naproxen/BC powders/Meloxicam/Diclofenac/Indomethacin and other Nonsteroidal anti-inflammatory medications as these will make you more likely to bleed and can cause stomach ulcers, can also cause Kidney problems.  4)Stop Using your TEDs/Compression Stockings for now 5)Follow-up with your primary care doctor in a week for recheck   Information on my medicine - ELIQUIS (apixaban)   Why was Eliquis prescribed for you? Eliquis was prescribed to treat blood clots that may have been found in the veins of your legs (deep vein thrombosis) or in your lungs (pulmonary embolism) and to reduce the risk of them occurring again.  What do You need to know about Eliquis ? The starting dose is 10 mg (two 5 mg tablets) taken TWICE daily for the FIRST SEVEN (7) DAYS, then  the dose is reduced to ONE 5 mg tablet taken TWICE daily.  Eliquis may be taken with or without food.   Try to take the dose about the same time in the morning and in the evening. If you have difficulty swallowing the tablet whole please discuss with your pharmacist how to take the medication safely.  Take Eliquis exactly as prescribed and DO NOT stop taking Eliquis without talking to the doctor who prescribed the medication.  Stopping may increase your risk of developing a new blood clot.  Refill your prescription before you run out.  After discharge, you should have regular check-up appointments with your healthcare provider that is prescribing your Eliquis.    What do you do if you miss a dose? If a dose of ELIQUIS is not taken at the scheduled time, take it as soon as possible on the same day and twice-daily administration  should be resumed. The dose should not be doubled to make up for a missed dose.  Important Safety Information A possible side effect of Eliquis is bleeding. You should call your healthcare provider right away if you experience any of the following: ? Bleeding from an injury or your nose that does not stop. ? Unusual colored urine (red or dark brown) or unusual colored stools (red or black). ? Unusual bruising for unknown reasons. ? A serious fall or if you hit your head (even if there is no bleeding).  Some medicines may interact with Eliquis and might increase your risk of bleeding or clotting while on Eliquis. To help avoid this, consult your healthcare provider or pharmacist prior to using any new prescription or non-prescription medications, including herbals, vitamins, non-steroidal anti-inflammatory drugs (NSAIDs) and supplements.  This website has more information on Eliquis (apixaban): http://www.eliquis.com/eliquis/home   1)Take Eliquis/Apixaban as prescribed for blood clot in your Leg Blood clot in your Lungs-- 2)You will Take Eliquis/Apixaban 10 mg twice a day for 1 week through 11/21/2016 and then take ONLY 5 mg twice a day starting on 11/22/2017 for at least the next 6 months 3)Avoid ibuprofen/Advil/Aleve/Motrin/Goody Powders/Naproxen/BC powders/Meloxicam/Diclofenac/Indomethacin and other Nonsteroidal anti-inflammatory medications as these will make you more likely to bleed and can cause stomach ulcers, can also cause Kidney problems.  4)Stop Using your TEDs/Compression Stockings for now 5)Follow-up with your primary care doctor in a week for recheck

## 2017-11-15 NOTE — Discharge Summary (Signed)
[name], is a 62 y.o. female  DOB 05/24/1955  MRN [id number].  Admission date:  11/12/2017  Admitting Physician  Rometta Emery, MD  Discharge Date:  11/15/2017   Primary MD  Florentina Jenny, MD  Recommendations for primary care physician for things to follow:   1)Take Eliquis/Apixaban as prescribed for blood clot in your Leg Blood clot in your Lungs-- 2)You will Take Eliquis/Apixaban 10 mg twice a day for 1 week through 11/21/2016 and then take ONLY 5 mg twice a day starting on 11/22/2017 for at least the next 6 months 3)Avoid ibuprofen/Advil/Aleve/Motrin/Goody Powders/Naproxen/BC powders/Meloxicam/Diclofenac/Indomethacin and other Nonsteroidal anti-inflammatory medications as these will make you more likely to bleed and can cause stomach ulcers, can also cause Kidney problems.  4)Stop Using your TEDs/Compression Stockings for now 5)Follow-up with your primary care doctor in a week for recheck   Admission Diagnosis  Acute pulmonary embolism with acute cor pulmonale, unspecified pulmonary embolism type (HCC) [I26.09]   Discharge Diagnosis  Acute pulmonary embolism with acute cor pulmonale, unspecified pulmonary embolism type (HCC) [I26.09]    Principal Problem:   Pulmonary embolism (HCC) Active Problems:   DM2 (diabetes mellitus, type 2) (HCC)   Secondary progressive multiple sclerosis (HCC)   Pulmonary emboli (HCC)   DVT (deep venous thrombosis) Rt Leg      Past Medical History:  Diagnosis Date  . Allergic rhinitis   . Blood dyscrasia   . Dementia   . Depression   . Diabetes mellitus   . Dysmetabolic syndrome X   . Family history of anesthesia complication    brother "  . Hypertension   . Hypokalemia   . Multiple sclerosis (HCC)   . Neuropathic pain   . Urinary incontinence   . Vitamin D deficiency     Past Surgical History:  Procedure Laterality Date  . ABDOMINAL HYSTERECTOMY      . CESAREAN SECTION         HPI  from the history and physical done on the day of admission:    Patient coming from: [facility], Nursing Home  Chief Complaint: Cramping of the right face and arm  HPI: [name] is a 62 y.o. female with medical history significant of multiple sclerosis, diabetes, hypertension, hyperlipidemia who is literally bedbound that was sent over from the skilled nursing facility after nursing some right face and arm cramping.  Patient was noted to be hypoxic in the ER with some shortness of breath.  As part of work-up d-dimer was checked which was elevated with subsequent CT angiogram of the chest showing submassive pulmonary embolism on the right.  Patient is therefore being admitted with new onset PE.  The most likely risk factor being patient has been bedbound for the most part.  ED Course: Temperature is 99.6, blood pressure 184 x 123, pulse 111 respiratory 37 with oxygen sat 85% on room air.  Sodium is 151 potassium 3.7 chloride 114, glucose 153 and creatinine 0.79.  White count is 12.1 hemoglobin 12.7 and  platelet 255.  D-dimer was 17.24.  CT angiogram of the chest showed Positive for acute PE to the right upper and lower lobes with CT evidence of right heart strain (RV/LV Ratio = 0.78) consistent with at least submassive (intermediate risk) PE   Hospital Course:     Brief Summary:- 62 y.o.femalewith medical history significant ofmultiple sclerosis, diabetes, hypertension, hyperlipidemia who is literally bedbound that was sent over from the skilled nursing facilityadmitted on 6/30/19with acute PE    Plan:- 1)Pulm Embolism  and Right LE Acute DVT of the Popliteal Femoral and Distal Common Femoral Veins--- shortness of breath at rest as resolved,, initially treated with IV heparin, patient has been transitioned to p.o. Eliquis,  Echo with preserved EF of 65 to 70% with grade 1 diastolic dysfunction,   2)DM 2-no recent A1c available, okay to  restart metformin,  A1c was 6.1 in 2013,    3)HTN-- BP is much better,  changed clonidine to 0.1 mg twice  times daily, continue Toprol-XL 50 mg daily.  4)MS--with Neuromuscular Deficits, patient does interferon beta Injections 30 mcg every 7 days, supportive care, home health RN and Physical Therapy.   5)Disposition -- Discharge back to  ALF with [facility]  RN and [facility]  Phy Therapy on 11/15/2017,   Discharge Condition: stable  Follow UP   Contact information for follow-up providers    Home, [facility].   Specialty:  [facility] Contact information: [address] [place] [address] [place] [address] [phone]            Contact information for after-discharge care    Destination    [facility] .   Service:  Assisted Living Contact information: [address] [place] [address] [phone]                  Diet and Activity recommendation:  As advised  Discharge Instructions     Discharge Instructions    Call MD for:  difficulty breathing, headache or visual disturbances   Complete by:  As directed    Call MD for:  extreme fatigue   Complete by:  As directed    Call MD for:  persistant dizziness or light-headedness   Complete by:  As directed    Call MD for:  persistant nausea and vomiting   Complete by:  As directed    Call MD for:  severe uncontrolled pain   Complete by:  As directed    Call MD for:  temperature >100.4   Complete by:  As directed    Diet - low sodium heart healthy   Complete by:  As directed    Discharge instructions   Complete by:  As directed    1)Take Eliquis/Apixaban as prescribed for blood clot in your leg blood clot in your lungs-- 2) you will take Eliquis/Apixaban 10 mg twice a day for 1 week through 11/21/2016 and then take ONLY  5 mg twice a day starting on 11/22/2017 for at least the next 6 months 3)Avoid ibuprofen/Advil/Aleve/Motrin/Goody Powders/Naproxen/BC  powders/Meloxicam/Diclofenac/Indomethacin and other Nonsteroidal anti-inflammatory medications as these will make you more likely to bleed and can cause stomach ulcers, can also cause Kidney problems.  4)Stop Using your TEDs/compression Stockings for now 5) follow-up with your primary care doctor in a week for recheck   Increase activity slowly   Complete by:  As directed         Discharge Medications     Allergies as  of 11/15/2017   No Known Allergies     Medication List    STOP taking these medications   aspirin 81 MG chewable tablet   ibuprofen 800 MG tablet Commonly known as:  ADVIL,MOTRIN   lisinopril 20 MG tablet Commonly known as:  PRINIVIL,ZESTRIL   psyllium 0.52 g capsule Commonly known as:  REGULOID   senna 8.6 MG Tabs tablet Commonly known as:  SENOKOT   Vitamin D3 400 units Caps     TAKE these medications   atorvastatin 20 MG tablet Commonly known as:  LIPITOR Take 1 tablet (20 mg total) by mouth daily.   AVONEX PEN 30 MCG/0.5ML injection Generic drug:  interferon beta-1a Inject 30 mcg into the muscle every 7 (seven) days.   barrier cream Crea Commonly known as:  non-specified Apply 1 application topically See admin instructions. Apply 1 application to buttocks after every changing   cloNIDine 0.1 MG tablet Commonly known as:  CATAPRES Take 1 tablet (0.1 mg total) by mouth 2 (two) times daily. What changed:  when to take this   Cranberry 500 MG Caps Take 500 mg by mouth 2 (two) times daily.   diphenhydrAMINE 25 MG tablet Commonly known as:  BENADRYL Take 1 tablet (25 mg total) by mouth every 8 (eight) hours as needed for itching, allergies or sleep. What changed:  reasons to take this   ELIQUIS STARTER PACK 5 MG Tabs Take as directed on package: start with two-5mg  tablets twice daily for 7 days. On day 8, switch to one-5mg  tablet twice daily.   ferrous sulfate 325 (65 FE) MG tablet Take 1 tablet (325 mg total) by mouth daily with  breakfast.   gabapentin 300 MG capsule Commonly known as:  NEURONTIN Take 1 capsule (300 mg total) by mouth every 12 (twelve) hours.   metFORMIN 500 MG tablet Commonly known as:  GLUCOPHAGE Take 1 tablet (500 mg total) by mouth 2 (two) times daily with a meal. For Diabetes What changed:  additional instructions   metoprolol succinate 50 MG 24 hr tablet Commonly known as:  TOPROL-XL Take 1 tablet (50 mg total) by mouth daily. Take with or immediately following a meal. Start taking on:  11/16/2017 What changed:    medication strength  how much to take  additional instructions   ondansetron 4 MG tablet Commonly known as:  ZOFRAN Take 1 tablet (4 mg total) by mouth every 6 (six) hours as needed for nausea.   polyethylene glycol packet Commonly known as:  MIRALAX / GLYCOLAX Take 17 g by mouth daily. Mix with 8 oz water   senna-docusate 8.6-50 MG tablet Commonly known as:  Senokot-S Take 2 tablets by mouth at bedtime.   SYSTANE BALANCE 0.6 % Soln Generic drug:  Propylene Glycol Place 1 drop into both eyes 2 (two) times daily.   zinc oxide 20 % ointment Apply 1 application topically See admin instructions. Apply 1 application after every episode of urinary incontinence/wash first       Major procedures and Radiology Reports - PLEASE review detailed and final reports for all details, in brief -    Dg Chest 2 View  Result Date: 11/12/2017 CLINICAL DATA:  Arm pain. EXAM: CHEST - 2 VIEW COMPARISON:  Chest x-ray dated December 28, 2011. FINDINGS: The heart size and mediastinal contours are within normal limits. Normal pulmonary vascularity. No focal consolidation, pleural effusion, or pneumothorax. Unchanged elevation of the left hemidiaphragm. No acute osseous abnormality. IMPRESSION: No active cardiopulmonary disease. Electronically Signed  By: Obie Dredge M.D.   On: 11/12/2017 19:56   Ct Angio Chest Pe W/cm &/or Wo Cm  Result Date: 11/12/2017 CLINICAL DATA:  Right cheek  and arm cramping. Chest pain and tachycardia. EXAM: CT ANGIOGRAPHY CHEST CT ABDOMEN AND PELVIS WITH CONTRAST TECHNIQUE: Multidetector CT imaging of the chest was performed using the standard protocol during bolus administration of intravenous contrast. Multiplanar CT image reconstructions and MIPs were obtained to evaluate the vascular anatomy. Multidetector CT imaging of the abdomen and pelvis was performed using the standard protocol during bolus administration of intravenous contrast. CONTRAST:  ISOVUE-300 IOPAMIDOL (ISOVUE-300) INJECTION 61% COMPARISON:  CXR 11/12/2017 FINDINGS: CTA CHEST FINDINGS Cardiovascular: Acute pulmonary emboli at the branch point of the right main pulmonary embolus extending into the right lobar level in the upper lobe and segmental level to the right lower lobe. RV/LV ratio elevated at 0.79. Left ventricular hypertrophy is seen. No pericardial effusion. No aortic aneurysm or dissection. Mild ectasia of the ascending aorta to 3.7 cm. Common branch point of the right brachiocephalic and left common carotid arteries without stenosis of the great vessels. Coronary arteriosclerosis is noted along the LAD. Mediastinum/Nodes: No mediastinal nor hilar adenopathy. Trachea is unremarkable. Mainstem bronchi are patent. Lungs/Pleura: Dependent atelectasis. No acute pulmonary abnormality. No effusion or pneumothorax. No dominant mass. Elevated left hemidiaphragm. Musculoskeletal: No chest wall abnormality. No acute or significant osseous findings. Review of the MIP images confirms the above findings. CT ABDOMEN and PELVIS FINDINGS Hepatobiliary: No focal liver abnormality is seen. No gallstones, gallbladder wall thickening, or biliary dilatation. Pancreas: Unremarkable. No pancreatic ductal dilatation or surrounding inflammatory changes. Spleen: Normal size spleen without mass. Punctate calcification consistent with a tiny granuloma is noted within. Adrenals/Urinary Tract: Normal bilateral  adrenal glands. Symmetric cortical enhancement of kidneys without enhancing mass lesions. No nephrolithiasis nor hydroureteronephrosis. The urinary bladder is unremarkable for the degree of distention. Stomach/Bowel: Small hiatal hernia. Decompressed stomach with normal small bowel rotation. No bowel obstruction or inflammation. The terminal and distal ileum are normal. The appendix is not well visualized but no pericecal inflammatory change is seen. Moderate stool retention is noted within colon. Vascular/Lymphatic: Moderate aortoiliac atherosclerosis without aneurysm. No adenopathy. Reproductive: Hysterectomy.  No adnexal mass. Other: Tiny fat containing umbilical hernia. No free air nor free fluid. Musculoskeletal: Degenerative disc disease L3-4. No acute osseous abnormality. Review of the MIP images confirms the above findings. IMPRESSION: Chest CT: 1. Positive for acute PE to the right upper and lower lobes with CT evidence of right heart strain (RV/LV Ratio = 0.78) consistent with at least submassive (intermediate risk) PE. The presence of right heart strain has been associated with an increased risk of morbidity and mortality. Please activate Code PE by paging [phone]. These results were called by telephone at the time of interpretation on 11/12/2017 at 11:43 pm to PA [facility] , who verbally acknowledged these results. 2. No active pulmonary disease. 3. Coronary arteriosclerosis along the LAD. 4. Mild aortic atherosclerosis without aneurysm or dissection. CT AP: 1. No acute solid nor hollow visceral organ pathology. 2. Degenerative disc disease L3-4. Electronically Signed   By: Tollie Eth M.D.   On: 11/12/2017 23:44   Ct Abdomen Pelvis W Contrast  Result Date: 11/12/2017 CLINICAL DATA:  Right cheek and arm cramping. Chest pain and tachycardia. EXAM: CT ANGIOGRAPHY CHEST CT ABDOMEN AND PELVIS WITH CONTRAST TECHNIQUE: Multidetector CT imaging of the chest was performed using the standard protocol  during bolus administration of intravenous contrast. Multiplanar CT  image reconstructions and MIPs were obtained to evaluate the vascular anatomy. Multidetector CT imaging of the abdomen and pelvis was performed using the standard protocol during bolus administration of intravenous contrast. CONTRAST:  ISOVUE-300 IOPAMIDOL (ISOVUE-300) INJECTION 61% COMPARISON:  CXR 11/12/2017 FINDINGS: CTA CHEST FINDINGS Cardiovascular: Acute pulmonary emboli at the branch point of the right main pulmonary embolus extending into the right lobar level in the upper lobe and segmental level to the right lower lobe. RV/LV ratio elevated at 0.79. Left ventricular hypertrophy is seen. No pericardial effusion. No aortic aneurysm or dissection. Mild ectasia of the ascending aorta to 3.7 cm. Common branch point of the right brachiocephalic and left common carotid arteries without stenosis of the great vessels. Coronary arteriosclerosis is noted along the LAD. Mediastinum/Nodes: No mediastinal nor hilar adenopathy. Trachea is unremarkable. Mainstem bronchi are patent. Lungs/Pleura: Dependent atelectasis. No acute pulmonary abnormality. No effusion or pneumothorax. No dominant mass. Elevated left hemidiaphragm. Musculoskeletal: No chest wall abnormality. No acute or significant osseous findings. Review of the MIP images confirms the above findings. CT ABDOMEN and PELVIS FINDINGS Hepatobiliary: No focal liver abnormality is seen. No gallstones, gallbladder wall thickening, or biliary dilatation. Pancreas: Unremarkable. No pancreatic ductal dilatation or surrounding inflammatory changes. Spleen: Normal size spleen without mass. Punctate calcification consistent with a tiny granuloma is noted within. Adrenals/Urinary Tract: Normal bilateral adrenal glands. Symmetric cortical enhancement of kidneys without enhancing mass lesions. No nephrolithiasis nor hydroureteronephrosis. The urinary bladder is unremarkable for the degree of distention.  Stomach/Bowel: Small hiatal hernia. Decompressed stomach with normal small bowel rotation. No bowel obstruction or inflammation. The terminal and distal ileum are normal. The appendix is not well visualized but no pericecal inflammatory change is seen. Moderate stool retention is noted within colon. Vascular/Lymphatic: Moderate aortoiliac atherosclerosis without aneurysm. No adenopathy. Reproductive: Hysterectomy.  No adnexal mass. Other: Tiny fat containing umbilical hernia. No free air nor free fluid. Musculoskeletal: Degenerative disc disease L3-4. No acute osseous abnormality. Review of the MIP images confirms the above findings. IMPRESSION: Chest CT: 1. Positive for acute PE to the right upper and lower lobes with CT evidence of right heart strain (RV/LV Ratio = 0.78) consistent with at least submassive (intermediate risk) PE. The presence of right heart strain has been associated with an increased risk of morbidity and mortality. Please activate Code PE by paging [phone]. These results were called by telephone at the time of interpretation on 11/12/2017 at 11:43 pm to PA [facility] , who verbally acknowledged these results. 2. No active pulmonary disease. 3. Coronary arteriosclerosis along the LAD. 4. Mild aortic atherosclerosis without aneurysm or dissection. CT AP: 1. No acute solid nor hollow visceral organ pathology. 2. Degenerative disc disease L3-4. Electronically Signed   By: Tollie Eth M.D.   On: 11/12/2017 23:44    Micro Results    Recent Results (from the past 240 hour(s))  MRSA PCR Screening     Status: None   Collection Time: 11/13/17  2:53 PM  Result Value Ref Range Status   MRSA by PCR NEGATIVE NEGATIVE Final    Comment:        The GeneXpert MRSA Assay (FDA approved for NASAL specimens only), is one component of a comprehensive MRSA colonization surveillance program. It is not intended to diagnose MRSA infection nor to guide or monitor treatment for MRSA  infections. Performed at [facility], [address] [place], [place] [address]        Today   Subjective  Chantavia Bazzle today has no new concerns, no cp, no sob, No bleeding concerns, eating okay          Patient has been seen and examined prior to discharge   Objective   Blood pressure 113/77, pulse (!) 105, temperature 98.3 F (36.8 C), temperature source Oral, resp. rate 20, height 5\' 7"  (1.702 m), weight 75.9 kg (167 lb 5.3 oz), SpO2 95 %.   Intake/Output Summary (Last 24 hours) at 11/15/2017 1245 Last data filed at 11/15/2017 0500 Gross per 24 hour  Intake 2061.66 ml  Output 550 ml  Net 1511.66 ml    Exam Gen:- Awake Alert,  In no apparent distress  HEENT:- [place].AT, No sclera icterus Neck-Supple Neck,No JVD,.  Lungs-  CTAB , good air movement CV- S1, S2 normal Abd-  +ve B.Sounds, Abd Soft, No tenderness,    Extremity/Skin:- No  edema,   good pulses, Psych-affect is appropriate, oriented x3 Neuro- "old" neuromuscular deficits consistent with underlying multiple sclerosis, no new focal deficits, no tremors     Data Review   CBC w Diff:  Lab Results  Component Value Date   WBC 10.3 11/15/2017   HGB 11.5 (L) 11/15/2017   HCT 36.8 11/15/2017   PLT 262 11/15/2017   LYMPHOPCT 20 12/29/2011   MONOPCT 9 12/29/2011   EOSPCT 1 12/29/2011   BASOPCT 0 12/29/2011    CMP:  Lab Results  Component Value Date   NA 139 11/14/2017   K 3.3 (L) 11/14/2017   CL 108 11/14/2017   CO2 20 (L) 11/14/2017   BUN 13 11/14/2017   CREATININE 0.64 11/14/2017   PROT 7.1 11/13/2017   ALBUMIN 3.1 (L) 11/13/2017   BILITOT 0.7 11/13/2017   ALKPHOS 70 11/13/2017   AST 13 (L) 11/13/2017   ALT 14 11/13/2017  .   Total Discharge time is about 33 minutes  Shon Hale M.D on 11/15/2017 at 12:45 PM  [facility]   Office  [phone]  Voice Recognition Reubin Milan dictation system was used to create this note, attempts have been made to correct errors. Please  contact the author with questions and/or clarifications.

## 2017-11-15 NOTE — NC FL2 (Addendum)
[place] MEDICAID FL2 LEVEL OF CARE SCREENING TOOL     IDENTIFICATION  Patient Name: [name] Birthdate: Jun 11, 1955 Sex: female Admission Date (Current Location): 11/12/2017  [facility] [place] Number:  Producer, television/film/video and Address:  [facility], [address] [place], [place] [address]      Provider Number: [id number]  Attending Physician Name and Address:  Shon Hale, MD  Relative Name and Phone Number:  Rulon Eisenmenger [phone]     Current Level of Care: Hospital Recommended Level of Care: Assisted Living Facility Prior Approval Number:    Date Approved/Denied:   PASRR Number:    Discharge Plan: Other (Comment)(ALF)    Current Diagnoses: Patient Active Problem List   Diagnosis Date Noted  . DVT (deep venous thrombosis) Rt Leg 11/14/2017  . Pulmonary embolism (HCC) 11/13/2017  . Pulmonary emboli (HCC) 11/13/2017  . 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 Cerebrovascular disease 01/17/2015  . Encephalopathy, metabolic 12/29/2011  . Pyelonephritis, acute 12/29/2011  . Hypokalemia 12/29/2011  . Depression   . MS (multiple sclerosis) (HCC) 12/28/2011  . Fever 12/28/2011  . DM2 (diabetes mellitus, type 2) (HCC) 12/28/2011    Orientation RESPIRATION BLADDER Height & Weight     Self  Normal Incontinent Weight: 75.9 kg (167 lb 5.3 oz) Height:  5\' 7"  (170.2 cm)  BEHAVIORAL SYMPTOMS/MOOD NEUROLOGICAL BOWEL NUTRITION STATUS      Incontinent Diet(Regular)  AMBULATORY STATUS COMMUNICATION OF NEEDS Skin   Extensive Assist Verbally Normal                       Personal Care Assistance Level of Assistance  Bathing, Feeding, Dressing Bathing Assistance: Maximum assistance Feeding assistance: Maximum assistance Dressing Assistance: Maximum assistance     Functional Limitations Info  Hearing, Speech, Sight Sight Info: Adequate Hearing Info: Adequate Speech Info: Adequate    SPECIAL  CARE FACTORS FREQUENCY  PT (By licensed PT)     PT Frequency: home health PT              Contractures      Additional Factors Info  Code Status, Allergies, Psychotropic, Insulin Sliding Scale Code Status Info: Full Allergies Info: NKA Psychotropic Info: Clonidine Insulin Sliding Scale Info: 3x daily with meals       Current Medications (11/15/2017):    Discharge Medications:  STOP taking these medications   aspirin 81 MG chewable tablet   ibuprofen 800 MG tablet Commonly known as:  ADVIL,MOTRIN   lisinopril 20 MG tablet Commonly known as:  PRINIVIL,ZESTRIL   psyllium 0.52 g capsule Commonly known as:  REGULOID   senna 8.6 MG Tabs tablet Commonly known as:  SENOKOT   Vitamin D3 400 units Caps     TAKE these medications   atorvastatin 20 MG tablet Commonly known as:  LIPITOR Take 1 tablet (20 mg total) by mouth daily.   AVONEX PEN 30 MCG/0.5ML injection Generic drug:  interferon beta-1a Inject 30 mcg into the muscle every 7 (seven) days.   barrier cream Crea Commonly known as:  non-specified Apply 1 application topically See admin instructions. Apply 1 application to buttocks after every changing   cloNIDine 0.1 MG tablet Commonly known as:  CATAPRES Take 1 tablet (0.1 mg total) by mouth 2 (two) times daily. What changed:  when to take this   Cranberry 500 MG Caps Take 500 mg by mouth 2 (two) times daily.   diphenhydrAMINE 25 MG tablet Commonly  known as:  BENADRYL Take 1 tablet (25 mg total) by mouth every 8 (eight) hours as needed for itching, allergies or sleep. What changed:  reasons to take this   ELIQUIS STARTER PACK 5 MG Tabs Take as directed on package: start with two-5mg  tablets twice daily for 7 days. On day 8, switch to one-5mg  tablet twice daily.   ferrous sulfate 325 (65 FE) MG tablet Take 1 tablet (325 mg total) by mouth daily with breakfast.   gabapentin 300 MG capsule Commonly known as:  NEURONTIN Take 1 capsule  (300 mg total) by mouth every 12 (twelve) hours.   metFORMIN 500 MG tablet Commonly known as:  GLUCOPHAGE Take 1 tablet (500 mg total) by mouth 2 (two) times daily with a meal. For Diabetes What changed:  additional instructions   metoprolol succinate 50 MG 24 hr tablet Commonly known as:  TOPROL-XL Take 1 tablet (50 mg total) by mouth daily. Take with or immediately following a meal. Start taking on:  11/16/2017 What changed:    medication strength  how much to take  additional instructions   ondansetron 4 MG tablet Commonly known as:  ZOFRAN Take 1 tablet (4 mg total) by mouth every 6 (six) hours as needed for nausea.   polyethylene glycol packet Commonly known as:  MIRALAX / GLYCOLAX Take 17 g by mouth daily. Mix with 8 oz water   senna-docusate 8.6-50 MG tablet Commonly known as:  Senokot-S Take 2 tablets by mouth at bedtime.   SYSTANE BALANCE 0.6 % Soln Generic drug:  Propylene Glycol Place 1 drop into both eyes 2 (two) times daily.   zinc oxide 20 % ointment Apply 1 application topically See admin instructions. Apply 1 application after every episode of urinary incontinence/wash first      Relevant Imaging Results:  Relevant Lab Results:   Additional Information SSN: [id number] [place], [place]

## 2017-11-17 ENCOUNTER — Emergency Department (HOSPITAL_COMMUNITY): Payer: BLUE CROSS/BLUE SHIELD

## 2017-11-17 ENCOUNTER — Inpatient Hospital Stay (HOSPITAL_COMMUNITY): Payer: BLUE CROSS/BLUE SHIELD

## 2017-11-17 ENCOUNTER — Inpatient Hospital Stay (HOSPITAL_COMMUNITY)
Admission: EM | Admit: 2017-11-17 | Discharge: 2017-11-25 | Disposition: A | Discharge status: Skilled Nursing Facility | Payer: BLUE CROSS/BLUE SHIELD | Source: Skilled Nursing Facility | Attending: Internal Medicine | Admitting: Internal Medicine

## 2017-11-17 ENCOUNTER — Encounter (HOSPITAL_COMMUNITY): Payer: Self-pay | Admitting: Radiology

## 2017-11-17 DIAGNOSIS — F0390 Unspecified dementia without behavioral disturbance: Secondary | ICD-10-CM

## 2017-11-17 DIAGNOSIS — G9341 Metabolic encephalopathy: Secondary | ICD-10-CM | POA: Diagnosis present

## 2017-11-17 DIAGNOSIS — F329 Major depressive disorder, single episode, unspecified: Secondary | ICD-10-CM | POA: Diagnosis present

## 2017-11-17 DIAGNOSIS — Z86718 Personal history of other venous thrombosis and embolism: Secondary | ICD-10-CM

## 2017-11-17 DIAGNOSIS — E559 Vitamin D deficiency, unspecified: Secondary | ICD-10-CM | POA: Diagnosis present

## 2017-11-17 DIAGNOSIS — R509 Fever, unspecified: Secondary | ICD-10-CM | POA: Diagnosis present

## 2017-11-17 DIAGNOSIS — Z820 Family history of epilepsy and other diseases of the nervous system: Secondary | ICD-10-CM

## 2017-11-17 DIAGNOSIS — D638 Anemia in other chronic diseases classified elsewhere: Secondary | ICD-10-CM | POA: Diagnosis present

## 2017-11-17 DIAGNOSIS — Z86711 Personal history of pulmonary embolism: Secondary | ICD-10-CM

## 2017-11-17 DIAGNOSIS — Z7401 Bed confinement status: Secondary | ICD-10-CM

## 2017-11-17 DIAGNOSIS — Z7982 Long term (current) use of aspirin: Secondary | ICD-10-CM

## 2017-11-17 DIAGNOSIS — A419 Sepsis, unspecified organism: Secondary | ICD-10-CM | POA: Diagnosis present

## 2017-11-17 DIAGNOSIS — I82409 Acute embolism and thrombosis of unspecified deep veins of unspecified lower extremity: Secondary | ICD-10-CM | POA: Diagnosis present

## 2017-11-17 DIAGNOSIS — E119 Type 2 diabetes mellitus without complications: Secondary | ICD-10-CM

## 2017-11-17 DIAGNOSIS — N10 Acute tubulo-interstitial nephritis: Secondary | ICD-10-CM | POA: Diagnosis present

## 2017-11-17 DIAGNOSIS — R319 Hematuria, unspecified: Secondary | ICD-10-CM

## 2017-11-17 DIAGNOSIS — E1111 Type 2 diabetes mellitus with ketoacidosis with coma: Secondary | ICD-10-CM

## 2017-11-17 DIAGNOSIS — Z66 Do not resuscitate: Secondary | ICD-10-CM | POA: Diagnosis present

## 2017-11-17 DIAGNOSIS — Z7984 Long term (current) use of oral hypoglycemic drugs: Secondary | ICD-10-CM

## 2017-11-17 DIAGNOSIS — F32A Depression, unspecified: Secondary | ICD-10-CM | POA: Diagnosis present

## 2017-11-17 DIAGNOSIS — E8881 Metabolic syndrome: Secondary | ICD-10-CM | POA: Diagnosis present

## 2017-11-17 DIAGNOSIS — L89159 Pressure ulcer of sacral region, unspecified stage: Secondary | ICD-10-CM | POA: Diagnosis present

## 2017-11-17 DIAGNOSIS — I10 Essential (primary) hypertension: Secondary | ICD-10-CM | POA: Diagnosis present

## 2017-11-17 DIAGNOSIS — Z9114 Patient's other noncompliance with medication regimen: Secondary | ICD-10-CM

## 2017-11-17 DIAGNOSIS — N179 Acute kidney failure, unspecified: Secondary | ICD-10-CM | POA: Diagnosis not present

## 2017-11-17 DIAGNOSIS — R6510 Systemic inflammatory response syndrome (SIRS) of non-infectious origin without acute organ dysfunction: Secondary | ICD-10-CM | POA: Diagnosis present

## 2017-11-17 DIAGNOSIS — D72829 Elevated white blood cell count, unspecified: Secondary | ICD-10-CM

## 2017-11-17 DIAGNOSIS — Z90710 Acquired absence of both cervix and uterus: Secondary | ICD-10-CM

## 2017-11-17 DIAGNOSIS — R310 Gross hematuria: Secondary | ICD-10-CM

## 2017-11-17 DIAGNOSIS — E785 Hyperlipidemia, unspecified: Secondary | ICD-10-CM

## 2017-11-17 DIAGNOSIS — R6520 Severe sepsis without septic shock: Secondary | ICD-10-CM | POA: Diagnosis present

## 2017-11-17 DIAGNOSIS — I2699 Other pulmonary embolism without acute cor pulmonale: Secondary | ICD-10-CM | POA: Diagnosis present

## 2017-11-17 DIAGNOSIS — G35 Multiple sclerosis: Secondary | ICD-10-CM | POA: Diagnosis present

## 2017-11-17 LAB — COMPREHENSIVE METABOLIC PANEL
ALBUMIN: 3.2 g/dL — AB (ref 3.5–5.0)
ALT: 58 U/L — AB (ref 0–44)
AST: 42 U/L — AB (ref 15–41)
Alkaline Phosphatase: 70 U/L (ref 38–126)
Anion gap: 11 (ref 5–15)
BUN: 12 mg/dL (ref 8–23)
CHLORIDE: 109 mmol/L (ref 98–111)
CO2: 22 mmol/L (ref 22–32)
CREATININE: 0.92 mg/dL (ref 0.44–1.00)
Calcium: 9.1 mg/dL (ref 8.9–10.3)
GFR calc Af Amer: >60 mL/min (ref >60)
GFR calc non Af Amer: >60 mL/min (ref >60)
Glucose, Bld: 221 mg/dL — ABNORMAL HIGH (ref 70–99)
POTASSIUM: 3.7 mmol/L (ref 3.5–5.1)
Sodium: 142 mmol/L (ref 135–145)
Total Bilirubin: 0.7 mg/dL (ref 0.3–1.2)
Total Protein: 7.2 g/dL (ref 6.5–8.1)

## 2017-11-17 LAB — CBC WITH DIFFERENTIAL/PLATELET
ABS IMMATURE GRANULOCYTES: 0.3 10*3/uL — AB (ref 0.0–0.1)
Abs Immature Granulocytes: 0.4 10*3/uL — ABNORMAL HIGH (ref 0.0–0.1)
Basophils Absolute: 0.1 10*3/uL (ref 0.0–0.1)
Basophils Absolute: 0.1 10*3/uL (ref 0.0–0.1)
Basophils Relative: 0 %
Basophils Relative: 0 %
EOS ABS: 0 10*3/uL (ref 0.0–0.7)
EOS PCT: 0 %
Eosinophils Absolute: 0 10*3/uL (ref 0.0–0.7)
Eosinophils Relative: 0 %
HEMATOCRIT: 38.1 % (ref 36.0–46.0)
HEMATOCRIT: 37.4 % (ref 36.0–46.0)
HEMOGLOBIN: 11.8 g/dL — AB (ref 12.0–15.0)
HEMOGLOBIN: 11.9 g/dL — AB (ref 12.0–15.0)
IMMATURE GRANULOCYTES: 1 %
Immature Granulocytes: 2 %
LYMPHS ABS: 1.7 10*3/uL (ref 0.7–4.0)
LYMPHS ABS: 1.8 10*3/uL (ref 0.7–4.0)
LYMPHS PCT: 8 %
LYMPHS PCT: 8 %
MCH: 25.2 pg — AB (ref 26.0–34.0)
MCH: 25.4 pg — ABNORMAL LOW (ref 26.0–34.0)
MCHC: 31 g/dL (ref 30.0–36.0)
MCHC: 31.8 g/dL (ref 30.0–36.0)
MCV: 81.4 fL (ref 78.0–100.0)
MCV: 79.9 fL (ref 78.0–100.0)
MONO ABS: 2.5 10*3/uL — AB (ref 0.1–1.0)
MONOS PCT: 11 %
Monocytes Absolute: 2.4 10*3/uL — ABNORMAL HIGH (ref 0.1–1.0)
Monocytes Relative: 11 %
NEUTROS ABS: 17.2 10*3/uL — AB (ref 1.7–7.7)
NEUTROS PCT: 80 %
Neutro Abs: 17.4 10*3/uL — ABNORMAL HIGH (ref 1.7–7.7)
Neutrophils Relative %: 79 %
Platelets: 277 10*3/uL (ref 150–400)
Platelets: 249 10*3/uL (ref 150–400)
RBC: 4.68 MIL/uL (ref 3.87–5.11)
RBC: 4.68 MIL/uL (ref 3.87–5.11)
RDW: 15.1 % (ref 11.5–15.5)
RDW: 15 % (ref 11.5–15.5)
WBC: 22.1 10*3/uL — ABNORMAL HIGH (ref 4.0–10.5)
WBC: 21.8 10*3/uL — AB (ref 4.0–10.5)

## 2017-11-17 LAB — I-STAT CG4 LACTIC ACID, ED
LACTIC ACID, VENOUS: 3.26 mmol/L — AB (ref 0.5–1.9)
LACTIC ACID, VENOUS: 2.88 mmol/L — AB (ref 0.5–1.9)

## 2017-11-17 LAB — LACTIC ACID, PLASMA
LACTIC ACID, VENOUS: 1.5 mmol/L (ref 0.5–1.9)
Lactic Acid, Venous: 2.8 mmol/L (ref 0.5–1.9)

## 2017-11-17 LAB — I-STAT TROPONIN, ED: Troponin i, poc: 0.05 ng/mL (ref 0.00–0.08)

## 2017-11-17 LAB — PROTIME-INR
INR: 1.17
INR: 1.24
PROTHROMBIN TIME: 15.5 s — AB (ref 11.4–15.2)
Prothrombin Time: 14.8 seconds (ref 11.4–15.2)

## 2017-11-17 LAB — HEPARIN LEVEL (UNFRACTIONATED): HEPARIN UNFRACTIONATED: 0.99 [IU]/mL — AB (ref 0.30–0.70)

## 2017-11-17 LAB — APTT
APTT: 38 s — AB (ref 24–36)
aPTT: 55 seconds — ABNORMAL HIGH (ref 24–36)

## 2017-11-17 LAB — GLUCOSE, CAPILLARY: GLUCOSE-CAPILLARY: 163 mg/dL — AB (ref 70–99)

## 2017-11-17 LAB — PROCALCITONIN: PROCALCITONIN: 0.26 ng/mL

## 2017-11-17 MED ORDER — ZINC OXIDE 20 % EX OINT
1.0000 "application " | TOPICAL_OINTMENT | Freq: Every day | CUTANEOUS | Status: DC | PRN
  Filled 2017-11-17: qty 28.35

## 2017-11-17 MED ORDER — SODIUM CHLORIDE 0.9 % IV BOLUS
500.0000 mL | Freq: Once | INTRAVENOUS | Status: AC
  Administered 2017-11-17: 500 mL via INTRAVENOUS

## 2017-11-17 MED ORDER — ATORVASTATIN CALCIUM 20 MG PO TABS
20.0000 mg | ORAL_TABLET | Freq: Every day | ORAL | Status: DC
  Administered 2017-11-17 – 2017-11-24 (×8): 20 mg via ORAL
  Filled 2017-11-17 (×8): qty 1

## 2017-11-17 MED ORDER — PIPERACILLIN-TAZOBACTAM 3.375 G IVPB 30 MIN
3.3750 g | Freq: Once | INTRAVENOUS | Status: AC
  Administered 2017-11-17: 3.375 g via INTRAVENOUS
  Filled 2017-11-17: qty 50

## 2017-11-17 MED ORDER — PSYLLIUM 0.52 G PO CAPS: 0.5200 g | ORAL_CAPSULE | Freq: Every day | ORAL | Status: DC

## 2017-11-17 MED ORDER — VANCOMYCIN HCL IN DEXTROSE 1-5 GM/200ML-% IV SOLN
1000.0000 mg | Freq: Two times a day (BID) | INTRAVENOUS | Status: DC
  Administered 2017-11-18 – 2017-11-19 (×3): 1000 mg via INTRAVENOUS
  Filled 2017-11-17 (×4): qty 200

## 2017-11-17 MED ORDER — ASPIRIN 81 MG PO CHEW
81.0000 mg | CHEWABLE_TABLET | Freq: Every day | ORAL | Status: DC
  Administered 2017-11-18 – 2017-11-25 (×8): 81 mg via ORAL
  Filled 2017-11-17 (×8): qty 1

## 2017-11-17 MED ORDER — CHOLECALCIFEROL 10 MCG (400 UNIT) PO TABS
800.0000 [IU] | ORAL_TABLET | Freq: Every day | ORAL | Status: DC
  Administered 2017-11-18 – 2017-11-25 (×8): 800 [IU] via ORAL
  Filled 2017-11-17 (×8): qty 2

## 2017-11-17 MED ORDER — LATANOPROST 0.005 % OP SOLN
1.0000 [drp] | Freq: Every day | OPHTHALMIC | Status: DC
  Administered 2017-11-17 – 2017-11-24 (×8): 1 [drp] via OPHTHALMIC
  Filled 2017-11-17: qty 2.5

## 2017-11-17 MED ORDER — BISACODYL 10 MG RE SUPP
10.0000 mg | Freq: Every day | RECTAL | Status: DC | PRN
  Administered 2017-11-20: 10 mg via RECTAL
  Filled 2017-11-17: qty 1

## 2017-11-17 MED ORDER — CLONIDINE HCL 0.1 MG PO TABS
0.1000 mg | ORAL_TABLET | Freq: Two times a day (BID) | ORAL | Status: DC
  Administered 2017-11-17 – 2017-11-23 (×12): 0.1 mg via ORAL
  Filled 2017-11-17 (×12): qty 1

## 2017-11-17 MED ORDER — FERROUS SULFATE 325 (65 FE) MG PO TABS
325.0000 mg | ORAL_TABLET | Freq: Every day | ORAL | Status: DC
  Administered 2017-11-18 – 2017-11-25 (×8): 325 mg via ORAL
  Filled 2017-11-17 (×8): qty 1

## 2017-11-17 MED ORDER — METOPROLOL SUCCINATE ER 50 MG PO TB24
50.0000 mg | ORAL_TABLET | Freq: Every day | ORAL | Status: DC
  Administered 2017-11-18: 50 mg via ORAL
  Filled 2017-11-17: qty 1

## 2017-11-17 MED ORDER — PROPYLENE GLYCOL 0.6 % OP SOLN: 1.0000 [drp] | Freq: Two times a day (BID) | OPHTHALMIC | Status: DC

## 2017-11-17 MED ORDER — IOPAMIDOL (ISOVUE-370) INJECTION 76%
100.0000 mL | Freq: Once | INTRAVENOUS | Status: AC | PRN
  Administered 2017-11-17: 100 mL via INTRAVENOUS

## 2017-11-17 MED ORDER — POLYETHYLENE GLYCOL 3350 17 G PO PACK
17.0000 g | PACK | Freq: Every day | ORAL | Status: DC
  Administered 2017-11-18 – 2017-11-25 (×7): 17 g via ORAL
  Filled 2017-11-17 (×7): qty 1

## 2017-11-17 MED ORDER — ONDANSETRON HCL 4 MG/2ML IJ SOLN: 4.0000 mg | Freq: Four times a day (QID) | INTRAMUSCULAR | Status: DC | PRN

## 2017-11-17 MED ORDER — HEPARIN BOLUS VIA INFUSION
4000.0000 [IU] | Freq: Once | INTRAVENOUS | Status: AC
  Administered 2017-11-17: 4000 [IU] via INTRAVENOUS
  Filled 2017-11-17: qty 4000

## 2017-11-17 MED ORDER — ONDANSETRON HCL 4 MG PO TABS: 4.0000 mg | ORAL_TABLET | Freq: Four times a day (QID) | ORAL | Status: DC | PRN

## 2017-11-17 MED ORDER — PIPERACILLIN-TAZOBACTAM 3.375 G IVPB
3.3750 g | Freq: Three times a day (TID) | INTRAVENOUS | Status: DC
  Administered 2017-11-17 – 2017-11-22 (×14): 3.375 g via INTRAVENOUS
  Filled 2017-11-17 (×15): qty 50

## 2017-11-17 MED ORDER — SENNOSIDES-DOCUSATE SODIUM 8.6-50 MG PO TABS: 1.0000 | ORAL_TABLET | Freq: Every evening | ORAL | Status: DC | PRN

## 2017-11-17 MED ORDER — ACETAMINOPHEN 325 MG PO TABS
650.0000 mg | ORAL_TABLET | Freq: Four times a day (QID) | ORAL | Status: DC | PRN
  Administered 2017-11-17 – 2017-11-24 (×2): 650 mg via ORAL
  Filled 2017-11-17 (×2): qty 2

## 2017-11-17 MED ORDER — HEPARIN (PORCINE) IN NACL 100-0.45 UNIT/ML-% IJ SOLN
1400.0000 [IU]/h | INTRAMUSCULAR | Status: DC
  Administered 2017-11-17: 950 [IU]/h via INTRAVENOUS
  Administered 2017-11-18: 1250 [IU]/h via INTRAVENOUS
  Administered 2017-11-19 (×2): 1400 [IU]/h via INTRAVENOUS
  Filled 2017-11-17 (×5): qty 250

## 2017-11-17 MED ORDER — LISINOPRIL 20 MG PO TABS
20.0000 mg | ORAL_TABLET | Freq: Every day | ORAL | Status: DC
  Administered 2017-11-18 – 2017-11-20 (×3): 20 mg via ORAL
  Filled 2017-11-17 (×3): qty 1

## 2017-11-17 MED ORDER — GABAPENTIN 300 MG PO CAPS
300.0000 mg | ORAL_CAPSULE | Freq: Two times a day (BID) | ORAL | Status: DC
  Administered 2017-11-17 – 2017-11-25 (×16): 300 mg via ORAL
  Filled 2017-11-17 (×16): qty 1

## 2017-11-17 MED ORDER — VANCOMYCIN HCL IN DEXTROSE 1-5 GM/200ML-% IV SOLN
1000.0000 mg | Freq: Once | INTRAVENOUS | Status: AC
  Administered 2017-11-17: 1000 mg via INTRAVENOUS
  Filled 2017-11-17: qty 200

## 2017-11-17 MED ORDER — SODIUM CHLORIDE 0.9 % IV SOLN
INTRAVENOUS | Status: DC
  Administered 2017-11-19 (×2): via INTRAVENOUS

## 2017-11-17 MED ORDER — HYDROCODONE-ACETAMINOPHEN 5-325 MG PO TABS
1.0000 | ORAL_TABLET | ORAL | Status: DC | PRN
  Administered 2017-11-19 – 2017-11-23 (×3): 2 via ORAL
  Administered 2017-11-25: 1 via ORAL
  Filled 2017-11-17: qty 2
  Filled 2017-11-17: qty 1
  Filled 2017-11-17 (×2): qty 2

## 2017-11-17 MED ORDER — HYDRALAZINE HCL 20 MG/ML IJ SOLN
10.0000 mg | Freq: Four times a day (QID) | INTRAMUSCULAR | Status: DC | PRN
  Administered 2017-11-17 – 2017-11-25 (×10): 10 mg via INTRAVENOUS
  Filled 2017-11-17 (×12): qty 1

## 2017-11-17 MED ORDER — IOPAMIDOL (ISOVUE-370) INJECTION 76%
INTRAVENOUS | Status: AC
  Filled 2017-11-17: qty 100

## 2017-11-17 MED ORDER — DIPHENHYDRAMINE HCL 25 MG PO CAPS
25.0000 mg | ORAL_CAPSULE | Freq: Three times a day (TID) | ORAL | Status: DC | PRN
  Filled 2017-11-17 (×2): qty 1

## 2017-11-17 MED ORDER — ACETAMINOPHEN 650 MG RE SUPP: 650.0000 mg | Freq: Four times a day (QID) | RECTAL | Status: DC | PRN

## 2017-11-17 MED ORDER — HYPROMELLOSE (GONIOSCOPIC) 2.5 % OP SOLN
1.0000 [drp] | Freq: Two times a day (BID) | OPHTHALMIC | Status: DC
  Administered 2017-11-17 – 2017-11-25 (×16): 1 [drp] via OPHTHALMIC
  Filled 2017-11-17: qty 15

## 2017-11-17 MED ORDER — SODIUM CHLORIDE 0.9 % IV BOLUS
1000.0000 mL | Freq: Once | INTRAVENOUS | Status: AC
  Administered 2017-11-17: 1000 mL via INTRAVENOUS

## 2017-11-17 MED ORDER — BARRIER CREAM NON-SPECIFIED: 1.0000 "application " | TOPICAL_CREAM | TOPICAL | Status: DC

## 2017-11-17 MED ORDER — INSULIN ASPART 100 UNIT/ML ~~LOC~~ SOLN
0.0000 [IU] | Freq: Three times a day (TID) | SUBCUTANEOUS | Status: DC
  Administered 2017-11-18 – 2017-11-20 (×5): 2 [IU] via SUBCUTANEOUS
  Administered 2017-11-20 – 2017-11-21 (×3): 1 [IU] via SUBCUTANEOUS
  Administered 2017-11-21: 2 [IU] via SUBCUTANEOUS
  Administered 2017-11-21: 1 [IU] via SUBCUTANEOUS
  Administered 2017-11-22: 2 [IU] via SUBCUTANEOUS
  Administered 2017-11-22 – 2017-11-23 (×5): 1 [IU] via SUBCUTANEOUS
  Administered 2017-11-24: 2 [IU] via SUBCUTANEOUS
  Administered 2017-11-24: 1 [IU] via SUBCUTANEOUS

## 2017-11-17 NOTE — Progress Notes (Signed)
ANTICOAGULATION CONSULT NOTE  Pharmacy Consult:  Heparin Indication:  Recent PE and DVT  No Known Allergies  Patient Measurements: Height: 67 inches Weight: 75.9 kg Heparin Dosing Weight: 76 kg  Vital Signs: Temp: 99.5 F (37.5 C) (07/04 2233) Temp Source: Oral (07/04 2233) BP: 166/98 (07/04 2233) Pulse Rate: 131 (07/04 2233)  Labs: Recent Labs    11/15/17 0438 11/17/17 1155 11/17/17 1602 11/17/17 1911 11/17/17 2252  HGB 11.5* 11.8*  --  11.9*  --   HCT 36.8 38.1  --  37.4  --   PLT 262 277  --  249  --   APTT  --   --   --  38* 55*  LABPROT  --   --  14.8 15.5*  --   INR  --   --  1.17 1.24  --   HEPARINUNFRC 0.50  --   --   --  0.99*  CREATININE  --  0.92  --   --   --     Estimated Creatinine Clearance: 67.4 mL/min (by C-G formula based on SCr of 0.92 mg/dL).  Assessment: 53 YOF recently diagnosed with right leg DVT and bilateral PE.  She was discharged on Eliquis but has not been receiving med- last dose was on 7/2 on day of discharge.  First heparin level elevated at 0.99units/mL with aPTT low at 55 seconds - this reflects effect of Eliquis on labs and will use aPTT to adjust infusion. No bleeding noted.  Goal of Therapy:  Heparin level 0.3-0.7 units/ml Monitor platelets by anticoagulation protocol: Yes    Plan:  Increase heparin infusion to 1050 units/hr Recheck heparin level and aPTT in 6 hours Daily heparin level and CBC (daily aPTT until labs correlate)  Joseth Weigel D. Colbe Viviano, PharmD, BCPS Clinical Pharmacist [phone] Please check AMION for all [facility] numbers 11/17/2017 11:45 PM

## 2017-11-17 NOTE — ED Provider Notes (Signed)
[facility] EMERGENCY DEPARTMENT Provider Note   CSN: [id number] Arrival date & time: 11/17/17  1142     History   Chief Complaint No chief complaint on file.   HPI ILLENE SWEETING is a 62 y.o. female.  The history is provided by the patient, the nursing home and the EMS personnel. No language interpreter was used.    MERIEM LEMIEUX is a 62 y.o. female who presents to the Emergency Department complaining of tachycardia. She presents to the emergency department for evaluation of elevated heart rate that was noted by facility staff. She was recently diagnosed with a DVT and PE. Patient states she has had rectal pain for the last three days. She denies any chest pain, shortness of breath, nausea, vomiting, abdominal pain. Level V caveat due to confusion. History is provided by the patient, EMS and nursing facility.  According to the nursing facility over the last few days she has had increased weakness as well as abdominal pain, they believe tachycardia began today.     Past Medical History:  Diagnosis Date  . Allergic rhinitis   . Blood dyscrasia   . Dementia   . Depression   . Diabetes mellitus   . Dysmetabolic syndrome X   . Family history of anesthesia complication    brother "  . Hypertension   . Hypokalemia   . Multiple sclerosis (HCC)   . Neuropathic pain   . Urinary incontinence   . Vitamin D deficiency     Patient Active Problem List   Diagnosis Date Noted  . DVT (deep venous thrombosis) Rt Leg 11/14/2017  . Pulmonary embolism (HCC) 11/13/2017  . Pulmonary emboli (HCC) 11/13/2017  . 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 Cerebrovascular disease 01/17/2015  . Encephalopathy, metabolic 12/29/2011  . Pyelonephritis, acute 12/29/2011  . Hypokalemia 12/29/2011  . Depression   . MS (multiple sclerosis) (HCC) 12/28/2011  . Fever 12/28/2011  . DM2 (diabetes mellitus, type 2) (HCC) 12/28/2011    Past Surgical History:    Procedure Laterality Date  . ABDOMINAL HYSTERECTOMY    . CESAREAN SECTION       OB History   None      Home Medications    Prior to Admission medications   Medication Sig Start Date End Date Taking? Authorizing Provider  aspirin 81 MG chewable tablet Chew 81 mg by mouth daily.   Yes [provider]  atorvastatin (LIPITOR) 20 MG tablet Take 1 tablet (20 mg total) by mouth daily. 11/15/17  Yes Emokpae, Courage, MD  barrier cream (NON-SPECIFIED) CREA Apply 1 application topically See admin instructions. Apply 1 application to buttocks after every changing    [provider]  cloNIDine (CATAPRES) 0.1 MG tablet Take 1 tablet (0.1 mg total) by mouth 2 (two) times daily. 11/15/17   Shon Hale, MD  Cranberry 500 MG CAPS Take 500 mg by mouth 2 (two) times daily.    [provider]  diphenhydrAMINE (BENADRYL) 25 MG tablet Take 1 tablet (25 mg total) by mouth every 8 (eight) hours as needed for itching, allergies or sleep. 11/15/17   Shon Hale, MD  ELIQUIS STARTER PACK (ELIQUIS STARTER PACK) 5 MG TABS Take as directed on package: start with two-5mg  tablets twice daily for 7 days. On day 8, switch to one-5mg  tablet twice daily. 11/15/17   Shon Hale, MD  ferrous sulfate 325 (65 FE) MG tablet Take 1 tablet (325 mg total) by mouth daily with breakfast. 11/15/17  Shon Hale, MD  gabapentin (NEURONTIN) 300 MG capsule Take 1 capsule (300 mg total) by mouth every 12 (twelve) hours. 11/15/17   Shon Hale, MD  interferon beta-1a (AVONEX PEN) 30 MCG/0.5ML injection Inject 30 mcg into the muscle every 7 (seven) days.      [provider]  metFORMIN (GLUCOPHAGE) 500 MG tablet Take 1 tablet (500 mg total) by mouth 2 (two) times daily with a meal. For Diabetes 11/15/17   Shon Hale, MD  metoprolol succinate (TOPROL-XL) 50 MG 24 hr tablet Take 1 tablet (50 mg total) by mouth daily. Take with or immediately following a meal. 11/16/17   Emokpae, Courage, MD   ondansetron (ZOFRAN) 4 MG tablet Take 1 tablet (4 mg total) by mouth every 6 (six) hours as needed for nausea. 11/15/17   Shon Hale, MD  polyethylene glycol (MIRALAX / GLYCOLAX) packet Take 17 g by mouth daily. Mix with 8 oz water 11/15/17   Emokpae, Courage, MD  Propylene Glycol (SYSTANE BALANCE) 0.6 % SOLN Place 1 drop into both eyes 2 (two) times daily.    [provider]  senna-docusate (SENOKOT-S) 8.6-50 MG tablet Take 2 tablets by mouth at bedtime. 11/15/17 11/15/18  Shon Hale, MD  zinc oxide 20 % ointment Apply 1 application topically See admin instructions. Apply 1 application after every episode of urinary incontinence/wash first    [provider]    Family History Family History  Problem Relation Age of Onset  . Multiple sclerosis Brother     Social History Social History   Tobacco Use  . Smoking status: Never Smoker  . Smokeless tobacco: Never Used  Substance Use Topics  . Alcohol use: Yes    Alcohol/week: 0.0 oz    Comment: former   . Drug use: No     Allergies   Patient has no known allergies.   Review of Systems Review of Systems  All other systems reviewed and are negative.    Physical Exam Updated Vital Signs BP (!) 181/117   Pulse (!) 131   Temp (!) 101.3 F (38.5 C) (Rectal)   Resp (!) 36   SpO2 99%   Physical Exam  Constitutional: She appears well-developed and well-nourished.  HENT:  Head: Normocephalic and atraumatic.  Cardiovascular: Regular rhythm.  No murmur heard. tachcyardic  Pulmonary/Chest: Effort normal. No respiratory distress.  Decreased air movement in bilateral bases  Abdominal: Soft. There is no tenderness. There is no rebound and no guarding.  Genitourinary:  Genitourinary Comments: Small area of sacral skin breakdown without erythema or edema.    Musculoskeletal: She exhibits no edema or tenderness.  Neurological: She is alert.  Disoriented to place and time.  Generalized weakness, lower  extremities greater than upper. Mildly confused.    Skin: Skin is warm and dry.  Psychiatric: She has a normal mood and affect. Her behavior is normal.  Nursing note and vitals reviewed.    ED Treatments / Results  Labs (all labs ordered are listed, but only abnormal results are displayed) Labs Reviewed  COMPREHENSIVE METABOLIC PANEL - Abnormal; Notable for the following components:      Result Value   Glucose, Bld 221 (*)    Albumin 3.2 (*)    AST 42 (*)    ALT 58 (*)    All other components within normal limits  CBC WITH DIFFERENTIAL/PLATELET - Abnormal; Notable for the following components:   WBC 22.1 (*)    Hemoglobin 11.8 (*)    MCH 25.2 (*)  Neutro Abs 17.4 (*)    Monocytes Absolute 2.5 (*)    Abs Immature Granulocytes 0.4 (*)    All other components within normal limits  I-STAT CG4 LACTIC ACID, ED - Abnormal; Notable for the following components:   Lactic Acid, Venous 3.26 (*)    All other components within normal limits  CULTURE, BLOOD (ROUTINE X 2)  CULTURE, BLOOD (ROUTINE X 2)  URINE CULTURE  URINALYSIS, ROUTINE W REFLEX MICROSCOPIC  PROTIME-INR  I-STAT TROPONIN, ED  I-STAT CG4 LACTIC ACID, ED  I-STAT CG4 LACTIC ACID, ED    EKG EKG Interpretation  Date/Time:  Thursday November 17 2017 11:49:36 EDT Ventricular Rate:  128 PR Interval:    QRS Duration: 72 QT Interval:  323 QTC Calculation: 472 R Axis:   17 Text Interpretation:  Sinus tachycardia Abnormal R-wave progression, early transition LVH with secondary repolarization abnormality No significant change since last tracing Confirmed by Tilden Fossa [phone]) on 11/17/2017 12:02:51 PM   Radiology Ct Abdomen Pelvis Wo Contrast  Result Date: 11/17/2017 CLINICAL DATA:  62 year old female with acute abdominal pain and diarrhea. EXAM: CT ABDOMEN AND PELVIS WITHOUT CONTRAST TECHNIQUE: Multidetector CT imaging of the abdomen and pelvis was performed following the standard protocol without IV contrast. COMPARISON:   11/12/2017 CT FINDINGS: Please note that parenchymal abnormalities may be missed without intravenous contrast. Lower chest: Bibasilar atelectasis again noted. Elevated LEFT hemidiaphragm again noted. Hepatobiliary: The liver and gallbladder are unremarkable. No definite biliary dilatation. Pancreas: Unremarkable Spleen: Unremarkable Adrenals/Urinary Tract: The kidneys, adrenal glands and bladder are unremarkable. Stomach/Bowel: Stomach is within normal limits. No evidence of bowel wall thickening, distention, or inflammatory changes. Vascular/Lymphatic: Aortic atherosclerosis. No enlarged abdominal or pelvic lymph nodes. Reproductive: Status post hysterectomy. No adnexal masses. Other: No ascites, focal collection or pneumoperitoneum. Musculoskeletal: No acute or suspicious bony abnormality. Apex LEFT lumbar scoliosis and moderate degenerative disc disease at L3-4 again identified. IMPRESSION: 1. No evidence of acute abnormality within the abdomen or pelvis. 2. Bibasilar atelectasis again noted 3.  Aortic Atherosclerosis (ICD10-I70.0). Electronically Signed   By: Harmon Pier M.D.   On: 11/17/2017 15:39   Dg Chest Port 1 View  Result Date: 11/17/2017 CLINICAL DATA:  Fever EXAM: PORTABLE CHEST 1 VIEW COMPARISON:  CT chest 11/12/2017.  Chest x-ray 11/12/2017. FINDINGS: 1255 hours. The cardiopericardial silhouette is within normal limits for size. The visualized bony structures of the thorax are intact. Telemetry leads overlie the chest. Fairly prominent gaseous distention of colon visualized in the upper abdomen. Marked asymmetric elevation left hemidiaphragm with adjacent atelectasis in the left lung base. IMPRESSION: Asymmetric elevation left hemidiaphragm with adjacent left base atelectasis. Diffuse gaseous distention of colon within the visualized upper abdomen. Electronically Signed   By: Kennith Center M.D.   On: 11/17/2017 13:07    Procedures Procedures (including critical care time) CRITICAL  CARE Performed by: Tilden Fossa   Total critical care time: 45 minutes  Critical care time was exclusive of separately billable procedures and treating other patients.  Critical care was necessary to treat or prevent imminent or life-threatening deterioration.  Critical care was time spent personally by me on the following activities: development of treatment plan with patient and/or surrogate as well as nursing, discussions with consultants, evaluation of patient's response to treatment, examination of patient, obtaining history from patient or surrogate, ordering and performing treatments and interventions, ordering and review of laboratory studies, ordering and review of radiographic studies, pulse oximetry and re-evaluation of patient's condition.  Medications Ordered in ED Medications  vancomycin (VANCOCIN) IVPB 1000 mg/200 mL premix (has no administration in time range)  piperacillin-tazobactam (ZOSYN) IVPB 3.375 g (has no administration in time range)  sodium chloride 0.9 % bolus 500 mL (0 mLs Intravenous Stopped 11/17/17 1432)  piperacillin-tazobactam (ZOSYN) IVPB 3.375 g (0 g Intravenous Stopped 11/17/17 1352)  vancomycin (VANCOCIN) IVPB 1000 mg/200 mL premix (0 mg Intravenous Stopped 11/17/17 1501)  sodium chloride 0.9 % bolus 1,000 mL (0 mLs Intravenous Stopped 11/17/17 1539)     Initial Impression / Assessment and Plan / ED Course  I have reviewed the triage vital signs and the nursing notes.  Pertinent labs & imaging results that were available during my care of the patient were reviewed by me and considered in my medical decision making (see chart for details).     Patient with history of DVT/PE here for evaluation of tachycardia. She is noted to be tachycardic, tachypnea on ED arrival. She reports rectal pain. On exam there is a small amount of sacral breakdown, no evidence of acute abscess or an infectious process. She did report abdominal pain to the nursing facility although  current exam is nontender. CT abdomen obtained to further evaluate. She is noted to be febrile on ED arrival to 101.3, likely contributing to her tachycardia. She was started on broad-spectrum antibiotics for presumed sepsis. Source of infection not identified today. Plan to admit for ongoing treatment and workup.  She has not been receiving eliquis that was prescribed at hospital discharge.  Will initiate heparin drip due to large untreated PE.   Final Clinical Impressions(s) / ED Diagnoses   Final diagnoses:  None    ED Discharge Orders    None       Tilden Fossa, MD 11/17/17 [phone]

## 2017-11-17 NOTE — ED Triage Notes (Addendum)
Pt arrived via [facility] from a local [facility]st living c/o techycardia as reported by facility staff. Recent Hx of PE/DVT. Tachy today at 130, NS. Hx of MS. Pt seen by RN at facility prior to transport. EMS v/s. Sp02 98%ra, 160/120, 241 cbg, rr 18. Alert and oriented x4. C/o left armpit pain this morning but has since resolved, lasted 5-10 mins. Pt denies pain at time of triage.

## 2017-11-17 NOTE — ED Notes (Signed)
Patient transported to X-ray 

## 2017-11-17 NOTE — Progress Notes (Signed)
Pt admitted to 5 west room 6. Alert to self. Full assessment charted. Call bell within reach. No family at bedside. Will continue to monitor.

## 2017-11-17 NOTE — ED Notes (Signed)
Patient transport delay due to IV placement by IV team.

## 2017-11-17 NOTE — Progress Notes (Addendum)
The Radiologist would like to speak with the patient's primary provider. Provider on call was notified with the contact of the Radiologist number [phone]. Will continue to monitor.

## 2017-11-17 NOTE — ED Notes (Signed)
Phlebotomy at bedside.

## 2017-11-17 NOTE — H&P (Signed)
History and Physical    ZENIYAH PEASTER RUE:[id number] DOB: 10-Nov-1955 DOA: 11/17/2017   PCP: Florentina Jenny, MD   Patient coming from:  Home    Chief Complaint: Tachycardia and abdominal pain    HPI: KIARAH ECKSTEIN is a 62 y.o. female Nursing Home resident with medical history significant for multiple sclerosis bedbound, diabetes, hypertension, hyperlipidemia, and a recent history of PE and DVT, who was supposed to leave on Eliquis, but has not received that medicine while at the nursing home, presenting today with tachycardia the rate of 130, tachypneic her O2 sats were normal at 98% in room air, she was hypertensive, for which she was brought to the ED for further evaluation.  She denies any chest pain, nausea, vomiting, she did complain of abdominal pain, but has been constipated for about 2 or 3 days.  Patient denies any worsening lower extremity swelling, or calf pain.  She has no mobility in her lower extremities.  She denies any dysuria or gross hematuria.  She has a known sacral wound, which appears to be clean.  She denies any tobacco, alcohol or recreational drug use.  No confusion is reported.   ED Course:  BP (!) 181/117   Pulse (!) 131   Temp (!) 101.3 F (38.5 C) (Rectal)   Resp (!) 36   SpO2 99%    glucose was 221. Was 142, potassium 3.7, bicarb 22, BUN 12, creatinine 0.92, calcium 9.1, anion gap 11, alkaline phosphatase 70, albumin 3.2, AST 42, ALT 58, total protein 7.2, W room 0.7 Troponin 0.05 Lactic acid was 3.26, now is 2.88. White count 22.1, hemoglobin 11.8, platelets 277 PT 14.8, INR 1.17 Urine culture is pending Urinalysis pending EKG    Sinus tachycardia Abnormal R-wave progression, early transition LVH with secondary repolarization abnormality No significant change since last tracing Confirmed by Tilden Fossa [phone]) on 11/17/2017 12:02:51 PM CT of the abdomen and pelvis is negative for acute abnormalities. Chest x-ray asymmetric elevation left hemidiaphragm  with adjacent left base atelectasis. Diffuse gaseous distention of colon within the visualized upper abdomen. CT Angio chest  was ordered at the ED, results pending Patient received vancomycin and Zosyn, 2 L of IV fluids, receiving better later. Cultures are pending  Review of Systems:  As per HPI otherwise all other systems reviewed and are negative  Past Medical History:  Diagnosis Date  . Allergic rhinitis   . Blood dyscrasia   . Dementia   . Depression   . Diabetes mellitus   . Dysmetabolic syndrome X   . Family history of anesthesia complication    brother "  . Hypertension   . Hypokalemia   . Multiple sclerosis (HCC)   . Neuropathic pain   . Urinary incontinence   . Vitamin D deficiency     Past Surgical History:  Procedure Laterality Date  . ABDOMINAL HYSTERECTOMY    . CESAREAN SECTION      Social History Social History   Socioeconomic History  . Marital status: Divorced    Spouse name: Not on file  . Number of children: Not on file  . Years of education: Not on file  . Highest education level: Not on file  Occupational History  . Not on file  Social Needs  . Financial resource strain: Not on file  . Food insecurity:    Worry: Not on file    Inability: Not on file  . Transportation needs:    Medical: Not on file    Non-medical:  Not on file  Tobacco Use  . Smoking status: Never Smoker  . Smokeless tobacco: Never Used  Substance and Sexual Activity  . Alcohol use: Yes    Alcohol/week: 0.0 oz    Comment: former   . Drug use: No  . Sexual activity: Never  Lifestyle  . Physical activity:    Days per week: Not on file    Minutes per session: Not on file  . Stress: Not on file  Relationships  . Social connections:    Talks on phone: Not on file    Gets together: Not on file    Attends religious service: Not on file    Active member of club or organization: Not on file    Attends meetings of clubs or organizations: Not on file    Relationship  status: Not on file  . Intimate partner violence:    Fear of current or ex partner: Not on file    Emotionally abused: Not on file    Physically abused: Not on file    Forced sexual activity: Not on file  Other Topics Concern  . Not on file  Social History Narrative  . Not on file     No Known Allergies  Family History  Problem Relation Age of Onset  . Multiple sclerosis Brother        Prior to Admission medications   Medication Sig Start Date End Date Taking? Authorizing Provider  atorvastatin (LIPITOR) 20 MG tablet Take 1 tablet (20 mg total) by mouth daily. 11/15/17   Shon Hale, MD  barrier cream (NON-SPECIFIED) CREA Apply 1 application topically See admin instructions. Apply 1 application to buttocks after every changing    [provider]  cloNIDine (CATAPRES) 0.1 MG tablet Take 1 tablet (0.1 mg total) by mouth 2 (two) times daily. 11/15/17   Shon Hale, MD  Cranberry 500 MG CAPS Take 500 mg by mouth 2 (two) times daily.    [provider]  diphenhydrAMINE (BENADRYL) 25 MG tablet Take 1 tablet (25 mg total) by mouth every 8 (eight) hours as needed for itching, allergies or sleep. 11/15/17   Shon Hale, MD  ELIQUIS STARTER PACK (ELIQUIS STARTER PACK) 5 MG TABS Take as directed on package: start with two-5mg  tablets twice daily for 7 days. On day 8, switch to one-5mg  tablet twice daily. 11/15/17   Shon Hale, MD  ferrous sulfate 325 (65 FE) MG tablet Take 1 tablet (325 mg total) by mouth daily with breakfast. 11/15/17   Mariea Clonts, Courage, MD  gabapentin (NEURONTIN) 300 MG capsule Take 1 capsule (300 mg total) by mouth every 12 (twelve) hours. 11/15/17   Shon Hale, MD  interferon beta-1a (AVONEX PEN) 30 MCG/0.5ML injection Inject 30 mcg into the muscle every 7 (seven) days.      [provider]  metFORMIN (GLUCOPHAGE) 500 MG tablet Take 1 tablet (500 mg total) by mouth 2 (two) times daily with a meal. For Diabetes 11/15/17   Shon Hale, MD  metoprolol succinate (TOPROL-XL) 50 MG 24 hr tablet Take 1 tablet (50 mg total) by mouth daily. Take with or immediately following a meal. 11/16/17   Emokpae, Courage, MD  ondansetron (ZOFRAN) 4 MG tablet Take 1 tablet (4 mg total) by mouth every 6 (six) hours as needed for nausea. 11/15/17   Shon Hale, MD  polyethylene glycol (MIRALAX / GLYCOLAX) packet Take 17 g by mouth daily. Mix with 8 oz water 11/15/17   Shon Hale, MD  Propylene Glycol (  SYSTANE BALANCE) 0.6 % SOLN Place 1 drop into both eyes 2 (two) times daily.    [provider]  senna-docusate (SENOKOT-S) 8.6-50 MG tablet Take 2 tablets by mouth at bedtime. 11/15/17 11/15/18  Shon Hale, MD  zinc oxide 20 % ointment Apply 1 application topically See admin instructions. Apply 1 application after every episode of urinary incontinence/wash first    [provider]     Physical Exam:  Vitals:   11/17/17 1230 11/17/17 1240 11/17/17 1400 11/17/17 1430  BP: (!) 177/109  (!) 183/117 (!) 181/117  Pulse: (!) 130  (!) 129 (!) 131  Resp: (!) 28  (!) 27 (!) 36  Temp:  (!) 101.3 F (38.5 C)    TempSrc:  Rectal    SpO2: 94%  94% 99%   Constitutional: Chronically ill-appearing, alert, able to answer questions but in short sentences. Eyes: PERRL, lids and conjunctivae normal periorbital edema ENMT: Mucous membranes are moist, without exudate or lesions  Neck: normal, supple, no masses, no thyromegaly Respiratory: Decreased breath sounds at the bases, otherwise essentially clear to auscultation bilaterally, no wheezing, no crackles. Normal respiratory effort  Cardiovascular: Regular rate and rhythm,  murmur, rubs or gallops. No extremity edema. 2+ pedal pulses. No carotid bruits.  Abdomen: Soft, non tender, No hepatosplenomegaly. Bowel sounds positive.  Catheter in place Musculoskeletal: no clubbing / cyanosis.  Contractures noted, the patient does not move her extremities. Skin: no jaundice, No lesions.    Neurologic: Sensation intact patient is unable to move her extremities due to multiple sclerosis Psychiatric:   Alert and oriented x 3.  Anxious   Labs on Admission: I have personally reviewed following labs and imaging studies  CBC: Recent Labs  Lab 11/12/17 1825 11/13/17 0356 11/14/17 0527 11/15/17 0438 11/17/17 1155  WBC 12.1* 11.6* 13.2* 10.3 22.1*  NEUTROABS  --   --   --   --  17.4*  HGB 12.7 12.6 12.4 11.5* 11.8*  HCT 42.0 41.2 39.8 36.8 38.1  MCV 82.8 82.1 81.2 79.8 81.4  PLT 255 236 234 262 277    Basic Metabolic Panel: Recent Labs  Lab 11/12/17 1825 11/13/17 0356 11/14/17 0527 11/17/17 1155  NA 151* 149* 139 142  K 3.7 3.7 3.3* 3.7  CL 114* 113* 108 109  CO2 26 25 20* 22  GLUCOSE 153* 161* 125* 221*  BUN 19 15 13 12   CREATININE 0.79 0.75 0.64 0.92  CALCIUM 9.4 9.1 8.4* 9.1    GFR: Estimated Creatinine Clearance: 67.4 mL/min (by C-G formula based on SCr of 0.92 mg/dL).  Liver Function Tests: Recent Labs  Lab 11/13/17 0356 11/17/17 1155  AST 13* 42*  ALT 14 58*  ALKPHOS 70 70  BILITOT 0.7 0.7  PROT 7.1 7.2  ALBUMIN 3.1* 3.2*   No results for input(s): LIPASE, AMYLASE in the last 168 hours. No results for input(s): AMMONIA in the last 168 hours.  Coagulation Profile: Recent Labs  Lab 11/17/17 1602  INR 1.17    Cardiac Enzymes: Recent Labs  Lab 11/12/17 2218  TROPONINI <0.03    BNP (last 3 results) No results for input(s): PROBNP in the last 8760 hours.  HbA1C: No results for input(s): HGBA1C in the last 72 hours.  CBG: Recent Labs  Lab 11/14/17 2235 11/15/17 0806 11/15/17 1240 11/15/17 1629 11/15/17 2116  GLUCAP 154* 129* 124* 163* 159*    Lipid Profile: No results for input(s): CHOL, HDL, LDLCALC, TRIG, CHOLHDL, LDLDIRECT in the last 72 hours.  Thyroid Function  Tests: No results for input(s): TSH, T4TOTAL, FREET4, T3FREE, THYROIDAB in the last 72 hours.  Anemia Panel: No results for input(s): VITAMINB12, FOLATE,  FERRITIN, TIBC, IRON, RETICCTPCT in the last 72 hours.  Urine analysis:    Component Value Date/Time   COLORURINE YELLOW 11/12/2017 2135   APPEARANCEUR CLEAR 11/12/2017 2135   LABSPEC 1.017 11/12/2017 2135   PHURINE 5.0 11/12/2017 2135   GLUCOSEU NEGATIVE 11/12/2017 2135   HGBUR NEGATIVE 11/12/2017 2135   BILIRUBINUR NEGATIVE 11/12/2017 2135   KETONESUR NEGATIVE 11/12/2017 2135   PROTEINUR NEGATIVE 11/12/2017 2135   UROBILINOGEN 1.0 12/28/2011 1634   NITRITE NEGATIVE 11/12/2017 2135   LEUKOCYTESUR TRACE (A) 11/12/2017 2135    Sepsis Labs: @LABRCNTIP (procalcitonin:4,lacticidven:4) ) Recent Results (from the past 240 hour(s))  MRSA PCR Screening     Status: None   Collection Time: 11/13/17  2:53 PM  Result Value Ref Range Status   MRSA by PCR NEGATIVE NEGATIVE Final    Comment:        The GeneXpert MRSA Assay (FDA approved for NASAL specimens only), is one component of a comprehensive MRSA colonization surveillance program. It is not intended to diagnose MRSA infection nor to guide or monitor treatment for MRSA infections. Performed at [facility], [address], [place], [place] [address]      Radiological Exams on Admission: Ct Abdomen Pelvis Wo Contrast  Result Date: 11/17/2017 CLINICAL DATA:  62 year old female with acute abdominal pain and diarrhea. EXAM: CT ABDOMEN AND PELVIS WITHOUT CONTRAST TECHNIQUE: Multidetector CT imaging of the abdomen and pelvis was performed following the standard protocol without IV contrast. COMPARISON:  11/12/2017 CT FINDINGS: Please note that parenchymal abnormalities may be missed without intravenous contrast. Lower chest: Bibasilar atelectasis again noted. Elevated LEFT hemidiaphragm again noted. Hepatobiliary: The liver and gallbladder are unremarkable. No definite biliary dilatation. Pancreas: Unremarkable Spleen: Unremarkable Adrenals/Urinary Tract: The kidneys, adrenal glands and bladder are unremarkable. Stomach/Bowel:  Stomach is within normal limits. No evidence of bowel wall thickening, distention, or inflammatory changes. Vascular/Lymphatic: Aortic atherosclerosis. No enlarged abdominal or pelvic lymph nodes. Reproductive: Status post hysterectomy. No adnexal masses. Other: No ascites, focal collection or pneumoperitoneum. Musculoskeletal: No acute or suspicious bony abnormality. Apex LEFT lumbar scoliosis and moderate degenerative disc disease at L3-4 again identified. IMPRESSION: 1. No evidence of acute abnormality within the abdomen or pelvis. 2. Bibasilar atelectasis again noted 3.  Aortic Atherosclerosis (ICD10-I70.0). Electronically Signed   By: Harmon Pier M.D.   On: 11/17/2017 15:39   Dg Chest Port 1 View  Result Date: 11/17/2017 CLINICAL DATA:  Fever EXAM: PORTABLE CHEST 1 VIEW COMPARISON:  CT chest 11/12/2017.  Chest x-ray 11/12/2017. FINDINGS: 1255 hours. The cardiopericardial silhouette is within normal limits for size. The visualized bony structures of the thorax are intact. Telemetry leads overlie the chest. Fairly prominent gaseous distention of colon visualized in the upper abdomen. Marked asymmetric elevation left hemidiaphragm with adjacent atelectasis in the left lung base. IMPRESSION: Asymmetric elevation left hemidiaphragm with adjacent left base atelectasis. Diffuse gaseous distention of colon within the visualized upper abdomen. Electronically Signed   By: Kennith Center M.D.   On: 11/17/2017 13:07    EKG: Independently reviewed.  Assessment/Plan Active Problems:   MS (multiple sclerosis) (HCC)   Fever   DM2 (diabetes mellitus, type 2) (HCC)   Encephalopathy, metabolic   Depression   Pyelonephritis, acute   Pulmonary embolism (HCC)   DVT (deep venous thrombosis) Rt Leg   Leukocytosis   Sepsis (HCC)   Tachycardia,  in the setting of presumed sepsis (see below), versus other etiology such as PE, and dehydration; recent history of PE and DVT the patient was not on Eliquis upon discharge on  11/15/2017.  CT angios chest is pending.EKG    Sinus tachycardia Abnormal R-wave progression, early transition LVH with secondary repolarization abnormality No significant change since last tracing 0.05.  White count elevated In the interim, due to presumed sepsis, will continue with vancomycin and Zosyn, and await the results of the CT angiogram report.  Presumed sepsis likely due to urine source QSOFA score 1  Patient meets criteria given tachycardia, tachypnea, fever 3, leukocytosis, and evidence of organ dysfunction.  Lactic acid was 3.6, with repeat 2.88.  Antibiotics delivered in the ED Comycin and Zosyn. UA pending White count is 22  patient's ABG notable for she received 2 L of IV fluids tonight Admit to SDU Sepsis order set   For now, continue IV antibiotics with Vanco and Zosyn, until results of the CT angiogram of the chest, rule out PE DuoNebs (instead of atrovent unless serious tachy) Serial lactic acid IV fluids at 100 cc/h.   Procalcitonin order set   History of massive pulmonary embolism, with right lower extremity acute DVT of the popliteal femoral and distal common femoral vein, echo recently showed EF 65 to 70%, grade 1 diastolic dysfunction.  No history of cancer. CT angios of the chest is pending, as mentioned above Continue heparin drip per pharmacy, will need to resume Eliquis per pharmacy Strict bedrest  Type II Diabetes Current blood sugar level is 221 Lab Results  Component Value Date   HGBA1C 6.1 (H) 12/29/2011  Hgb A1C SSI    Hypertension BP   181/117   Pulse   131    Continue home anti-hypertensive medications  Add Hydralazine Q6 hours as needed for BP 160/90   Hyperlipidemia Continue home statins   Anemia of chronic disease, Hb 11.8 , stable COntinue Iron supplements CBC in am    History of MS, with neuromuscular deficits, the patient does interferon beta injections 30 mcg q. 7 days, supportive care, home health nursing and physical  therapy Follow up with neurology as outpatient  Wound sacral decubitus ulcer, wound care per nursing   DVT prophylaxis:  Heparin  Code Status:   Full Code  Family Communication:  Discussed with patient Disposition Plan: Expect patient to be discharged to home after condition improves Consults called:    None Admission status: Stepdown telemetry inpatient   Marlowe Kays, PA-C [facility]   Amion text  [phone]   11/17/2017, 5:36 PM

## 2017-11-17 NOTE — ED Notes (Signed)
Pt. bladder scanned, 321 ml identified.

## 2017-11-17 NOTE — ED Notes (Signed)
Pt returned from CT. CT team reported IV infiltration. Hardening above site noted. Pt not complaining of pain at this time. No reddening of skin noted. EDP notified.

## 2017-11-17 NOTE — Progress Notes (Signed)
ANTICOAGULATION CONSULT NOTE - Initial Consult  Pharmacy Consult:  Heparin Indication:  Recent PE and DVT  No Known Allergies  Patient Measurements: Height = 67 inches Weight = 75.9 kg Heparin Dosing Weight: 76 kg  Vital Signs: Temp: 101.3 F (38.5 C) (07/04 1240) Temp Source: Rectal (07/04 1240) BP: 181/117 (07/04 1430) Pulse Rate: 131 (07/04 1430)  Labs: Recent Labs    11/15/17 0438 11/17/17 1155  HGB 11.5* 11.8*  HCT 36.8 38.1  PLT 262 277  HEPARINUNFRC 0.50  --   CREATININE  --  0.92    Estimated Creatinine Clearance: 67.4 mL/min (by C-G formula based on SCr of 0.92 mg/dL).   Medical History: Past Medical History:  Diagnosis Date  . Allergic rhinitis   . Blood dyscrasia   . Dementia   . Depression   . Diabetes mellitus   . Dysmetabolic syndrome X   . Family history of anesthesia complication    brother "  . Hypertension   . Hypokalemia   . Multiple sclerosis (HCC)   . Neuropathic pain   . Urinary incontinence   . Vitamin D deficiency       Assessment: 36 YOF recently diagnosed with right leg DVT and bilateral PE.  She was discharged on Eliquis but has not been receiving med.  Patient presented today 11/17/17 with tachycardia and stared on antibiotics for sepsis.  Pharmacy also consulted to start IV heparin for untreated PE/DVT.  Baseline labs reviewed.   Goal of Therapy:  Heparin level 0.3-0.7 units/ml Monitor platelets by anticoagulation protocol: Yes    Plan:  Heparin 4000 units IV bolus, then Heparin gtt at 950 units/hr based on previous trend Check 6 hr heparin level and aPTT Daily heparin level and CBC (add daily aPTT if needed)   [name] D. Laney Potash, PharmD, BCPS, BCCCP 11/17/2017, 4:12 PM

## 2017-11-18 DIAGNOSIS — I2699 Other pulmonary embolism without acute cor pulmonale: Secondary | ICD-10-CM

## 2017-11-18 DIAGNOSIS — R6510 Systemic inflammatory response syndrome (SIRS) of non-infectious origin without acute organ dysfunction: Secondary | ICD-10-CM

## 2017-11-18 LAB — CBC
HCT: 36.4 % (ref 36.0–46.0)
Hemoglobin: 11.5 g/dL — ABNORMAL LOW (ref 12.0–15.0)
MCH: 25.3 pg — ABNORMAL LOW (ref 26.0–34.0)
MCHC: 31.6 g/dL (ref 30.0–36.0)
MCV: 80 fL (ref 78.0–100.0)
PLATELETS: 240 10*3/uL (ref 150–400)
RBC: 4.55 MIL/uL (ref 3.87–5.11)
RDW: 15 % (ref 11.5–15.5)
WBC: 19.6 10*3/uL — AB (ref 4.0–10.5)

## 2017-11-18 LAB — BASIC METABOLIC PANEL
Anion gap: 11 (ref 5–15)
BUN: 9 mg/dL (ref 8–23)
CHLORIDE: 108 mmol/L (ref 98–111)
CO2: 24 mmol/L (ref 22–32)
CREATININE: 0.74 mg/dL (ref 0.44–1.00)
Calcium: 8.9 mg/dL (ref 8.9–10.3)
GFR calc Af Amer: >60 mL/min (ref >60)
GFR calc non Af Amer: >60 mL/min (ref >60)
Glucose, Bld: 160 mg/dL — ABNORMAL HIGH (ref 70–99)
Potassium: 3.5 mmol/L (ref 3.5–5.1)
SODIUM: 143 mmol/L (ref 135–145)

## 2017-11-18 LAB — GLUCOSE, CAPILLARY
Glucose-Capillary: 168 mg/dL — ABNORMAL HIGH (ref 70–99)
Glucose-Capillary: 154 mg/dL — ABNORMAL HIGH (ref 70–99)
Glucose-Capillary: 188 mg/dL — ABNORMAL HIGH (ref 70–99)
Glucose-Capillary: 175 mg/dL — ABNORMAL HIGH (ref 70–99)

## 2017-11-18 LAB — APTT
APTT: 41 s — AB (ref 24–36)
aPTT: 39 seconds — ABNORMAL HIGH (ref 24–36)

## 2017-11-18 LAB — HEPARIN LEVEL (UNFRACTIONATED)
HEPARIN UNFRACTIONATED: 0.75 [IU]/mL — AB (ref 0.30–0.70)
Heparin Unfractionated: 0.51 IU/mL (ref 0.30–0.70)

## 2017-11-18 LAB — PROCALCITONIN: PROCALCITONIN: 0.28 ng/mL

## 2017-11-18 MED ORDER — METOPROLOL SUCCINATE ER 50 MG PO TB24
75.0000 mg | ORAL_TABLET | Freq: Every day | ORAL | Status: DC
  Administered 2017-11-19 – 2017-11-23 (×5): 75 mg via ORAL
  Filled 2017-11-18 (×5): qty 1

## 2017-11-18 MED ORDER — ORAL CARE MOUTH RINSE
15.0000 mL | Freq: Two times a day (BID) | OROMUCOSAL | Status: DC
  Administered 2017-11-19 – 2017-11-25 (×13): 15 mL via OROMUCOSAL

## 2017-11-18 MED ORDER — METOPROLOL SUCCINATE ER 25 MG PO TB24
25.0000 mg | ORAL_TABLET | Freq: Once | ORAL | Status: AC
  Administered 2017-11-18: 25 mg via ORAL
  Filled 2017-11-18: qty 1

## 2017-11-18 NOTE — Progress Notes (Signed)
PROGRESS NOTE                                                                                                                                                                                                             Patient Demographics:    [name], is a 62 y.o. female, DOB - 1956/01/06, DHW:[id number]  Admit date - 11/17/2017   Admitting Physician Karmen Bongo, MD  Outpatient Primary MD for the patient is Reymundo Poll, MD  LOS - 1   No chief complaint on file.      Brief Narrative    62 y.o. female with medical history significant for multiple sclerosis bedbound, diabetes, hypertension, hyperlipidemia, and a recent hospitalization  for PE and DVT, she was discharged on 11/15/2017 on Eliquis to ALF, unclear at this point if she was taking her Eliquis or not, she presents secondary to tachycardia, she was needed noted to have SIRS, repeat CTA chest showing with increased clot burden .   Subjective:    [name] today denies any complaints .   Assessment  & Plan :    Active Problems:   MS (multiple sclerosis) (HCC)   Fever   DM2 (diabetes mellitus, type 2) (HCC)   Encephalopathy, metabolic   Depression   Pyelonephritis, acute   Pulmonary embolism (HCC)   DVT (deep venous thrombosis) Rt Leg   Leukocytosis   Sepsis (HCC)  SIRS -SIRS criteria met on admission, leukocytosis, fever, tachycardia and tachypnea, and elevated lactic acid, so far there is no clear infectious etiology, CT chest abdomen pelvis with no evidence of infectious etiology, she has negative urine analysis, blood cultures remain negative, and given how unstable she will continue with empiric antibiotic coverage today, especially with her leukocytosis and fever, will follow pro calcitonin.  Sinus tachycardia -Patient with significant tachycardia, actually by reviewing her recent discharge she was tachycardic as well in the 110s, but not that high, this may be worsening  in the setting of infection or PE. -Blood pressure controlled, I will increase her Toprol-XL which should help with her tachycardia as well, continue with telemetry monitoring  PE -Patient with recent diagnosis of PE, she was discharged on Eliquis, she was discharged to an assisted living facility, at this point unclear if she was taking it or not, giving her tachycardia, and how symptomatic that she  will continue with IV heparin for now, I have discussed with case manager who will discuss with assisted-living to see if she was taking her Eliquis or not.  Type 2 diabetes mellitus -Continue with insulin sliding scale   Hypertension -Blood pressure significantly uncontrolled despite being resume on home regimen, continue with PRN hydralazine, I will increase her Toprol-XL from 50-75 daily this should help with both blood pressure and tachycardia.   Hyperlipidemia Continue home statins   Anemia of chronic disease,  -Continue to monitor H&H closely as she is on anticoagulation   History of MS,  - with neuromuscular deficits, the patient does interferon beta injections 30 mcg q. 7 days, supportive care, home health nursing and physical therapy Follow up with neurology as outpatient       Code Status : DNR  Family Communication  : none at bedside  Disposition Plan  : Alf when stable  Consults  :  none  Procedures  : none  DVT Prophylaxis  :  On heparin GTT  Lab Results  Component Value Date   PLT 240 11/18/2017    Antibiotics  :    Anti-infectives (From admission, onward)   Start     Dose/Rate Route Frequency Ordered Stop   11/18/17 0200  vancomycin (VANCOCIN) IVPB 1000 mg/200 mL premix     1,000 mg 200 mL/hr over 60 Minutes Intravenous Every 12 hours 11/17/17 1429     11/17/17 2200  piperacillin-tazobactam (ZOSYN) IVPB 3.375 g     3.375 g 12.5 mL/hr over 240 Minutes Intravenous Every 8 hours 11/17/17 1429     11/17/17 1300  piperacillin-tazobactam (ZOSYN)  IVPB 3.375 g     3.375 g 100 mL/hr over 30 Minutes Intravenous  Once 11/17/17 1255 11/17/17 1352   11/17/17 1300  vancomycin (VANCOCIN) IVPB 1000 mg/200 mL premix     1,000 mg 200 mL/hr over 60 Minutes Intravenous  Once 11/17/17 1255 11/17/17 1501        Objective:   Vitals:   11/18/17 0912 11/18/17 1216 11/18/17 1421 11/18/17 1422  BP: (!) 137/96 (!) 149/90 (!) 166/99   Pulse: (!) 125 (!) 129 (!) 121   Resp: (!) 23 (!) 28 (!) 28   Temp:    (!) 100.8 F (38.2 C)  TempSrc:    Oral  SpO2: 94% 95% 93%   Weight:        Wt Readings from Last 3 Encounters:  11/18/17 74.3 kg (163 lb 12.8 oz)  11/13/17 75.9 kg (167 lb 5.3 oz)  01/21/17 78.9 kg (174 lb)     Intake/Output Summary (Last 24 hours) at 11/18/2017 1447 Last data filed at 11/18/2017 0900 Gross per 24 hour  Intake 619.89 ml  Output -  Net 619.89 ml     Physical Exam  Awake Alert, frail elderly female, laying in bed in no apparent distress Symmetrical Chest wall movement, Good air movement bilaterally, CTAB Tachycardic,No Gallops,Rubs or new Murmurs, No Parasternal Heave +ve B.Sounds, Abd Soft, No tenderness, , No rebound - guarding or rigidity. No Cyanosis, Clubbing or edema, No new Rash or bruise      Data Review:    CBC Recent Labs  Lab 11/14/17 0527 11/15/17 0438 11/17/17 1155 11/17/17 1911 11/18/17 0731  WBC 13.2* 10.3 22.1* 21.8* 19.6*  HGB 12.4 11.5* 11.8* 11.9* 11.5*  HCT 39.8 36.8 38.1 37.4 36.4  PLT 234 262 277 249 240  MCV 81.2 79.8 81.4 79.9 80.0  MCH 25.3* 24.9* 25.2* 25.4* 25.3*  MCHC  31.2 31.3 31.0 31.8 31.6  RDW 14.6 14.5 15.1 15.0 15.0  LYMPHSABS  --   --  1.7 1.8  --   MONOABS  --   --  2.5* 2.4*  --   EOSABS  --   --  0.0 0.0  --   BASOSABS  --   --  0.1 0.1  --     Chemistries  Recent Labs  Lab 11/12/17 1825 11/13/17 0356 11/14/17 0527 11/17/17 1155 11/18/17 0731  NA 151* 149* 139 142 143  K 3.7 3.7 3.3* 3.7 3.5  CL 114* 113* 108 109 108  CO2 26 25 20* 22 24  GLUCOSE  153* 161* 125* 221* 160*  BUN '19 15 13 12 9  ' CREATININE 0.79 0.75 0.64 0.92 0.74  CALCIUM 9.4 9.1 8.4* 9.1 8.9  AST  --  13*  --  42*  --   ALT  --  14  --  58*  --   ALKPHOS  --  70  --  70  --   BILITOT  --  0.7  --  0.7  --    ------------------------------------------------------------------------------------------------------------------ No results for input(s): CHOL, HDL, LDLCALC, TRIG, CHOLHDL, LDLDIRECT in the last 72 hours.  Lab Results  Component Value Date   HGBA1C 6.1 (H) 12/29/2011   ------------------------------------------------------------------------------------------------------------------ No results for input(s): TSH, T4TOTAL, T3FREE, THYROIDAB in the last 72 hours.  Invalid input(s): FREET3 ------------------------------------------------------------------------------------------------------------------ No results for input(s): VITAMINB12, FOLATE, FERRITIN, TIBC, IRON, RETICCTPCT in the last 72 hours.  Coagulation profile Recent Labs  Lab 11/17/17 1602 11/17/17 1911  INR 1.17 1.24    No results for input(s): DDIMER in the last 72 hours.  Cardiac Enzymes Recent Labs  Lab 11/12/17 2218  TROPONINI <0.03   ------------------------------------------------------------------------------------------------------------------ No results found for: BNP  Inpatient Medications  Scheduled Meds: . aspirin  81 mg Oral Daily  . atorvastatin  20 mg Oral q1800  . cholecalciferol  800 Units Oral Daily  . cloNIDine  0.1 mg Oral BID  . ferrous sulfate  325 mg Oral Q breakfast  . gabapentin  300 mg Oral Q12H  . hydroxypropyl methylcellulose / hypromellose  1 drop Both Eyes BID  . insulin aspart  0-9 Units Subcutaneous TID WC  . latanoprost  1 drop Both Eyes QHS  . lisinopril  20 mg Oral Daily  . metoprolol succinate  25 mg Oral Once  . [START ON 11/19/2017] metoprolol succinate  75 mg Oral Daily  . polyethylene glycol  17 g Oral Daily   Continuous Infusions: .  sodium chloride    . heparin 1,250 Units/hr (11/18/17 0950)  . piperacillin-tazobactam (ZOSYN)  IV 3.375 g (11/18/17 0841)  . vancomycin 1,000 mg (11/18/17 1406)   PRN Meds:.acetaminophen **OR** acetaminophen, bisacodyl, diphenhydrAMINE, hydrALAZINE, HYDROcodone-acetaminophen, ondansetron **OR** ondansetron (ZOFRAN) IV, senna-docusate, zinc oxide  Micro Results Recent Results (from the past 240 hour(s))  MRSA PCR Screening     Status: None   Collection Time: 11/13/17  2:53 PM  Result Value Ref Range Status   MRSA by PCR NEGATIVE NEGATIVE Final    Comment:        The GeneXpert MRSA Assay (FDA approved for NASAL specimens only), is one component of a comprehensive MRSA colonization surveillance program. It is not intended to diagnose MRSA infection nor to guide or monitor treatment for MRSA infections. Performed at [facility], [place] [address], [place], [place] [address]   Blood Culture (routine x 2)     Status: None (Preliminary  result)   Collection Time: 11/17/17 12:33 PM  Result Value Ref Range Status   Specimen Description BLOOD LEFT ANTECUBITAL  Final   Special Requests   Final    BOTTLES DRAWN AEROBIC AND ANAEROBIC Blood Culture results may not be optimal due to an inadequate volume of blood received in culture bottles   Culture   Final    NO GROWTH < 24 HOURS Performed at [facility] [address] [place], [place] [address]    Report Status PENDING  Incomplete  Blood Culture (routine x 2)     Status: None (Preliminary result)   Collection Time: 11/17/17  1:16 PM  Result Value Ref Range Status   Specimen Description BLOOD RIGHT HAND  Final   Special Requests   Final    BOTTLES DRAWN AEROBIC AND ANAEROBIC Blood Culture adequate volume   Culture   Final    NO GROWTH < 24 HOURS Performed at [facility], [place] [address], [place], [place] [address]    Report Status PENDING  Incomplete  Culture, blood (x 2)     Status: None (Preliminary result)    Collection Time: 11/17/17  4:16 PM  Result Value Ref Range Status   Specimen Description BLOOD LEFT ANTECUBITAL  Final   Special Requests   Final    BOTTLES DRAWN AEROBIC AND ANAEROBIC Blood Culture results may not be optimal due to an inadequate volume of blood received in culture bottles   Culture   Final    NO GROWTH < 24 HOURS Performed at [facility], [place] [address] [place], [place] [address]    Report Status PENDING  Incomplete  Culture, blood (x 2)     Status: None (Preliminary result)   Collection Time: 11/17/17  4:21 PM  Result Value Ref Range Status   Specimen Description BLOOD RIGHT HAND  Final   Special Requests   Final    BOTTLES DRAWN AEROBIC AND ANAEROBIC Blood Culture adequate volume   Culture   Final    NO GROWTH < 24 HOURS Performed at [facility], [place] [address], [place], [place] [address]    Report Status PENDING  Incomplete    Radiology Reports Ct Abdomen Pelvis Wo Contrast  Result Date: 11/17/2017 CLINICAL DATA:  62 year old female with acute abdominal pain and diarrhea. EXAM: CT ABDOMEN AND PELVIS WITHOUT CONTRAST TECHNIQUE: Multidetector CT imaging of the abdomen and pelvis was performed following the standard protocol without IV contrast. COMPARISON:  11/12/2017 CT FINDINGS: Please note that parenchymal abnormalities may be missed without intravenous contrast. Lower chest: Bibasilar atelectasis again noted. Elevated LEFT hemidiaphragm again noted. Hepatobiliary: The liver and gallbladder are unremarkable. No definite biliary dilatation. Pancreas: Unremarkable Spleen: Unremarkable Adrenals/Urinary Tract: The kidneys, adrenal glands and bladder are unremarkable. Stomach/Bowel: Stomach is within normal limits. No evidence of bowel wall thickening, distention, or inflammatory changes. Vascular/Lymphatic: Aortic atherosclerosis. No enlarged abdominal or pelvic lymph nodes. Reproductive: Status post hysterectomy. No adnexal masses. Other: No ascites, focal  collection or pneumoperitoneum. Musculoskeletal: No acute or suspicious bony abnormality. Apex LEFT lumbar scoliosis and moderate degenerative disc disease at L3-4 again identified. IMPRESSION: 1. No evidence of acute abnormality within the abdomen or pelvis. 2. Bibasilar atelectasis again noted 3.  Aortic Atherosclerosis (ICD10-I70.0). Electronically Signed   By: Margarette Canada M.D.   On: 11/17/2017 15:39   Dg Chest 2 View  Result Date: 11/12/2017 CLINICAL DATA:  Arm pain. EXAM: CHEST - 2 VIEW COMPARISON:  Chest x-ray  dated December 28, 2011. FINDINGS: The heart size and mediastinal contours are within normal limits. Normal pulmonary vascularity. No focal consolidation, pleural effusion, or pneumothorax. Unchanged elevation of the left hemidiaphragm. No acute osseous abnormality. IMPRESSION: No active cardiopulmonary disease. Electronically Signed   By: Titus Dubin M.D.   On: 11/12/2017 19:56   Ct Angio Chest Pe W Or Wo Contrast  Result Date: 11/17/2017 CLINICAL DATA:  Tachycardia.  Recent PE/DVT. EXAM: CT ANGIOGRAPHY CHEST WITH CONTRAST TECHNIQUE: Multidetector CT imaging of the chest was performed using the standard protocol during bolus administration of intravenous contrast. Multiplanar CT image reconstructions and MIPs were obtained to evaluate the vascular anatomy. CONTRAST:  154m ISOVUE-370 IOPAMIDOL (ISOVUE-370) INJECTION 76% COMPARISON:  11/12/2016 FINDINGS: Cardiovascular: Heart is normal in size. Calcified plaque over the left anterior descending and right coronary arteries. Mild calcified plaque over the thoracic aorta. Pulmonary arterial system is well opacified demonstrates no significant change in moderate emboli over the right lower lobar pulmonary arteries with slight improvement in the proximal right upper lobar arteries and emboli over the right middle lobar pulmonary artery. New pulmonary emboli over the left upper lobe and proximal left lower lobar pulmonary arteries. RV/LV ratio is  33.1/35.4 is 0.93. Mediastinum/Nodes: No mediastinal or hilar adenopathy. Remaining mediastinal structures are within normal. Lungs/Pleura: Elevated left hemidiaphragm with slight worsening opacification left base likely atelectasis. Right lung is clear. Airways are normal. Upper Abdomen: No acute findings. Calcified plaque over the abdominal aorta. Musculoskeletal: Unchanged. Review of the MIP images confirms the above findings. IMPRESSION: Known right-sided pulmonary emboli without significant overall change and new pulmonary emboli over the proximal right upper and lower lobar pulmonary arteries. Positive for acute PE with CT evidence of right heart strain (RV/LV Ratio = 0.93) consistent with at least submassive (intermediate risk) PE. The presence of right heart strain has been associated with an increased risk of morbidity and mortality. Please activate Code PE by paging [phone] Slight worsening opacification left base likely atelectasis. Atherosclerotic coronary artery disease. Aortic Atherosclerosis (ICD10-I70.0). These results will be called to the ordering clinician or representative by the Radiologist Assistant, and communication documented in the PACS or zVision Dashboard. Electronically Signed   By: DMarin OlpM.D.   On: 11/17/2017 22:15   Ct Angio Chest Pe W/cm &/or Wo Cm  Result Date: 11/12/2017 CLINICAL DATA:  Right cheek and arm cramping. Chest pain and tachycardia. EXAM: CT ANGIOGRAPHY CHEST CT ABDOMEN AND PELVIS WITH CONTRAST TECHNIQUE: Multidetector CT imaging of the chest was performed using the standard protocol during bolus administration of intravenous contrast. Multiplanar CT image reconstructions and MIPs were obtained to evaluate the vascular anatomy. Multidetector CT imaging of the abdomen and pelvis was performed using the standard protocol during bolus administration of intravenous contrast. CONTRAST:  1087mISOVUE-300 IOPAMIDOL (ISOVUE-300) INJECTION 61% COMPARISON:  CXR  11/12/2017 FINDINGS: CTA CHEST FINDINGS Cardiovascular: Acute pulmonary emboli at the branch point of the right main pulmonary embolus extending into the right lobar level in the upper lobe and segmental level to the right lower lobe. RV/LV ratio elevated at 0.79. Left ventricular hypertrophy is seen. No pericardial effusion. No aortic aneurysm or dissection. Mild ectasia of the ascending aorta to 3.7 cm. Common branch point of the right brachiocephalic and left common carotid arteries without stenosis of the great vessels. Coronary arteriosclerosis is noted along the LAD. Mediastinum/Nodes: No mediastinal nor hilar adenopathy. Trachea is unremarkable. Mainstem bronchi are patent. Lungs/Pleura: Dependent atelectasis. No acute pulmonary abnormality. No effusion or pneumothorax. No dominant  mass. Elevated left hemidiaphragm. Musculoskeletal: No chest wall abnormality. No acute or significant osseous findings. Review of the MIP images confirms the above findings. CT ABDOMEN and PELVIS FINDINGS Hepatobiliary: No focal liver abnormality is seen. No gallstones, gallbladder wall thickening, or biliary dilatation. Pancreas: Unremarkable. No pancreatic ductal dilatation or surrounding inflammatory changes. Spleen: Normal size spleen without mass. Punctate calcification consistent with a tiny granuloma is noted within. Adrenals/Urinary Tract: Normal bilateral adrenal glands. Symmetric cortical enhancement of kidneys without enhancing mass lesions. No nephrolithiasis nor hydroureteronephrosis. The urinary bladder is unremarkable for the degree of distention. Stomach/Bowel: Small hiatal hernia. Decompressed stomach with normal small bowel rotation. No bowel obstruction or inflammation. The terminal and distal ileum are normal. The appendix is not well visualized but no pericecal inflammatory change is seen. Moderate stool retention is noted within colon. Vascular/Lymphatic: Moderate aortoiliac atherosclerosis without aneurysm.  No adenopathy. Reproductive: Hysterectomy.  No adnexal mass. Other: Tiny fat containing umbilical hernia. No free air nor free fluid. Musculoskeletal: Degenerative disc disease L3-4. No acute osseous abnormality. Review of the MIP images confirms the above findings. IMPRESSION: Chest CT: 1. Positive for acute PE to the right upper and lower lobes with CT evidence of right heart strain (RV/LV Ratio = 0.78) consistent with at least submassive (intermediate risk) PE. The presence of right heart strain has been associated with an increased risk of morbidity and mortality. Please activate Code PE by paging [phone]. These results were called by telephone at the time of interpretation on 11/12/2017 at 11:43 pm to PA [facility] , who verbally acknowledged these results. 2. No active pulmonary disease. 3. Coronary arteriosclerosis along the LAD. 4. Mild aortic atherosclerosis without aneurysm or dissection. CT AP: 1. No acute solid nor hollow visceral organ pathology. 2. Degenerative disc disease L3-4. Electronically Signed   By: Ashley Royalty M.D.   On: 11/12/2017 23:44   Ct Abdomen Pelvis W Contrast  Result Date: 11/12/2017 CLINICAL DATA:  Right cheek and arm cramping. Chest pain and tachycardia. EXAM: CT ANGIOGRAPHY CHEST CT ABDOMEN AND PELVIS WITH CONTRAST TECHNIQUE: Multidetector CT imaging of the chest was performed using the standard protocol during bolus administration of intravenous contrast. Multiplanar CT image reconstructions and MIPs were obtained to evaluate the vascular anatomy. Multidetector CT imaging of the abdomen and pelvis was performed using the standard protocol during bolus administration of intravenous contrast. CONTRAST:  134m ISOVUE-300 IOPAMIDOL (ISOVUE-300) INJECTION 61% COMPARISON:  CXR 11/12/2017 FINDINGS: CTA CHEST FINDINGS Cardiovascular: Acute pulmonary emboli at the branch point of the right main pulmonary embolus extending into the right lobar level in the upper lobe and segmental  level to the right lower lobe. RV/LV ratio elevated at 0.79. Left ventricular hypertrophy is seen. No pericardial effusion. No aortic aneurysm or dissection. Mild ectasia of the ascending aorta to 3.7 cm. Common branch point of the right brachiocephalic and left common carotid arteries without stenosis of the great vessels. Coronary arteriosclerosis is noted along the LAD. Mediastinum/Nodes: No mediastinal nor hilar adenopathy. Trachea is unremarkable. Mainstem bronchi are patent. Lungs/Pleura: Dependent atelectasis. No acute pulmonary abnormality. No effusion or pneumothorax. No dominant mass. Elevated left hemidiaphragm. Musculoskeletal: No chest wall abnormality. No acute or significant osseous findings. Review of the MIP images confirms the above findings. CT ABDOMEN and PELVIS FINDINGS Hepatobiliary: No focal liver abnormality is seen. No gallstones, gallbladder wall thickening, or biliary dilatation. Pancreas: Unremarkable. No pancreatic ductal dilatation or surrounding inflammatory changes. Spleen: Normal size spleen without mass. Punctate calcification consistent with a tiny granuloma is  noted within. Adrenals/Urinary Tract: Normal bilateral adrenal glands. Symmetric cortical enhancement of kidneys without enhancing mass lesions. No nephrolithiasis nor hydroureteronephrosis. The urinary bladder is unremarkable for the degree of distention. Stomach/Bowel: Small hiatal hernia. Decompressed stomach with normal small bowel rotation. No bowel obstruction or inflammation. The terminal and distal ileum are normal. The appendix is not well visualized but no pericecal inflammatory change is seen. Moderate stool retention is noted within colon. Vascular/Lymphatic: Moderate aortoiliac atherosclerosis without aneurysm. No adenopathy. Reproductive: Hysterectomy.  No adnexal mass. Other: Tiny fat containing umbilical hernia. No free air nor free fluid. Musculoskeletal: Degenerative disc disease L3-4. No acute osseous  abnormality. Review of the MIP images confirms the above findings. IMPRESSION: Chest CT: 1. Positive for acute PE to the right upper and lower lobes with CT evidence of right heart strain (RV/LV Ratio = 0.78) consistent with at least submassive (intermediate risk) PE. The presence of right heart strain has been associated with an increased risk of morbidity and mortality. Please activate Code PE by paging [phone]. These results were called by telephone at the time of interpretation on 11/12/2017 at 11:43 pm to PA [facility] , who verbally acknowledged these results. 2. No active pulmonary disease. 3. Coronary arteriosclerosis along the LAD. 4. Mild aortic atherosclerosis without aneurysm or dissection. CT AP: 1. No acute solid nor hollow visceral organ pathology. 2. Degenerative disc disease L3-4. Electronically Signed   By: Ashley Royalty M.D.   On: 11/12/2017 23:44   Dg Chest Port 1 View  Result Date: 11/17/2017 CLINICAL DATA:  Fever EXAM: PORTABLE CHEST 1 VIEW COMPARISON:  CT chest 11/12/2017.  Chest x-ray 11/12/2017. FINDINGS: 1255 hours. The cardiopericardial silhouette is within normal limits for size. The visualized bony structures of the thorax are intact. Telemetry leads overlie the chest. Fairly prominent gaseous distention of colon visualized in the upper abdomen. Marked asymmetric elevation left hemidiaphragm with adjacent atelectasis in the left lung base. IMPRESSION: Asymmetric elevation left hemidiaphragm with adjacent left base atelectasis. Diffuse gaseous distention of colon within the visualized upper abdomen. Electronically Signed   By: Misty Stanley M.D.   On: 11/17/2017 13:07     Phillips Climes M.D on 11/18/2017 at 2:47 PM  Between 7am to 7pm - Pager - [phone]  After 7pm go to [email or web address] - password [facility]  [facility] -  Office  [phone]

## 2017-11-18 NOTE — Progress Notes (Signed)
PT Cancellation Note  Patient Details Name: [name] MRN: [id number] DOB: [birth year]   Cancelled Treatment:    Reason Eval/Treat Not Completed: Active bedrest order;Medical issues which prohibited therapy. Pt with order for strict bedrest. Resting HR 135 and resting BP 184/112.  PT to re-attempt as time allows.    Ilda Foil 11/18/2017, 8:34 AM   Aida Raider, PT  Office # [phone] Pager [phone]

## 2017-11-18 NOTE — Progress Notes (Signed)
ANTICOAGULATION CONSULT NOTE  Pharmacy Consult:  Heparin Indication:  Recent PE and DVT  No Known Allergies  Patient Measurements: Height: 67 inches Weight: 75.9 kg Heparin Dosing Weight: 76 kg  Vital Signs: Temp: 100.8 F (38.2 C) (07/05 1422) Temp Source: Oral (07/05 1422) BP: 153/89 (07/05 1636) Pulse Rate: 129 (07/05 1636)  Labs: Recent Labs    11/17/17 1155 11/17/17 1602  11/17/17 1911 11/17/17 2252 11/18/17 0731 11/18/17 1647  HGB 11.8*  --   --  11.9*  --  11.5*  --   HCT 38.1  --   --  37.4  --  36.4  --   PLT 277  --   --  249  --  240  --   APTT  --   --    < > 38* 55* 41* 39*  LABPROT  --  14.8  --  15.5*  --   --   --   INR  --  1.17  --  1.24  --   --   --   HEPARINUNFRC  --   --   --   --  0.99* 0.75* 0.51  CREATININE 0.92  --   --   --   --  0.74  --    < > = values in this interval not displayed.   Assessment: 20 YOF recently diagnosed with right leg DVT and bilateral PE.  She was discharged on Eliquis but her last dose was on 7/2.  We are dosing Heparin while her work up continues.  She was started at 1050 units/hr and aPTT was low at 55 seconds.  We increased rate by 200 units to 1250 units/hr and repeat levels indicate a still inadequate response.  Would expect both aPTT and heparin levels to correlate with next labs.  PTT currently 39 seconds, heparin level = 0.51  Goal of Therapy:  Heparin level 0.3-0.7 units/ml Monitor platelets by anticoagulation protocol: Yes  PTT = 66 to 102 seconds   Plan:  Increase heparin infusion to 1400 units/hr Recheck heparin level and aPTT in 8 hours Daily heparin level and CBC (daily aPTT until labs correlate)  Nadara Mustard, PharmD., MS Clinical Pharmacist Pager:  [phone] Thank you for allowing pharmacy to be part of this patients care team. 11/18/2017 5:51 PM

## 2017-11-18 NOTE — Progress Notes (Signed)
ANTICOAGULATION CONSULT NOTE  Pharmacy Consult:  Heparin Indication:  Recent PE and DVT  No Known Allergies  Patient Measurements: Height: 67 inches Weight: 75.9 kg Heparin Dosing Weight: 76 kg  Vital Signs: Temp: 100.6 F (38.1 C) (07/05 0814) Temp Source: Oral (07/05 0814) BP: 137/96 (07/05 0912) Pulse Rate: 125 (07/05 0912)  Labs: Recent Labs    11/17/17 1155 11/17/17 1602 11/17/17 1911 11/17/17 2252 11/18/17 0731  HGB 11.8*  --  11.9*  --  11.5*  HCT 38.1  --  37.4  --  36.4  PLT 277  --  249  --  240  APTT  --   --  38* 55* 41*  LABPROT  --  14.8 15.5*  --   --   INR  --  1.17 1.24  --   --   HEPARINUNFRC  --   --   --  0.99* 0.75*  CREATININE 0.92  --   --   --  0.74    Estimated Creatinine Clearance: 76.8 mL/min (by C-G formula based on SCr of 0.74 mg/dL).  Assessment: 59 YOF recently diagnosed with right leg DVT and bilateral PE.  She was discharged on Eliquis but has not been receiving med- last dose was on 7/2 on day of discharge.  First heparin level elevated at 0.99units/mL with aPTT low at 55 seconds - this reflects effect of Eliquis on labs and will use aPTT to adjust infusion. No bleeding noted.  PTT currently 41 seconds, heparin level = 0.75  Goal of Therapy:  Heparin level 0.3-0.7 units/ml Monitor platelets by anticoagulation protocol: Yes  PTT = 66 to 102 seconds    Plan:  Increase heparin infusion to 1250 units/hr Recheck heparin level and aPTT in 6 hours Daily heparin level and CBC (daily aPTT until labs correlate)  Thank you Okey Regal, PharmD [phone] 11/18/2017 9:49 AM

## 2017-11-18 NOTE — Progress Notes (Addendum)
BP (!) 184/112 (BP Location: Right Arm)   Pulse (!) 133   Temp (!) 100.6 F (38.1 C) (Oral)   Resp (!) 23   Wt 74.3 kg (163 lb 12.8 oz)   SpO2 93%   BMI 25.66 kg/m    Pt alert, oriented only to self (knows she is in the hospital but not date, not president, not season, not what holiday just passed) and reports being tired. MD made aware.  0845 - Pt sustaining HR 140. Prn Hydralazine and po metoprolol and lisinopril and clonidine given. Pt denies pain, just says tired. Will continue to monitor  0912 - repeat BP 137/96 (MAP 107), HR 125

## 2017-11-19 DIAGNOSIS — I2609 Other pulmonary embolism with acute cor pulmonale: Principal | ICD-10-CM

## 2017-11-19 LAB — BASIC METABOLIC PANEL WITH GFR
Anion gap: 9 (ref 5–15)
BUN: 19 mg/dL (ref 8–23)
CO2: 23 mmol/L (ref 22–32)
Calcium: 8.3 mg/dL — ABNORMAL LOW (ref 8.9–10.3)
Chloride: 107 mmol/L (ref 98–111)
Creatinine, Ser: 1.61 mg/dL — ABNORMAL HIGH (ref 0.44–1.00)
GFR calc Af Amer: 39 mL/min — ABNORMAL LOW
GFR calc non Af Amer: 33 mL/min — ABNORMAL LOW
Glucose, Bld: 168 mg/dL — ABNORMAL HIGH (ref 70–99)
Potassium: 3.8 mmol/L (ref 3.5–5.1)
Sodium: 139 mmol/L (ref 135–145)

## 2017-11-19 LAB — GLUCOSE, CAPILLARY
Glucose-Capillary: 167 mg/dL — ABNORMAL HIGH (ref 70–99)
Glucose-Capillary: 151 mg/dL — ABNORMAL HIGH (ref 70–99)
Glucose-Capillary: 115 mg/dL — ABNORMAL HIGH (ref 70–99)
Glucose-Capillary: 219 mg/dL — ABNORMAL HIGH (ref 70–99)

## 2017-11-19 LAB — CBC
HCT: 31.8 % — ABNORMAL LOW (ref 36.0–46.0)
HEMOGLOBIN: 10.1 g/dL — AB (ref 12.0–15.0)
MCH: 25.2 pg — ABNORMAL LOW (ref 26.0–34.0)
MCHC: 31.8 g/dL (ref 30.0–36.0)
MCV: 79.3 fL (ref 78.0–100.0)
PLATELETS: 208 10*3/uL (ref 150–400)
RBC: 4.01 MIL/uL (ref 3.87–5.11)
RDW: 14.8 % (ref 11.5–15.5)
WBC: 17.4 10*3/uL — ABNORMAL HIGH (ref 4.0–10.5)

## 2017-11-19 LAB — TROPONIN I
Troponin I: 0.04 ng/mL
Troponin I: <0.03 ng/mL (ref <0.03)
Troponin I: 0.03 ng/mL

## 2017-11-19 LAB — HEPARIN LEVEL (UNFRACTIONATED): Heparin Unfractionated: 0.55 [IU]/mL (ref 0.30–0.70)

## 2017-11-19 LAB — PROCALCITONIN: Procalcitonin: 0.8 ng/mL

## 2017-11-19 LAB — APTT: APTT: 43 s — AB (ref 24–36)

## 2017-11-19 MED ORDER — NITROGLYCERIN 0.4 MG SL SUBL
0.4000 mg | SUBLINGUAL_TABLET | SUBLINGUAL | Status: DC | PRN
  Administered 2017-11-19 (×2): 0.4 mg via SUBLINGUAL
  Filled 2017-11-19: qty 1

## 2017-11-19 MED ORDER — MORPHINE SULFATE (PF) 2 MG/ML IV SOLN
2.0000 mg | Freq: Once | INTRAVENOUS | Status: AC
  Administered 2017-11-19: 2 mg via INTRAVENOUS
  Filled 2017-11-19: qty 1

## 2017-11-19 MED ORDER — VANCOMYCIN HCL IN DEXTROSE 1-5 GM/200ML-% IV SOLN: 1000.0000 mg | INTRAVENOUS | Status: DC

## 2017-11-19 NOTE — Progress Notes (Signed)
PT Cancellation Note  Patient Details Name: [name] MRN: [id number] DOB: [birth year]   Cancelled Treatment:    Reason Eval/Treat Not Completed: Active bedrest order;Medical issues which prohibited therapy(Pt with strict bedrest order.  )MD:  Please update activity level when mobility is desired.  Thanks.    Amadeo Garnet Moe Graca 11/19/2017, 8:19 AM  [facility] [phone] (pager)

## 2017-11-19 NOTE — Progress Notes (Signed)
OT Cancellation Note  Patient Details Name: [name] MRN: [id number] DOB: [birth year]   Cancelled Treatment:    Reason Eval/Treat Not Completed: Active bedrest order.   Ardeen Jourdain [name] OTR/L 11/19/2017, 7:46 AM

## 2017-11-19 NOTE — Progress Notes (Signed)
ANTICOAGULATION CONSULT NOTE  Pharmacy Consult:  Heparin Indication:  Recent PE and DVT  No Known Allergies  Patient Measurements: Height: 67 inches Weight: 75.9 kg Heparin Dosing Weight: 76 kg  Vital Signs: Temp: 98.7 F (37.1 C) (07/06 0011) Temp Source: Oral (07/06 0011) BP: 156/88 (07/05 2149) Pulse Rate: 129 (07/05 1636)  Labs: Recent Labs    11/17/17 1155 11/17/17 1602 11/17/17 1911  11/18/17 0731 11/18/17 1647 11/19/17 0034  HGB 11.8*  --  11.9*  --  11.5*  --  10.1*  HCT 38.1  --  37.4  --  36.4  --  31.8*  PLT 277  --  249  --  240  --  208  APTT  --   --  38*   < > 41* 39* 43*  LABPROT  --  14.8 15.5*  --   --   --   --   INR  --  1.17 1.24  --   --   --   --   HEPARINUNFRC  --   --   --    < > 0.75* 0.51 0.55  CREATININE 0.92  --   --   --  0.74  --   --    < > = values in this interval not displayed.    Estimated Creatinine Clearance: 76.8 mL/min (by C-G formula based on SCr of 0.74 mg/dL).  Assessment: 32 YOF recently diagnosed with right leg DVT and bilateral PE.  She was discharged on Eliquis but has not been receiving med- last dose was on 7/2 on day of discharge.  First heparin level elevated at 0.99units/mL with aPTT low at 55 seconds - this reflects effect of Eliquis on labs and will use aPTT to adjust infusion. No bleeding noted.  7/6 AM update: heparin level is therapeutic x 2 this AM, given corresponding increase in heparin level and aPTT this AM after rate increase, will start using heparin level only to dose.   Goal of Therapy:  Heparin level 0.3-0.7 units/ml Monitor platelets by anticoagulation protocol: Yes   Plan:  Cont heparin at 1400 units/hr Daily CBC/HL Monitor for bleeding  Abran Duke, PharmD, BCPS Clinical Pharmacist Phone: [phone]

## 2017-11-19 NOTE — Progress Notes (Signed)
Pt c/o left lower chest pain 8/10, aching and constant pressure, Nitro given x 2, morphine given, EKG completed. After second nitro pt denied pain, resting now. MD notified.

## 2017-11-19 NOTE — Progress Notes (Signed)
Pharmacy Antibiotic Note  [name] is a 62 y.o. female admitted on 11/17/2017 with sepsis.  Pharmacy has been consulted for vancomycin and Zosyn dosing. Patient met SIRS criteria on admission. WBC remain elevated at 17.4. Tmax 100.8. No clear etiology found. PCT 0.26>0.80.   Plan: Zosyn 3.375g IV q8h (4 hour infusion). The dose of vancomycin will be adjusted to 1,000 mg every 12 hours based on renal function.  Follow renal function, source of infxn, de-esc of abx, and CBC.  Weight: 168 lb 10.4 oz (76.5 kg)  Temp (24hrs), Avg:99 F (37.2 C), Min:98.1 F (36.7 C), Max:100.8 F (38.2 C)  Recent Labs  Lab 11/13/17 0356 11/14/17 0527 11/15/17 0438 11/17/17 1155 11/17/17 1245 11/17/17 1613 11/17/17 1705 11/17/17 1910 11/17/17 1911 11/18/17 0731 11/19/17 0034  WBC 11.6* 13.2* 10.3 22.1*  --   --   --   --  21.8* 19.6* 17.4*  CREATININE 0.75 0.64  --  0.92  --   --   --   --   --  0.74 1.61*  LATICACIDVEN  --   --   --   --  3.26* 2.88* 2.8* 1.5  --   --   --     Estimated Creatinine Clearance: 38.7 mL/min (A) (by C-G formula based on SCr of 1.61 mg/dL (H)).    No Known Allergies  Antimicrobials this admission: Vancomycin 7/5 >> Zosyn 7/5 >>  Microbiology results: 7/4 BC x 2> 7/4 urine >   Thank you for allowing pharmacy to be a part of this patient's care.  Lizzete Gough A Chester Sibert 11/19/2017 11:32 AM

## 2017-11-19 NOTE — Progress Notes (Signed)
PROGRESS NOTE                                                                                                                                                                                                             Patient Demographics:    [name], is a 62 y.o. female, DOB - 21-Apr-1956, XBJ:[id number]  Admit date - 11/17/2017   Admitting Physician Karmen Bongo, MD  Outpatient Primary MD for the patient is Reymundo Poll, MD  LOS - 2   No chief complaint on file.      Brief Narrative    62 y.o. female with medical history significant for multiple sclerosis bedbound, diabetes, hypertension, hyperlipidemia, and a recent hospitalization  for PE and DVT, she was discharged on 11/15/2017 on Eliquis to ALF, unclear at this point if she was taking her Eliquis or not, she presents secondary to tachycardia, she was needed noted to have SIRS, repeat CTA chest showing with increased clot burden .   Subjective:    Lennyx Verdell today denies any complaints .  But by reviewing her records she has a complaint of chest pain overnight, she had negative troponins, but when asked her this morning she denies any chest pain   Assessment  & Plan :    Active Problems:   MS (multiple sclerosis) (HCC)   Fever   DM2 (diabetes mellitus, type 2) (HCC)   Encephalopathy, metabolic   Depression   Pyelonephritis, acute   Pulmonary embolism (HCC)   DVT (deep venous thrombosis) Rt Leg   Leukocytosis   Sepsis (HCC)  SIRS -SIRS criteria met on admission, leukocytosis, fever, tachycardia and tachypnea, and elevated lactic acid, so far there is no clear infectious etiology, CT chest abdomen pelvis with no evidence of infectious etiology, she has negative urine analysis, blood cultures remain negative, empirically on vancomycin and Zosyn, appears to be improving, leukocytosis trending down, afebrile over the last 24 hours I will discontinue IV vancomycin, and continue with  Zosyn for now, we will continue to trend procalcitonin.  Sinus tachycardia -Improving after giving some fluids, and increasing her Toprol-XL  PE -Patient with recent diagnosis of PE, she was discharged on Eliquis, she was discharged to an assisted living facility, at this point unclear if she was taking it or not, giving her tachycardia, and how symptomatic that she will continue  with IV heparin for now, case manager has discussed with assisted living facility, apparently patient was taking her Eliquis..  Type 2 diabetes mellitus -Continue with insulin sliding scale   Hypertension -Meds labile, but overall much improved after increasing her Toprol-XL from 50 to 75 mg daily    Hyperlipidemia Continue home statins   Anemia of chronic disease,  -Continue to monitor H&H closely as she is on anticoagulation   History of MS,  - with neuromuscular deficits, the patient does interferon beta injections 30 mcg q. 7 days, supportive care, home health nursing and physical therapy Follow up with neurology as outpatient       Code Status : DNR  Family Communication  : none at bedside  Disposition Plan  : Alf when stable  Consults  :  none  Procedures  : none  DVT Prophylaxis  :  On heparin GTT  Lab Results  Component Value Date   PLT 208 11/19/2017    Antibiotics  :    Anti-infectives (From admission, onward)   Start     Dose/Rate Route Frequency Ordered Stop   11/20/17 0230  vancomycin (VANCOCIN) IVPB 1000 mg/200 mL premix  Status:  Discontinued     1,000 mg 200 mL/hr over 60 Minutes Intravenous Every 24 hours 11/19/17 1138 11/19/17 1609   11/18/17 0200  vancomycin (VANCOCIN) IVPB 1000 mg/200 mL premix  Status:  Discontinued     1,000 mg 200 mL/hr over 60 Minutes Intravenous Every 12 hours 11/17/17 1429 11/19/17 1138   11/17/17 2200  piperacillin-tazobactam (ZOSYN) IVPB 3.375 g     3.375 g 12.5 mL/hr over 240 Minutes Intravenous Every 8 hours 11/17/17 1429      11/17/17 1300  piperacillin-tazobactam (ZOSYN) IVPB 3.375 g     3.375 g 100 mL/hr over 30 Minutes Intravenous  Once 11/17/17 1255 11/17/17 1352   11/17/17 1300  vancomycin (VANCOCIN) IVPB 1000 mg/200 mL premix     1,000 mg 200 mL/hr over 60 Minutes Intravenous  Once 11/17/17 1255 11/17/17 1501        Objective:   Vitals:   11/19/17 0613 11/19/17 0758 11/19/17 0800 11/19/17 0801  BP: (!) 143/85 (!) 137/107  (!) 142/91  Pulse: 93 89 88 86  Resp: 17 (!) 22  12  Temp:  98.1 F (36.7 C)    TempSrc:  Oral    SpO2: 98% 97%  100%  Weight:        Wt Readings from Last 3 Encounters:  11/19/17 76.5 kg (168 lb 10.4 oz)  11/13/17 75.9 kg (167 lb 5.3 oz)  01/21/17 78.9 kg (174 lb)     Intake/Output Summary (Last 24 hours) at 11/19/2017 1610 Last data filed at 11/19/2017 1558 Gross per 24 hour  Intake 1130 ml  Output 400 ml  Net 730 ml     Physical Exam  Awake Alert, frail elderly female, laying in bed in no apparent distress Symmetrical Chest wall movement, Good air movement bilaterally, CTAB RRR,No Gallops,Rubs or new Murmurs, No Parasternal Heave +ve B.Sounds, Abd Soft, No tenderness, No organomegaly appriciated, No rebound - guarding or rigidity. No Cyanosis, Clubbing or edema, No new Rash or bruise       Data Review:    CBC Recent Labs  Lab 11/15/17 0438 11/17/17 1155 11/17/17 1911 11/18/17 0731 11/19/17 0034  WBC 10.3 22.1* 21.8* 19.6* 17.4*  HGB 11.5* 11.8* 11.9* 11.5* 10.1*  HCT 36.8 38.1 37.4 36.4 31.8*  PLT 262 277 249 240 208  MCV  79.8 81.4 79.9 80.0 79.3  MCH 24.9* 25.2* 25.4* 25.3* 25.2*  MCHC 31.3 31.0 31.8 31.6 31.8  RDW 14.5 15.1 15.0 15.0 14.8  LYMPHSABS  --  1.7 1.8  --   --   MONOABS  --  2.5* 2.4*  --   --   EOSABS  --  0.0 0.0  --   --   BASOSABS  --  0.1 0.1  --   --     Chemistries  Recent Labs  Lab 11/13/17 0356 11/14/17 0527 11/17/17 1155 11/18/17 0731 11/19/17 0034  NA 149* 139 142 143 139  K 3.7 3.3* 3.7 3.5 3.8  CL 113* 108  109 108 107  CO2 25 20* '22 24 23  ' GLUCOSE 161* 125* 221* 160* 168*  BUN '15 13 12 9 19  ' CREATININE 0.75 0.64 0.92 0.74 1.61*  CALCIUM 9.1 8.4* 9.1 8.9 8.3*  AST 13*  --  42*  --   --   ALT 14  --  58*  --   --   ALKPHOS 70  --  70  --   --   BILITOT 0.7  --  0.7  --   --    ------------------------------------------------------------------------------------------------------------------ No results for input(s): CHOL, HDL, LDLCALC, TRIG, CHOLHDL, LDLDIRECT in the last 72 hours.  Lab Results  Component Value Date   HGBA1C 6.1 (H) 12/29/2011   ------------------------------------------------------------------------------------------------------------------ No results for input(s): TSH, T4TOTAL, T3FREE, THYROIDAB in the last 72 hours.  Invalid input(s): FREET3 ------------------------------------------------------------------------------------------------------------------ No results for input(s): VITAMINB12, FOLATE, FERRITIN, TIBC, IRON, RETICCTPCT in the last 72 hours.  Coagulation profile Recent Labs  Lab 11/17/17 1602 11/17/17 1911  INR 1.17 1.24    No results for input(s): DDIMER in the last 72 hours.  Cardiac Enzymes Recent Labs  Lab 11/19/17 0114 11/19/17 0731 11/19/17 1330  TROPONINI 0.04* <0.03 0.03*   ------------------------------------------------------------------------------------------------------------------ No results found for: BNP  Inpatient Medications  Scheduled Meds: . aspirin  81 mg Oral Daily  . atorvastatin  20 mg Oral q1800  . cholecalciferol  800 Units Oral Daily  . cloNIDine  0.1 mg Oral BID  . ferrous sulfate  325 mg Oral Q breakfast  . gabapentin  300 mg Oral Q12H  . hydroxypropyl methylcellulose / hypromellose  1 drop Both Eyes BID  . insulin aspart  0-9 Units Subcutaneous TID WC  . latanoprost  1 drop Both Eyes QHS  . lisinopril  20 mg Oral Daily  . mouth rinse  15 mL Mouth Rinse BID  . metoprolol succinate  75 mg Oral Daily  .  polyethylene glycol  17 g Oral Daily   Continuous Infusions: . sodium chloride Stopped (11/19/17 0931)  . heparin 1,400 Units/hr (11/19/17 0348)  . piperacillin-tazobactam (ZOSYN)  IV 3.375 g (11/19/17 1541)   PRN Meds:.acetaminophen **OR** acetaminophen, bisacodyl, diphenhydrAMINE, hydrALAZINE, HYDROcodone-acetaminophen, nitroGLYCERIN, ondansetron **OR** ondansetron (ZOFRAN) IV, senna-docusate, zinc oxide  Micro Results Recent Results (from the past 240 hour(s))  MRSA PCR Screening     Status: None   Collection Time: 11/13/17  2:53 PM  Result Value Ref Range Status   MRSA by PCR NEGATIVE NEGATIVE Final    Comment:        The GeneXpert MRSA Assay (FDA approved for NASAL specimens only), is one component of a comprehensive MRSA colonization surveillance program. It is not intended to diagnose MRSA infection nor to guide or monitor treatment for MRSA infections. Performed at [facility], [place] [address] [place], [place] [address]  Blood Culture (routine x 2)     Status: None (Preliminary result)   Collection Time: 11/17/17 12:33 PM  Result Value Ref Range Status   Specimen Description BLOOD LEFT ANTECUBITAL  Final   Special Requests   Final    BOTTLES DRAWN AEROBIC AND ANAEROBIC Blood Culture results may not be optimal due to an inadequate volume of blood received in culture bottles   Culture   Final    NO GROWTH 2 DAYS Performed at [facility], [place] [address] [place], [place] [address]    Report Status PENDING  Incomplete  Blood Culture (routine x 2)     Status: None (Preliminary result)   Collection Time: 11/17/17  1:16 PM  Result Value Ref Range Status   Specimen Description BLOOD RIGHT HAND  Final   Special Requests   Final    BOTTLES DRAWN AEROBIC AND ANAEROBIC Blood Culture adequate volume   Culture   Final    NO GROWTH 2 DAYS Performed at [facility], [place] [address], [place], [place] [address]    Report Status PENDING  Incomplete  Culture,  blood (x 2)     Status: None (Preliminary result)   Collection Time: 11/17/17  4:16 PM  Result Value Ref Range Status   Specimen Description BLOOD LEFT ANTECUBITAL  Final   Special Requests   Final    BOTTLES DRAWN AEROBIC AND ANAEROBIC Blood Culture results may not be optimal due to an inadequate volume of blood received in culture bottles   Culture   Final    NO GROWTH 2 DAYS Performed at [facility], [place] [address], [place], [place] [address]    Report Status PENDING  Incomplete  Culture, blood (x 2)     Status: None (Preliminary result)   Collection Time: 11/17/17  4:21 PM  Result Value Ref Range Status   Specimen Description BLOOD RIGHT HAND  Final   Special Requests   Final    BOTTLES DRAWN AEROBIC AND ANAEROBIC Blood Culture adequate volume   Culture   Final    NO GROWTH 2 DAYS Performed at [facility], [place] [address], [place], [place] [address]    Report Status PENDING  Incomplete    Radiology Reports Ct Abdomen Pelvis Wo Contrast  Result Date: 11/17/2017 CLINICAL DATA:  62 year old female with acute abdominal pain and diarrhea. EXAM: CT ABDOMEN AND PELVIS WITHOUT CONTRAST TECHNIQUE: Multidetector CT imaging of the abdomen and pelvis was performed following the standard protocol without IV contrast. COMPARISON:  11/12/2017 CT FINDINGS: Please note that parenchymal abnormalities may be missed without intravenous contrast. Lower chest: Bibasilar atelectasis again noted. Elevated LEFT hemidiaphragm again noted. Hepatobiliary: The liver and gallbladder are unremarkable. No definite biliary dilatation. Pancreas: Unremarkable Spleen: Unremarkable Adrenals/Urinary Tract: The kidneys, adrenal glands and bladder are unremarkable. Stomach/Bowel: Stomach is within normal limits. No evidence of bowel wall thickening, distention, or inflammatory changes. Vascular/Lymphatic: Aortic atherosclerosis. No enlarged abdominal or pelvic lymph nodes. Reproductive: Status post hysterectomy.  No adnexal masses. Other: No ascites, focal collection or pneumoperitoneum. Musculoskeletal: No acute or suspicious bony abnormality. Apex LEFT lumbar scoliosis and moderate degenerative disc disease at L3-4 again identified. IMPRESSION: 1. No evidence of acute abnormality within the abdomen or pelvis. 2. Bibasilar atelectasis again noted 3.  Aortic Atherosclerosis (ICD10-I70.0). Electronically Signed   By: Margarette Canada M.D.   On: 11/17/2017 15:39   Dg Chest 2 View  Result Date: 11/12/2017 CLINICAL DATA:  Arm pain. EXAM:  CHEST - 2 VIEW COMPARISON:  Chest x-ray dated December 28, 2011. FINDINGS: The heart size and mediastinal contours are within normal limits. Normal pulmonary vascularity. No focal consolidation, pleural effusion, or pneumothorax. Unchanged elevation of the left hemidiaphragm. No acute osseous abnormality. IMPRESSION: No active cardiopulmonary disease. Electronically Signed   By: Titus Dubin M.D.   On: 11/12/2017 19:56   Ct Angio Chest Pe W Or Wo Contrast  Result Date: 11/17/2017 CLINICAL DATA:  Tachycardia.  Recent PE/DVT. EXAM: CT ANGIOGRAPHY CHEST WITH CONTRAST TECHNIQUE: Multidetector CT imaging of the chest was performed using the standard protocol during bolus administration of intravenous contrast. Multiplanar CT image reconstructions and MIPs were obtained to evaluate the vascular anatomy. CONTRAST:  160m ISOVUE-370 IOPAMIDOL (ISOVUE-370) INJECTION 76% COMPARISON:  11/12/2016 FINDINGS: Cardiovascular: Heart is normal in size. Calcified plaque over the left anterior descending and right coronary arteries. Mild calcified plaque over the thoracic aorta. Pulmonary arterial system is well opacified demonstrates no significant change in moderate emboli over the right lower lobar pulmonary arteries with slight improvement in the proximal right upper lobar arteries and emboli over the right middle lobar pulmonary artery. New pulmonary emboli over the left upper lobe and proximal left lower  lobar pulmonary arteries. RV/LV ratio is 33.1/35.4 is 0.93. Mediastinum/Nodes: No mediastinal or hilar adenopathy. Remaining mediastinal structures are within normal. Lungs/Pleura: Elevated left hemidiaphragm with slight worsening opacification left base likely atelectasis. Right lung is clear. Airways are normal. Upper Abdomen: No acute findings. Calcified plaque over the abdominal aorta. Musculoskeletal: Unchanged. Review of the MIP images confirms the above findings. IMPRESSION: Known right-sided pulmonary emboli without significant overall change and new pulmonary emboli over the proximal right upper and lower lobar pulmonary arteries. Positive for acute PE with CT evidence of right heart strain (RV/LV Ratio = 0.93) consistent with at least submassive (intermediate risk) PE. The presence of right heart strain has been associated with an increased risk of morbidity and mortality. Please activate Code PE by paging [phone] Slight worsening opacification left base likely atelectasis. Atherosclerotic coronary artery disease. Aortic Atherosclerosis (ICD10-I70.0). These results will be called to the ordering clinician or representative by the Radiologist Assistant, and communication documented in the PACS or zVision Dashboard. Electronically Signed   By: DMarin OlpM.D.   On: 11/17/2017 22:15   Ct Angio Chest Pe W/cm &/or Wo Cm  Result Date: 11/12/2017 CLINICAL DATA:  Right cheek and arm cramping. Chest pain and tachycardia. EXAM: CT ANGIOGRAPHY CHEST CT ABDOMEN AND PELVIS WITH CONTRAST TECHNIQUE: Multidetector CT imaging of the chest was performed using the standard protocol during bolus administration of intravenous contrast. Multiplanar CT image reconstructions and MIPs were obtained to evaluate the vascular anatomy. Multidetector CT imaging of the abdomen and pelvis was performed using the standard protocol during bolus administration of intravenous contrast. CONTRAST:  1065mISOVUE-300 IOPAMIDOL  (ISOVUE-300) INJECTION 61% COMPARISON:  CXR 11/12/2017 FINDINGS: CTA CHEST FINDINGS Cardiovascular: Acute pulmonary emboli at the branch point of the right main pulmonary embolus extending into the right lobar level in the upper lobe and segmental level to the right lower lobe. RV/LV ratio elevated at 0.79. Left ventricular hypertrophy is seen. No pericardial effusion. No aortic aneurysm or dissection. Mild ectasia of the ascending aorta to 3.7 cm. Common branch point of the right brachiocephalic and left common carotid arteries without stenosis of the great vessels. Coronary arteriosclerosis is noted along the LAD. Mediastinum/Nodes: No mediastinal nor hilar adenopathy. Trachea is unremarkable. Mainstem bronchi are patent. Lungs/Pleura: Dependent atelectasis. No acute  pulmonary abnormality. No effusion or pneumothorax. No dominant mass. Elevated left hemidiaphragm. Musculoskeletal: No chest wall abnormality. No acute or significant osseous findings. Review of the MIP images confirms the above findings. CT ABDOMEN and PELVIS FINDINGS Hepatobiliary: No focal liver abnormality is seen. No gallstones, gallbladder wall thickening, or biliary dilatation. Pancreas: Unremarkable. No pancreatic ductal dilatation or surrounding inflammatory changes. Spleen: Normal size spleen without mass. Punctate calcification consistent with a tiny granuloma is noted within. Adrenals/Urinary Tract: Normal bilateral adrenal glands. Symmetric cortical enhancement of kidneys without enhancing mass lesions. No nephrolithiasis nor hydroureteronephrosis. The urinary bladder is unremarkable for the degree of distention. Stomach/Bowel: Small hiatal hernia. Decompressed stomach with normal small bowel rotation. No bowel obstruction or inflammation. The terminal and distal ileum are normal. The appendix is not well visualized but no pericecal inflammatory change is seen. Moderate stool retention is noted within colon. Vascular/Lymphatic: Moderate  aortoiliac atherosclerosis without aneurysm. No adenopathy. Reproductive: Hysterectomy.  No adnexal mass. Other: Tiny fat containing umbilical hernia. No free air nor free fluid. Musculoskeletal: Degenerative disc disease L3-4. No acute osseous abnormality. Review of the MIP images confirms the above findings. IMPRESSION: Chest CT: 1. Positive for acute PE to the right upper and lower lobes with CT evidence of right heart strain (RV/LV Ratio = 0.78) consistent with at least submassive (intermediate risk) PE. The presence of right heart strain has been associated with an increased risk of morbidity and mortality. Please activate Code PE by paging [phone]. These results were called by telephone at the time of interpretation on 11/12/2017 at 11:43 pm to PA [facility] , who verbally acknowledged these results. 2. No active pulmonary disease. 3. Coronary arteriosclerosis along the LAD. 4. Mild aortic atherosclerosis without aneurysm or dissection. CT AP: 1. No acute solid nor hollow visceral organ pathology. 2. Degenerative disc disease L3-4. Electronically Signed   By: Ashley Royalty M.D.   On: 11/12/2017 23:44   Ct Abdomen Pelvis W Contrast  Result Date: 11/12/2017 CLINICAL DATA:  Right cheek and arm cramping. Chest pain and tachycardia. EXAM: CT ANGIOGRAPHY CHEST CT ABDOMEN AND PELVIS WITH CONTRAST TECHNIQUE: Multidetector CT imaging of the chest was performed using the standard protocol during bolus administration of intravenous contrast. Multiplanar CT image reconstructions and MIPs were obtained to evaluate the vascular anatomy. Multidetector CT imaging of the abdomen and pelvis was performed using the standard protocol during bolus administration of intravenous contrast. CONTRAST:  11m ISOVUE-300 IOPAMIDOL (ISOVUE-300) INJECTION 61% COMPARISON:  CXR 11/12/2017 FINDINGS: CTA CHEST FINDINGS Cardiovascular: Acute pulmonary emboli at the branch point of the right main pulmonary embolus extending into the right  lobar level in the upper lobe and segmental level to the right lower lobe. RV/LV ratio elevated at 0.79. Left ventricular hypertrophy is seen. No pericardial effusion. No aortic aneurysm or dissection. Mild ectasia of the ascending aorta to 3.7 cm. Common branch point of the right brachiocephalic and left common carotid arteries without stenosis of the great vessels. Coronary arteriosclerosis is noted along the LAD. Mediastinum/Nodes: No mediastinal nor hilar adenopathy. Trachea is unremarkable. Mainstem bronchi are patent. Lungs/Pleura: Dependent atelectasis. No acute pulmonary abnormality. No effusion or pneumothorax. No dominant mass. Elevated left hemidiaphragm. Musculoskeletal: No chest wall abnormality. No acute or significant osseous findings. Review of the MIP images confirms the above findings. CT ABDOMEN and PELVIS FINDINGS Hepatobiliary: No focal liver abnormality is seen. No gallstones, gallbladder wall thickening, or biliary dilatation. Pancreas: Unremarkable. No pancreatic ductal dilatation or surrounding inflammatory changes. Spleen: Normal size spleen without mass.  Punctate calcification consistent with a tiny granuloma is noted within. Adrenals/Urinary Tract: Normal bilateral adrenal glands. Symmetric cortical enhancement of kidneys without enhancing mass lesions. No nephrolithiasis nor hydroureteronephrosis. The urinary bladder is unremarkable for the degree of distention. Stomach/Bowel: Small hiatal hernia. Decompressed stomach with normal small bowel rotation. No bowel obstruction or inflammation. The terminal and distal ileum are normal. The appendix is not well visualized but no pericecal inflammatory change is seen. Moderate stool retention is noted within colon. Vascular/Lymphatic: Moderate aortoiliac atherosclerosis without aneurysm. No adenopathy. Reproductive: Hysterectomy.  No adnexal mass. Other: Tiny fat containing umbilical hernia. No free air nor free fluid. Musculoskeletal:  Degenerative disc disease L3-4. No acute osseous abnormality. Review of the MIP images confirms the above findings. IMPRESSION: Chest CT: 1. Positive for acute PE to the right upper and lower lobes with CT evidence of right heart strain (RV/LV Ratio = 0.78) consistent with at least submassive (intermediate risk) PE. The presence of right heart strain has been associated with an increased risk of morbidity and mortality. Please activate Code PE by paging [phone]. These results were called by telephone at the time of interpretation on 11/12/2017 at 11:43 pm to PA [facility] , who verbally acknowledged these results. 2. No active pulmonary disease. 3. Coronary arteriosclerosis along the LAD. 4. Mild aortic atherosclerosis without aneurysm or dissection. CT AP: 1. No acute solid nor hollow visceral organ pathology. 2. Degenerative disc disease L3-4. Electronically Signed   By: Ashley Royalty M.D.   On: 11/12/2017 23:44   Dg Chest Port 1 View  Result Date: 11/17/2017 CLINICAL DATA:  Fever EXAM: PORTABLE CHEST 1 VIEW COMPARISON:  CT chest 11/12/2017.  Chest x-ray 11/12/2017. FINDINGS: 1255 hours. The cardiopericardial silhouette is within normal limits for size. The visualized bony structures of the thorax are intact. Telemetry leads overlie the chest. Fairly prominent gaseous distention of colon visualized in the upper abdomen. Marked asymmetric elevation left hemidiaphragm with adjacent atelectasis in the left lung base. IMPRESSION: Asymmetric elevation left hemidiaphragm with adjacent left base atelectasis. Diffuse gaseous distention of colon within the visualized upper abdomen. Electronically Signed   By: Misty Stanley M.D.   On: 11/17/2017 13:07     Phillips Climes M.D on 11/19/2017 at 4:10 PM  Between 7am to 7pm - Pager - [phone]  After 7pm go to [email or web address] - password [facility]  [facility] -  Office  [phone]

## 2017-11-19 NOTE — Progress Notes (Signed)
CRITICAL VALUE ALERT  Critical Value:  Troponin 0.04  Date & Time Notied: 11/19/17 at 0240  Provider Notified: Rana Snare, MD  Orders Received/Actions taken: No new orders at this time

## 2017-11-19 NOTE — Progress Notes (Signed)
Pt c/o abdominal pain 8/10, Norco/Vicodine given. After pt was medicated, she c/o pain between left lower chest and upper abdomen. She describe pain as constant aching and pressure. VS stable. MD page. Pt not in acute distress.

## 2017-11-20 DIAGNOSIS — N179 Acute kidney failure, unspecified: Secondary | ICD-10-CM

## 2017-11-20 DIAGNOSIS — E1111 Type 2 diabetes mellitus with ketoacidosis with coma: Secondary | ICD-10-CM

## 2017-11-20 LAB — CBC
HCT: 31.2 % — ABNORMAL LOW (ref 36.0–46.0)
HEMOGLOBIN: 9.9 g/dL — AB (ref 12.0–15.0)
MCH: 25.3 pg — ABNORMAL LOW (ref 26.0–34.0)
MCHC: 31.7 g/dL (ref 30.0–36.0)
MCV: 79.6 fL (ref 78.0–100.0)
PLATELETS: 263 10*3/uL (ref 150–400)
RBC: 3.92 MIL/uL (ref 3.87–5.11)
RDW: 15.1 % (ref 11.5–15.5)
WBC: 15.5 10*3/uL — AB (ref 4.0–10.5)

## 2017-11-20 LAB — GLUCOSE, CAPILLARY
GLUCOSE-CAPILLARY: 160 mg/dL — AB (ref 70–99)
GLUCOSE-CAPILLARY: 134 mg/dL — AB (ref 70–99)
Glucose-Capillary: 135 mg/dL — ABNORMAL HIGH (ref 70–99)
Glucose-Capillary: 141 mg/dL — ABNORMAL HIGH (ref 70–99)

## 2017-11-20 LAB — HEPARIN LEVEL (UNFRACTIONATED)

## 2017-11-20 LAB — PROCALCITONIN: PROCALCITONIN: 0.76 ng/mL

## 2017-11-20 MED ORDER — RIVAROXABAN 20 MG PO TABS: 20.0000 mg | ORAL_TABLET | Freq: Every day | ORAL | Status: DC

## 2017-11-20 MED ORDER — RIVAROXABAN 15 MG PO TABS
15.0000 mg | ORAL_TABLET | Freq: Two times a day (BID) | ORAL | Status: DC
  Administered 2017-11-20 (×2): 15 mg via ORAL
  Filled 2017-11-20 (×2): qty 1

## 2017-11-20 NOTE — Evaluation (Addendum)
Physical Therapy Evaluation Patient Details Name: [name] MRN: [id number] DOB: Jul 08, 1955 Today's Date: 11/20/2017   History of Present Illness  Pt is a 62 y.o. F with significant PMH of multiple sclerosis, diabetes, hypertension, hyperlipidemia, and a recent hospitalization for PE and DVT, she was d/c on 11/15/2017 on Eliquis to [facility]. Presents secondary to tachycardia, noted to have SIRS, repeat CTA chest showing increased clot burden.  Clinical Impression  Patient evaluated by Physical Therapy with no further acute PT needs identified. Patient is presenting at baseline with mobility (bedbound). Oriented to self only. Requiring two person total assistance for all bed mobility and sitting balance. Displaying chronic knee flexion contractures. Recommended use of Maxi Move for staff to use for transferring patient. See below for any follow-up Physical Therapy needs. PT is signing off. Thank you for this referral.     Follow Up Recommendations SNF;Supervision/Assistance - 24 hour (return to [facility])    Equipment Recommendations  None recommended by PT    Recommendations for Other Services       Precautions / Restrictions Precautions Precautions: Fall Restrictions Weight Bearing Restrictions: No   (verbal confirmation by dr. Randol Kern that patient was not on strict bed rest)     Mobility  Bed Mobility Overal bed mobility: Needs Assistance Bed Mobility: Rolling;Supine to Sit;Sit to Supine Rolling: Total assist;+2 for physical assistance   Supine to sit: Total assist;+2 for physical assistance Sit to supine: Total assist;+2 for physical assistance   General bed mobility comments: requiring total assistance for all bed mobility  Transfers                 General transfer comment: not tested  Ambulation/Gait                Stairs            Wheelchair Mobility    Modified Rankin (Stroke Patients Only)       Balance Overall  balance assessment: Needs assistance Sitting-balance support: Bilateral upper extremity supported;Feet supported Sitting balance-Leahy Scale: Zero Sitting balance - Comments: Total assistance for sitting balance                                     Pertinent Vitals/Pain Pain Assessment: Faces Faces Pain Scale: No hurt    Home Living Family/patient expects to be discharged to:: Skilled nursing facility                 Additional Comments: [facility]    Prior Function Level of Independence: Needs assistance   Gait / Transfers Assistance Needed: per chart, patient is bedbound  ADL's / Homemaking Assistance Needed: assistance for ADL's at SNF        Hand Dominance        Extremity/Trunk Assessment   Upper Extremity Assessment Upper Extremity Assessment: Generalized weakness    Lower Extremity Assessment Lower Extremity Assessment: RLE deficits/detail;LLE deficits/detail RLE Deficits / Details: No active movement noted. LE externally rotated and flexed. Knee flexion contracture ~15 degrees from neutral LLE Deficits / Details: No active movement noted. LE externally rotated and flexed, PT attempting to fully extend LE, but unable. Knee flexion contracture ~15 degrees from neutral.       Communication   Communication: No difficulties  Cognition Arousal/Alertness: Awake/alert Behavior During Therapy: WFL for tasks assessed/performed Overall Cognitive Status: No family/caregiver present to determine baseline cognitive functioning  General Comments: Oriented to self only. Able to follow simple commands inconsistently.      General Comments      Exercises     Assessment/Plan    PT Assessment Patent does not need any further PT services  PT Problem List Decreased strength;Decreased range of motion;Decreased activity tolerance;Decreased balance;Decreased mobility;Decreased cognition;Decreased safety  awareness;Impaired sensation       PT Treatment Interventions      PT Goals (Current goals can be found in the Care Plan section)  Acute Rehab PT Goals Patient Stated Goal: none stated PT Goal Formulation: All assessment and education complete, DC therapy    Frequency     Barriers to discharge        Co-evaluation               AM-PAC PT "6 Clicks" Daily Activity  Outcome Measure Difficulty turning over in bed (including adjusting bedclothes, sheets and blankets)?: Unable Difficulty moving from lying on back to sitting on the side of the bed? : Unable Difficulty sitting down on and standing up from a chair with arms (e.g., wheelchair, bedside commode, etc,.)?: Unable Help needed moving to and from a bed to chair (including a wheelchair)?: Total Help needed walking in hospital room?: Total Help needed climbing 3-5 steps with a railing? : Total 6 Click Score: 6    End of Session   Activity Tolerance: Patient tolerated treatment well Patient left: in bed;with call bell/phone within reach;with nursing/sitter in room Nurse Communication: Mobility status PT Visit Diagnosis: Other abnormalities of gait and mobility (R26.89);Muscle weakness (generalized) (M62.81)    Time: 1610-9604 PT Time Calculation (min) (ACUTE ONLY): 20 min   Charges:   PT Evaluation $PT Eval Moderate Complexity: 1 Mod     PT G Codes:        Laurina Bustle, PT, DPT Acute Rehabilitation Services  Pager: [phone]  Vanetta Mulders 11/20/2017, 4:43 PM

## 2017-11-20 NOTE — Discharge Instructions (Addendum)

## 2017-11-20 NOTE — Progress Notes (Signed)
OT Cancellation Note  Patient Details Name: LAMEISHA SCHUENEMANN MRN: [id number] DOB: 01/24/1956   Cancelled Treatment:    Reason Eval/Treat Not Completed: Active bedrest order, will follow.   Marcy Siren, OT Pager [phone] 11/20/2017   Orlando Penner 11/20/2017, 9:21 AM

## 2017-11-20 NOTE — Progress Notes (Signed)
ANTICOAGULATION CONSULT NOTE  Pharmacy Consult:  Heparin Indication:  Recent PE and DVT  No Known Allergies  Patient Measurements: Height: 67 inches Weight: 75.9 kg Heparin Dosing Weight: 76 kg  Vital Signs: Temp: 98.6 F (37 C) (07/07 0832) Temp Source: Oral (07/07 0832) BP: 115/83 (07/07 0832) Pulse Rate: 96 (07/07 0832)  Labs: Recent Labs    11/17/17 1155 11/17/17 1602 11/17/17 1911  11/18/17 0731 11/18/17 1647 11/19/17 0034 11/19/17 0114 11/19/17 0731 11/19/17 1330 11/20/17 0534  HGB 11.8*  --  11.9*  --  11.5*  --  10.1*  --   --   --  9.9*  HCT 38.1  --  37.4  --  36.4  --  31.8*  --   --   --  31.2*  PLT 277  --  249  --  240  --  208  --   --   --  263  APTT  --   --  38*   < > 41* 39* 43*  --   --   --   --   LABPROT  --  14.8 15.5*  --   --   --   --   --   --   --   --   INR  --  1.17 1.24  --   --   --   --   --   --   --   --   HEPARINUNFRC  --   --   --    < > 0.75* 0.51 0.55  --   --   --  >2.20*  CREATININE 0.92  --   --   --  0.74  --  1.61*  --   --   --   --   TROPONINI  --   --   --   --   --   --   --  0.04* <0.03 0.03*  --    < > = values in this interval not displayed.    Estimated Creatinine Clearance: 38.7 mL/min (A) (by C-G formula based on SCr of 1.61 mg/dL (H)).  Assessment: 22 YOF recently diagnosed with right leg DVT and bilateral PE.  She was discharged on Eliquis but has not been receiving med- last dose was on 7/2 on day of discharge.  Transitioned to heparin and now to switch over to Xarelto before discharge. Since only received a few days of Eliquis treatment dose and now switching to Xarelto will give treatment dose of Xarelto. Hgb 9.9, plts wnl.  Goal of Therapy:  Monitor platelets by anticoagulation protocol: Yes   Plan:  Stop heparin gtt Start Xarelto 15mg  PO BID x 21 days, then start Xarelto 20mg  daily on 7/28 Monitor CBC, s/s of bleed  Enzo Bi, PharmD, BCPS Clinical Pharmacist Phone number [phone] 11/20/2017  8:51 AM

## 2017-11-20 NOTE — Progress Notes (Signed)
PROGRESS NOTE                                                                                                                                                                                                             Patient Demographics:    [name], is a 62 y.o. female, DOB - Mar 07, 1956, UPJ:[id number]  Admit date - 11/17/2017   Admitting Physician Karmen Bongo, MD  Outpatient Primary MD for the patient is Reymundo Poll, MD  LOS - 3   No chief complaint on file.      Brief Narrative    62 y.o. female with medical history significant for multiple sclerosis bedbound, diabetes, hypertension, hyperlipidemia, and a recent hospitalization  for PE and DVT, she was discharged on 11/15/2017 on Eliquis to ALF, unclear at this point if she was taking her Eliquis or not, she presents secondary to tachycardia, she was needed noted to have SIRS, repeat CTA chest showing with increased clot burden .   Subjective:    [name] today denies any complaints .    Assessment  & Plan :    Active Problems:   MS (multiple sclerosis) (HCC)   Fever   DM2 (diabetes mellitus, type 2) (HCC)   Encephalopathy, metabolic   Depression   Pyelonephritis, acute   Pulmonary embolism (HCC)   DVT (deep venous thrombosis) Rt Leg   Leukocytosis   Sepsis (HCC)  SIRS -SIRS criteria met on admission, leukocytosis, fever, tachycardia and tachypnea, and elevated lactic acid, so far there is no clear infectious etiology, CT chest abdomen pelvis with no evidence of infectious etiology, she has negative urine analysis, blood cultures remain negative, empirically on vancomycin and Zosyn, appears to be improving, leukocytosis trending down, afebrile over the last 48 hours I will discontinue start IV vancomycin 11/20/2015, continue with IV Zosyn, procalcitonin trending up initially, but patient appears to be improving, nontoxic looking, continue to monitor .  Sinus  tachycardia -Improving after giving some fluids, and increasing her Toprol-XL  PE -Patient with recent diagnosis of PE, she was discharged on Eliquis, she was discharged to an assisted living facility, case manager confirmed, patient was taking her Eliquis at facility, appears her PE has progressed despite being on Eliquis, kept on heparin GTT for last 72 hours, she will be transitioned to Xarelto today .  AKI -Creatinine increased  1.6 yesterday, this is most likely in the setting of contrast nephropathy as she had CTA chest on admission, I will discontinue lisinopril, avoid nephrotoxic medication, continue with gentle hydration, check urine sodium and repeat BMP in a.m.  Type 2 diabetes mellitus -Continue with insulin sliding scale  Hypertension -Meds labile, but overall much improved after increasing her Toprol-XL from 50 to 75 mg daily   Hyperlipidemia Continue home statins  Anemia of chronic disease,  -Continue to monitor H&H closely as she is on anticoagulation   History of MS,  - with neuromuscular deficits, the patient does interferon beta injections 30 mcg q. 7 days, supportive care, home health nursing and physical therapy Follow up with neurology as outpatient       Code Status : DNR  Family Communication  : none at bedside  Disposition Plan  : Alf when stable  Consults  :  none  Procedures  : none  DVT Prophylaxis  :  On heparin GTT  Lab Results  Component Value Date   PLT 263 11/20/2017    Antibiotics  :    Anti-infectives (From admission, onward)   Start     Dose/Rate Route Frequency Ordered Stop   11/20/17 0230  vancomycin (VANCOCIN) IVPB 1000 mg/200 mL premix  Status:  Discontinued     1,000 mg 200 mL/hr over 60 Minutes Intravenous Every 24 hours 11/19/17 1138 11/19/17 1609   11/18/17 0200  vancomycin (VANCOCIN) IVPB 1000 mg/200 mL premix  Status:  Discontinued     1,000 mg 200 mL/hr over 60 Minutes Intravenous Every 12 hours 11/17/17 1429  11/19/17 1138   11/17/17 2200  piperacillin-tazobactam (ZOSYN) IVPB 3.375 g     3.375 g 12.5 mL/hr over 240 Minutes Intravenous Every 8 hours 11/17/17 1429     11/17/17 1300  piperacillin-tazobactam (ZOSYN) IVPB 3.375 g     3.375 g 100 mL/hr over 30 Minutes Intravenous  Once 11/17/17 1255 11/17/17 1352   11/17/17 1300  vancomycin (VANCOCIN) IVPB 1000 mg/200 mL premix     1,000 mg 200 mL/hr over 60 Minutes Intravenous  Once 11/17/17 1255 11/17/17 1501        Objective:   Vitals:   11/20/17 0416 11/20/17 0832 11/20/17 1328 11/20/17 1529  BP:  115/83 (!) 143/90   Pulse:  96 89   Resp:  (!) 25 (!) 21   Temp: 98.7 F (37.1 C) 98.6 F (37 C)  98.3 F (36.8 C)  TempSrc: Oral Oral  Oral  SpO2:  94% (!) 89%   Weight:        Wt Readings from Last 3 Encounters:  11/19/17 76.5 kg (168 lb 10.4 oz)  11/13/17 75.9 kg (167 lb 5.3 oz)  01/21/17 78.9 kg (174 lb)    No intake or output data in the 24 hours ending 11/20/17 1607   Physical Exam  Awake Alert, frail elderly female, laying in bed in no apparent distress Good air entry bilaterally, clear to auscultation, no wheezing  Regular rate and rhythm, no rubs murmurs gallops Abdomen soft, nontender, nondistended, bowel sounds present No edema or clubbing or cyanosis, and left arm area there is a small area of swelling most likely related to small hematoma     Data Review:    CBC Recent Labs  Lab 11/17/17 1155 11/17/17 1911 11/18/17 0731 11/19/17 0034 11/20/17 0534  WBC 22.1* 21.8* 19.6* 17.4* 15.5*  HGB 11.8* 11.9* 11.5* 10.1* 9.9*  HCT 38.1 37.4 36.4 31.8* 31.2*  PLT 277 249  240 208 263  MCV 81.4 79.9 80.0 79.3 79.6  MCH 25.2* 25.4* 25.3* 25.2* 25.3*  MCHC 31.0 31.8 31.6 31.8 31.7  RDW 15.1 15.0 15.0 14.8 15.1  LYMPHSABS 1.7 1.8  --   --   --   MONOABS 2.5* 2.4*  --   --   --   EOSABS 0.0 0.0  --   --   --   BASOSABS 0.1 0.1  --   --   --     Chemistries  Recent Labs  Lab 11/14/17 0527 11/17/17 1155  11/18/17 0731 11/19/17 0034  NA 139 142 143 139  K 3.3* 3.7 3.5 3.8  CL 108 109 108 107  CO2 20* _0 GLUCOSE 125* 221* 160* 168*  BUN _1 CREATININE 0.64 0.92 0.74 1.61*  CALCIUM 8.4* 9.1 8.9 8.3*  AST  --  42*  --   --   ALT  --  58*  --   --   ALKPHOS  --  70  --   --   BILITOT  --  0.7  --   --    ------------------------------------------------------------------------------------------------------------------ No results for input(s): CHOL, HDL, LDLCALC, TRIG, CHOLHDL, LDLDIRECT in the last 72 hours.  Lab Results  Component Value Date   HGBA1C 6.1 (H) 12/29/2011   ------------------------------------------------------------------------------------------------------------------ No results for input(s): TSH, T4TOTAL, T3FREE, THYROIDAB in the last 72 hours.  Invalid input(s): FREET3 ------------------------------------------------------------------------------------------------------------------ No results for input(s): VITAMINB12, FOLATE, FERRITIN, TIBC, IRON, RETICCTPCT in the last 72 hours.  Coagulation profile Recent Labs  Lab 11/17/17 1602 11/17/17 1911  INR 1.17 1.24    No results for input(s): DDIMER in the last 72 hours.  Cardiac Enzymes Recent Labs  Lab 11/19/17 0114 11/19/17 0731 11/19/17 1330  TROPONINI 0.04* <0.03 0.03*   ------------------------------------------------------------------------------------------------------------------ No results found for: BNP  Inpatient Medications  Scheduled Meds: . aspirin  81 mg Oral Daily  . atorvastatin  20 mg Oral q1800  . cholecalciferol  800 Units Oral Daily  . cloNIDine  0.1 mg Oral BID  . ferrous sulfate  325 mg Oral Q breakfast  . gabapentin  300 mg Oral Q12H  . hydroxypropyl methylcellulose / hypromellose  1 drop Both Eyes BID  . insulin aspart  0-9 Units Subcutaneous TID WC  . latanoprost  1 drop Both Eyes QHS  . mouth rinse  15 mL Mouth Rinse BID  . metoprolol succinate  75 mg  Oral Daily  . polyethylene glycol  17 g Oral Daily  . rivaroxaban  15 mg Oral BID   Followed by  . [START ON 12/11/2017] rivaroxaban  20 mg Oral Q supper   Continuous Infusions: . piperacillin-tazobactam (ZOSYN)  IV 3.375 g (11/20/17 1257)   PRN Meds:.acetaminophen **OR** acetaminophen, bisacodyl, diphenhydrAMINE, hydrALAZINE, HYDROcodone-acetaminophen, nitroGLYCERIN, ondansetron **OR** ondansetron (ZOFRAN) IV, senna-docusate, zinc oxide  Micro Results Recent Results (from the past 240 hour(s))  MRSA PCR Screening     Status: None   Collection Time: 11/13/17  2:53 PM  Result Value Ref Range Status   MRSA by PCR NEGATIVE NEGATIVE Final    Comment:        The GeneXpert MRSA Assay (FDA approved for NASAL specimens only), is one component of a comprehensive MRSA colonization surveillance program. It is not intended to diagnose MRSA infection nor to guide or monitor treatment for MRSA infections. Performed at [facility], [place] [address], [place], [place] [address]   Blood Culture (routine x 2)  Status: None (Preliminary result)   Collection Time: 11/17/17 12:33 PM  Result Value Ref Range Status   Specimen Description BLOOD LEFT ANTECUBITAL  Final   Special Requests   Final    BOTTLES DRAWN AEROBIC AND ANAEROBIC Blood Culture results may not be optimal due to an inadequate volume of blood received in culture bottles   Culture   Final    NO GROWTH 3 DAYS Performed at [facility], [place] [address], [place], [place] [address]    Report Status PENDING  Incomplete  Blood Culture (routine x 2)     Status: None (Preliminary result)   Collection Time: 11/17/17  1:16 PM  Result Value Ref Range Status   Specimen Description BLOOD RIGHT HAND  Final   Special Requests   Final    BOTTLES DRAWN AEROBIC AND ANAEROBIC Blood Culture adequate volume   Culture   Final    NO GROWTH 3 DAYS Performed at [facility], [place] [address], [place], [place] [address]    Report  Status PENDING  Incomplete  Culture, blood (x 2)     Status: None (Preliminary result)   Collection Time: 11/17/17  4:16 PM  Result Value Ref Range Status   Specimen Description BLOOD LEFT ANTECUBITAL  Final   Special Requests   Final    BOTTLES DRAWN AEROBIC AND ANAEROBIC Blood Culture results may not be optimal due to an inadequate volume of blood received in culture bottles   Culture   Final    NO GROWTH 3 DAYS Performed at [facility], [place] [address] [place], [place] [address]    Report Status PENDING  Incomplete  Culture, blood (x 2)     Status: None (Preliminary result)   Collection Time: 11/17/17  4:21 PM  Result Value Ref Range Status   Specimen Description BLOOD RIGHT HAND  Final   Special Requests   Final    BOTTLES DRAWN AEROBIC AND ANAEROBIC Blood Culture adequate volume   Culture   Final    NO GROWTH 3 DAYS Performed at [facility], [place] [address] [place], [place] [address]    Report Status PENDING  Incomplete    Radiology Reports Ct Abdomen Pelvis Wo Contrast  Result Date: 11/17/2017 CLINICAL DATA:  62 year old female with acute abdominal pain and diarrhea. EXAM: CT ABDOMEN AND PELVIS WITHOUT CONTRAST TECHNIQUE: Multidetector CT imaging of the abdomen and pelvis was performed following the standard protocol without IV contrast. COMPARISON:  11/12/2017 CT FINDINGS: Please note that parenchymal abnormalities may be missed without intravenous contrast. Lower chest: Bibasilar atelectasis again noted. Elevated LEFT hemidiaphragm again noted. Hepatobiliary: The liver and gallbladder are unremarkable. No definite biliary dilatation. Pancreas: Unremarkable Spleen: Unremarkable Adrenals/Urinary Tract: The kidneys, adrenal glands and bladder are unremarkable. Stomach/Bowel: Stomach is within normal limits. No evidence of bowel wall thickening, distention, or inflammatory changes. Vascular/Lymphatic: Aortic atherosclerosis. No enlarged abdominal or pelvic lymph nodes.  Reproductive: Status post hysterectomy. No adnexal masses. Other: No ascites, focal collection or pneumoperitoneum. Musculoskeletal: No acute or suspicious bony abnormality. Apex LEFT lumbar scoliosis and moderate degenerative disc disease at L3-4 again identified. IMPRESSION: 1. No evidence of acute abnormality within the abdomen or pelvis. 2. Bibasilar atelectasis again noted 3.  Aortic Atherosclerosis (ICD10-I70.0). Electronically Signed   By: Margarette Canada M.D.   On: 11/17/2017 15:39   Dg Chest 2 View  Result Date: 11/12/2017 CLINICAL DATA:  Arm pain. EXAM: CHEST - 2 VIEW COMPARISON:  Chest x-ray dated  December 28, 2011. FINDINGS: The heart size and mediastinal contours are within normal limits. Normal pulmonary vascularity. No focal consolidation, pleural effusion, or pneumothorax. Unchanged elevation of the left hemidiaphragm. No acute osseous abnormality. IMPRESSION: No active cardiopulmonary disease. Electronically Signed   By: Titus Dubin M.D.   On: 11/12/2017 19:56   Ct Angio Chest Pe W Or Wo Contrast  Result Date: 11/17/2017 CLINICAL DATA:  Tachycardia.  Recent PE/DVT. EXAM: CT ANGIOGRAPHY CHEST WITH CONTRAST TECHNIQUE: Multidetector CT imaging of the chest was performed using the standard protocol during bolus administration of intravenous contrast. Multiplanar CT image reconstructions and MIPs were obtained to evaluate the vascular anatomy. CONTRAST:  18m ISOVUE-370 IOPAMIDOL (ISOVUE-370) INJECTION 76% COMPARISON:  11/12/2016 FINDINGS: Cardiovascular: Heart is normal in size. Calcified plaque over the left anterior descending and right coronary arteries. Mild calcified plaque over the thoracic aorta. Pulmonary arterial system is well opacified demonstrates no significant change in moderate emboli over the right lower lobar pulmonary arteries with slight improvement in the proximal right upper lobar arteries and emboli over the right middle lobar pulmonary artery. New pulmonary emboli over the  left upper lobe and proximal left lower lobar pulmonary arteries. RV/LV ratio is 33.1/35.4 is 0.93. Mediastinum/Nodes: No mediastinal or hilar adenopathy. Remaining mediastinal structures are within normal. Lungs/Pleura: Elevated left hemidiaphragm with slight worsening opacification left base likely atelectasis. Right lung is clear. Airways are normal. Upper Abdomen: No acute findings. Calcified plaque over the abdominal aorta. Musculoskeletal: Unchanged. Review of the MIP images confirms the above findings. IMPRESSION: Known right-sided pulmonary emboli without significant overall change and new pulmonary emboli over the proximal right upper and lower lobar pulmonary arteries. Positive for acute PE with CT evidence of right heart strain (RV/LV Ratio = 0.93) consistent with at least submassive (intermediate risk) PE. The presence of right heart strain has been associated with an increased risk of morbidity and mortality. Please activate Code PE by paging [phone] Slight worsening opacification left base likely atelectasis. Atherosclerotic coronary artery disease. Aortic Atherosclerosis (ICD10-I70.0). These results will be called to the ordering clinician or representative by the Radiologist Assistant, and communication documented in the PACS or zVision Dashboard. Electronically Signed   By: DMarin OlpM.D.   On: 11/17/2017 22:15   Ct Angio Chest Pe W/cm &/or Wo Cm  Result Date: 11/12/2017 CLINICAL DATA:  Right cheek and arm cramping. Chest pain and tachycardia. EXAM: CT ANGIOGRAPHY CHEST CT ABDOMEN AND PELVIS WITH CONTRAST TECHNIQUE: Multidetector CT imaging of the chest was performed using the standard protocol during bolus administration of intravenous contrast. Multiplanar CT image reconstructions and MIPs were obtained to evaluate the vascular anatomy. Multidetector CT imaging of the abdomen and pelvis was performed using the standard protocol during bolus administration of intravenous contrast.  CONTRAST:  1055mISOVUE-300 IOPAMIDOL (ISOVUE-300) INJECTION 61% COMPARISON:  CXR 11/12/2017 FINDINGS: CTA CHEST FINDINGS Cardiovascular: Acute pulmonary emboli at the branch point of the right main pulmonary embolus extending into the right lobar level in the upper lobe and segmental level to the right lower lobe. RV/LV ratio elevated at 0.79. Left ventricular hypertrophy is seen. No pericardial effusion. No aortic aneurysm or dissection. Mild ectasia of the ascending aorta to 3.7 cm. Common branch point of the right brachiocephalic and left common carotid arteries without stenosis of the great vessels. Coronary arteriosclerosis is noted along the LAD. Mediastinum/Nodes: No mediastinal nor hilar adenopathy. Trachea is unremarkable. Mainstem bronchi are patent. Lungs/Pleura: Dependent atelectasis. No acute pulmonary abnormality. No effusion or pneumothorax. No dominant mass.  Elevated left hemidiaphragm. Musculoskeletal: No chest wall abnormality. No acute or significant osseous findings. Review of the MIP images confirms the above findings. CT ABDOMEN and PELVIS FINDINGS Hepatobiliary: No focal liver abnormality is seen. No gallstones, gallbladder wall thickening, or biliary dilatation. Pancreas: Unremarkable. No pancreatic ductal dilatation or surrounding inflammatory changes. Spleen: Normal size spleen without mass. Punctate calcification consistent with a tiny granuloma is noted within. Adrenals/Urinary Tract: Normal bilateral adrenal glands. Symmetric cortical enhancement of kidneys without enhancing mass lesions. No nephrolithiasis nor hydroureteronephrosis. The urinary bladder is unremarkable for the degree of distention. Stomach/Bowel: Small hiatal hernia. Decompressed stomach with normal small bowel rotation. No bowel obstruction or inflammation. The terminal and distal ileum are normal. The appendix is not well visualized but no pericecal inflammatory change is seen. Moderate stool retention is noted within  colon. Vascular/Lymphatic: Moderate aortoiliac atherosclerosis without aneurysm. No adenopathy. Reproductive: Hysterectomy.  No adnexal mass. Other: Tiny fat containing umbilical hernia. No free air nor free fluid. Musculoskeletal: Degenerative disc disease L3-4. No acute osseous abnormality. Review of the MIP images confirms the above findings. IMPRESSION: Chest CT: 1. Positive for acute PE to the right upper and lower lobes with CT evidence of right heart strain (RV/LV Ratio = 0.78) consistent with at least submassive (intermediate risk) PE. The presence of right heart strain has been associated with an increased risk of morbidity and mortality. Please activate Code PE by paging [phone]. These results were called by telephone at the time of interpretation on 11/12/2017 at 11:43 pm to PA [facility] , who verbally acknowledged these results. 2. No active pulmonary disease. 3. Coronary arteriosclerosis along the LAD. 4. Mild aortic atherosclerosis without aneurysm or dissection. CT AP: 1. No acute solid nor hollow visceral organ pathology. 2. Degenerative disc disease L3-4. Electronically Signed   By: Ashley Royalty M.D.   On: 11/12/2017 23:44   Ct Abdomen Pelvis W Contrast  Result Date: 11/12/2017 CLINICAL DATA:  Right cheek and arm cramping. Chest pain and tachycardia. EXAM: CT ANGIOGRAPHY CHEST CT ABDOMEN AND PELVIS WITH CONTRAST TECHNIQUE: Multidetector CT imaging of the chest was performed using the standard protocol during bolus administration of intravenous contrast. Multiplanar CT image reconstructions and MIPs were obtained to evaluate the vascular anatomy. Multidetector CT imaging of the abdomen and pelvis was performed using the standard protocol during bolus administration of intravenous contrast. CONTRAST:  170m ISOVUE-300 IOPAMIDOL (ISOVUE-300) INJECTION 61% COMPARISON:  CXR 11/12/2017 FINDINGS: CTA CHEST FINDINGS Cardiovascular: Acute pulmonary emboli at the branch point of the right main  pulmonary embolus extending into the right lobar level in the upper lobe and segmental level to the right lower lobe. RV/LV ratio elevated at 0.79. Left ventricular hypertrophy is seen. No pericardial effusion. No aortic aneurysm or dissection. Mild ectasia of the ascending aorta to 3.7 cm. Common branch point of the right brachiocephalic and left common carotid arteries without stenosis of the great vessels. Coronary arteriosclerosis is noted along the LAD. Mediastinum/Nodes: No mediastinal nor hilar adenopathy. Trachea is unremarkable. Mainstem bronchi are patent. Lungs/Pleura: Dependent atelectasis. No acute pulmonary abnormality. No effusion or pneumothorax. No dominant mass. Elevated left hemidiaphragm. Musculoskeletal: No chest wall abnormality. No acute or significant osseous findings. Review of the MIP images confirms the above findings. CT ABDOMEN and PELVIS FINDINGS Hepatobiliary: No focal liver abnormality is seen. No gallstones, gallbladder wall thickening, or biliary dilatation. Pancreas: Unremarkable. No pancreatic ductal dilatation or surrounding inflammatory changes. Spleen: Normal size spleen without mass. Punctate calcification consistent with a tiny granuloma is noted  within. Adrenals/Urinary Tract: Normal bilateral adrenal glands. Symmetric cortical enhancement of kidneys without enhancing mass lesions. No nephrolithiasis nor hydroureteronephrosis. The urinary bladder is unremarkable for the degree of distention. Stomach/Bowel: Small hiatal hernia. Decompressed stomach with normal small bowel rotation. No bowel obstruction or inflammation. The terminal and distal ileum are normal. The appendix is not well visualized but no pericecal inflammatory change is seen. Moderate stool retention is noted within colon. Vascular/Lymphatic: Moderate aortoiliac atherosclerosis without aneurysm. No adenopathy. Reproductive: Hysterectomy.  No adnexal mass. Other: Tiny fat containing umbilical hernia. No free air  nor free fluid. Musculoskeletal: Degenerative disc disease L3-4. No acute osseous abnormality. Review of the MIP images confirms the above findings. IMPRESSION: Chest CT: 1. Positive for acute PE to the right upper and lower lobes with CT evidence of right heart strain (RV/LV Ratio = 0.78) consistent with at least submassive (intermediate risk) PE. The presence of right heart strain has been associated with an increased risk of morbidity and mortality. Please activate Code PE by paging [phone]. These results were called by telephone at the time of interpretation on 11/12/2017 at 11:43 pm to PA [facility] , who verbally acknowledged these results. 2. No active pulmonary disease. 3. Coronary arteriosclerosis along the LAD. 4. Mild aortic atherosclerosis without aneurysm or dissection. CT AP: 1. No acute solid nor hollow visceral organ pathology. 2. Degenerative disc disease L3-4. Electronically Signed   By: Ashley Royalty M.D.   On: 11/12/2017 23:44   Dg Chest Port 1 View  Result Date: 11/17/2017 CLINICAL DATA:  Fever EXAM: PORTABLE CHEST 1 VIEW COMPARISON:  CT chest 11/12/2017.  Chest x-ray 11/12/2017. FINDINGS: 1255 hours. The cardiopericardial silhouette is within normal limits for size. The visualized bony structures of the thorax are intact. Telemetry leads overlie the chest. Fairly prominent gaseous distention of colon visualized in the upper abdomen. Marked asymmetric elevation left hemidiaphragm with adjacent atelectasis in the left lung base. IMPRESSION: Asymmetric elevation left hemidiaphragm with adjacent left base atelectasis. Diffuse gaseous distention of colon within the visualized upper abdomen. Electronically Signed   By: Misty Stanley M.D.   On: 11/17/2017 13:07     Phillips Climes M.D on 11/20/2017 at 4:07 PM  Between 7am to 7pm - Pager - [phone]  After 7pm go to [email or web address] - password [facility]  [facility] -  Office  [phone]

## 2017-11-21 LAB — BASIC METABOLIC PANEL
Anion gap: 8 (ref 5–15)
BUN: 36 mg/dL — AB (ref 8–23)
CHLORIDE: 109 mmol/L (ref 98–111)
CO2: 24 mmol/L (ref 22–32)
CREATININE: 2.99 mg/dL — AB (ref 0.44–1.00)
Calcium: 8.4 mg/dL — ABNORMAL LOW (ref 8.9–10.3)
GFR calc Af Amer: 18 mL/min — ABNORMAL LOW (ref >60)
GFR calc non Af Amer: 16 mL/min — ABNORMAL LOW (ref >60)
Glucose, Bld: 131 mg/dL — ABNORMAL HIGH (ref 70–99)
Potassium: 3.8 mmol/L (ref 3.5–5.1)
Sodium: 141 mmol/L (ref 135–145)

## 2017-11-21 LAB — CBC
HEMATOCRIT: 31.2 % — AB (ref 36.0–46.0)
HEMOGLOBIN: 10 g/dL — AB (ref 12.0–15.0)
MCH: 25.3 pg — AB (ref 26.0–34.0)
MCHC: 32.1 g/dL (ref 30.0–36.0)
MCV: 79 fL (ref 78.0–100.0)
Platelets: 288 10*3/uL (ref 150–400)
RBC: 3.95 MIL/uL (ref 3.87–5.11)
RDW: 15.1 % (ref 11.5–15.5)
WBC: 10.6 10*3/uL — ABNORMAL HIGH (ref 4.0–10.5)

## 2017-11-21 LAB — GLUCOSE, CAPILLARY
GLUCOSE-CAPILLARY: 131 mg/dL — AB (ref 70–99)
GLUCOSE-CAPILLARY: 128 mg/dL — AB (ref 70–99)
Glucose-Capillary: 163 mg/dL — ABNORMAL HIGH (ref 70–99)

## 2017-11-21 LAB — SODIUM, URINE, RANDOM: Sodium, Ur: 54 mmol/L

## 2017-11-21 LAB — HEPARIN LEVEL (UNFRACTIONATED): Heparin Unfractionated: 0.95 IU/mL — ABNORMAL HIGH (ref 0.30–0.70)

## 2017-11-21 LAB — APTT: aPTT: 74 seconds — ABNORMAL HIGH (ref 24–36)

## 2017-11-21 MED ORDER — SODIUM CHLORIDE 0.9 % IV SOLN
INTRAVENOUS | Status: DC
  Administered 2017-11-21 – 2017-11-23 (×3): via INTRAVENOUS

## 2017-11-21 MED ORDER — HEPARIN (PORCINE) IN NACL 100-0.45 UNIT/ML-% IJ SOLN
1050.0000 [IU]/h | INTRAMUSCULAR | Status: DC
  Administered 2017-11-21: 1000 [IU]/h via INTRAVENOUS
  Administered 2017-11-22: 1100 [IU]/h via INTRAVENOUS
  Administered 2017-11-23 – 2017-11-24 (×2): 1050 [IU]/h via INTRAVENOUS
  Filled 2017-11-21 (×4): qty 250

## 2017-11-21 NOTE — Care Management Important Message (Signed)
Important Message  Patient Details  Name: [name] MRN: [id number] Date of Birth: August 28, 1955   Medicare Important Message Given:  No  Due to illness patient did not sign/ Unsigned copy left at bedside  Dorena Bodo 11/21/2017, 4:23 PM

## 2017-11-21 NOTE — Evaluation (Addendum)
Occupational Therapy Evaluation and Discharge Patient Details Name: [name] MRN: [id number] DOB: October 24, 1955 Today's Date: 11/21/2017    History of Present Illness Pt is a 62 y.o. F with significant PMH of multiple sclerosis, diabetes, hypertension, hyperlipidemia, and a recent hospitalization for PE and DVT, she was d/c on 11/15/2017 on Eliquis to [facility]. Presents secondary to tachycardia, noted to have SIRS, repeat CTA chest showing increased clot burden.   Clinical Impression   This 62 yo female admitted with above presents to acute OT at what appears to be her baseline level of functioning for basic ADLs (needs total A for LBADLs and Mod A at most for UBADLs in supported sitting position. She may benefit from continued OT at SNF--defer to their recommendation since they are already familiar with pt. Acute OT will sign off.     Follow Up Recommendations  SNF;Supervision/Assistance - 24 hour          Precautions / Restrictions Precautions Precautions: Fall Restrictions Weight Bearing Restrictions: No             ADL either performed or assessed with clinical judgement   ADL Overall ADL's : Needs assistance/impaired Eating/Feeding: Independent;Bed level   Grooming: Set up;Bed level   Upper Body Bathing: Set up;Bed level   Lower Body Bathing: Total assistance;Bed level   Upper Body Dressing : Moderate assistance;Bed level   Lower Body Dressing: Total assistance;Bed level     Toilet Transfer Details (indicate cue type and reason): pt requires lift for all transfers                 Vision Patient Visual Report: No change from baseline              Pertinent Vitals/Pain Pain Assessment: No/denies pain     Hand Dominance Right   Extremity/Trunk Assessment Upper Extremity Assessment Upper Extremity Assessment: Overall WFL for tasks assessed           Communication Communication Communication: No difficulties   Cognition  Arousal/Alertness: Awake/alert Behavior During Therapy: WFL for tasks assessed/performed Overall Cognitive Status: Within Functional Limits for tasks assessed                                                Home Living Family/patient expects to be discharged to:: Skilled nursing facility                                 Additional Comments: [facility]      Prior Functioning/Environment Level of Independence: Needs assistance  Gait / Transfers Assistance Needed: per chart, patient is bedbound ADL's / Homemaking Assistance Needed: assistance for ADL's at SNF--pt can do her UBADLs but not her LBADLs            OT Problem List: Obesity;Impaired balance (sitting and/or standing) per PT eval--not tested this session due to pt's baseline status per PT;Decreased range of motion(Bil LEs)         OT Goals(Current goals can be found in the care plan section) Acute Rehab OT Goals Patient Stated Goal: to do what I can for myself  OT Frequency:                AM-PAC PT "6 Clicks" Daily Activity     Outcome Measure  Help from another person eating meals?: None Help from another person taking care of personal grooming?: A Little Help from another person toileting, which includes using toliet, bedpan, or urinal?: Total Help from another person bathing (including washing, rinsing, drying)?: A Lot Help from another person to put on and taking off regular upper body clothing?: A Lot Help from another person to put on and taking off regular lower body clothing?: Total 6 Click Score: 13   End of Session Nurse Communication: (Left message with covering nurse (pt's RN at lunch) about bandage on pt's left upper arm that is hard with dry blood)  Activity Tolerance: Patient tolerated treatment well Patient left: in bed;with call bell/phone within reach  OT Visit Diagnosis: Other abnormalities of gait and mobility (R26.89)                Time: 1610-9604 OT  Time Calculation (min): 8 min Charges:  OT General Charges $OT Visit: 1 Visit OT Evaluation $OT Eval Low Complexity: 1 Low Ignacia Palma, OTR/L [phone] 11/21/2017

## 2017-11-21 NOTE — Clinical Social Work Note (Signed)
Clinical Social Work Assessment  Patient Details  Name: [name] MRN: [id number] Date of Birth: 1955-06-15  Date of referral:  11/21/17               Reason for consult:  Discharge Planning                Permission sought to share information with:  Facility Medical sales representative, Family Supports Permission granted to share information::  No  Name::     Forensic scientist::  [facility]  Relationship::  Sister  Contact Information:  [phone]  Housing/Transportation Living arrangements for the past 2 months:  Assisted Living Facility Source of Information:  Facility, Siblings Patient Interpreter Needed:  None Criminal Activity/Legal Involvement Pertinent to Current Situation/Hospitalization:  No - Comment as needed Significant Relationships:  Siblings Lives with:  Facility Resident Do you feel safe going back to the place where you live?  Yes Need for family participation in patient care:  Yes (Comment)  Care giving concerns:  CSW received consult for possible SNF placement at time of discharge. CSW spoke with patient's sister regarding discharge plan. Patient resides at [facility] and will return at discharge. CSW to continue to follow and assist with discharge planning needs.   Social Worker assessment / plan:  CSW spoke with patient's ALF concerning discharge plan.   Employment status:  Disabled (Comment on whether or not currently receiving Disability) Insurance information:  Medicare PT Recommendations:  Skilled Nursing Facility Information / Referral to community resources:     Patient/Family's Response to care:  Patient's sister reports that we should alert [facility] about her discharge needs.   Patient/Family's Understanding of and Emotional Response to Diagnosis, Current Treatment, and Prognosis:  Patient/family is realistic regarding therapy needs and expressed being hopeful for return to ALF. Patient's sister expressed understanding of CSW role and  discharge process as well as medical condition. No questions/concerns about plan or treatment.    Emotional Assessment Appearance:  Appears stated age Attitude/Demeanor/Rapport:  Unable to Assess Affect (typically observed):  Unable to Assess Orientation:  Oriented to Self, Oriented to Place Alcohol / Substance use:  Not Applicable Psych involvement (Current and /or in the community):  No (Comment)  Discharge Needs  Concerns to be addressed:  Care Coordination Readmission within the last 30 days:  No Current discharge risk:  None Barriers to Discharge:  Continued Medical Work up   [employer], LCSWA 11/21/2017, 3:26 PM

## 2017-11-21 NOTE — Progress Notes (Deleted)
PROGRESS NOTE                                                                                                                                                                                                             Patient Demographics:    [name], is a 62 y.o. female, DOB - 16-May-1956, OOI:[id number]  Admit date - 11/17/2017   Admitting Physician Karmen Bongo, MD  Outpatient Primary MD for the patient is Reymundo Poll, MD  LOS - 4   No chief complaint on file.      Brief Narrative    62 y.o. female with medical history significant for multiple sclerosis bedbound, diabetes, hypertension, hyperlipidemia, and a recent hospitalization  for PE and DVT, she was discharged on 11/15/2017 on Eliquis to ALF, unclear at this point if she was taking her Eliquis or not, she presents secondary to tachycardia, she was needed noted to have SIRS, repeat CTA chest showing with increased clot burden .   Subjective:    [name] today denies any complaints .    Assessment  & Plan :    Active Problems:   MS (multiple sclerosis) (HCC)   Fever   DM2 (diabetes mellitus, type 2) (HCC)   Encephalopathy, metabolic   Depression   Pyelonephritis, acute   Pulmonary embolism (HCC)   DVT (deep venous thrombosis) Rt Leg   Leukocytosis   Sepsis (HCC)  SIRS -SIRS criteria met on admission, leukocytosis, fever, tachycardia and tachypnea, and elevated lactic acid, so far there is no clear infectious etiology, CT chest abdomen pelvis with no evidence of infectious etiology, she has negative urine analysis, blood cultures remain negative, empirically on vancomycin and Zosyn, appears to be improving, leukocytosis trending down, afebrile over the last 48 hours I will discontinue start IV vancomycin 11/20/2015, continue with IV Zosyn, procalcitonin trending up initially, but patient appears to be improving, nontoxic looking, continue to monitor .  Sinus  tachycardia -Improving after giving some fluids, and increasing her Toprol-XL  PE -Patient with recent diagnosis of PE, she was discharged on Eliquis, she was discharged to an assisted living facility, case manager confirmed, patient was taking her Eliquis at facility, appears her PE has progressed despite being on Eliquis, kept on heparin GTT for last 72 hours, she will be transitioned to Xarelto today .  AKI -Creatinine increased  1.6 yesterday, this is most likely in the setting of contrast nephropathy as she had CTA chest on admission, I will discontinue lisinopril, avoid nephrotoxic medication, continue with gentle hydration, check urine sodium and repeat BMP in a.m.  Type 2 diabetes mellitus -Continue with insulin sliding scale  Hypertension -Meds labile, but overall much improved after increasing her Toprol-XL from 50 to 75 mg daily   Hyperlipidemia Continue home statins  Anemia of chronic disease,  -Continue to monitor H&H closely as she is on anticoagulation   History of MS,  - with neuromuscular deficits, the patient does interferon beta injections 30 mcg q. 7 days, supportive care, home health nursing and physical therapy Follow up with neurology as outpatient       Code Status : DNR  Family Communication  : none at bedside  Disposition Plan  : Alf when stable  Consults  :  none  Procedures  : none  DVT Prophylaxis  :  On heparin GTT  Lab Results  Component Value Date   PLT 288 11/21/2017    Antibiotics  :    Anti-infectives (From admission, onward)   Start     Dose/Rate Route Frequency Ordered Stop   11/20/17 0230  vancomycin (VANCOCIN) IVPB 1000 mg/200 mL premix  Status:  Discontinued     1,000 mg 200 mL/hr over 60 Minutes Intravenous Every 24 hours 11/19/17 1138 11/19/17 1609   11/18/17 0200  vancomycin (VANCOCIN) IVPB 1000 mg/200 mL premix  Status:  Discontinued     1,000 mg 200 mL/hr over 60 Minutes Intravenous Every 12 hours 11/17/17 1429  11/19/17 1138   11/17/17 2200  piperacillin-tazobactam (ZOSYN) IVPB 3.375 g     3.375 g 12.5 mL/hr over 240 Minutes Intravenous Every 8 hours 11/17/17 1429     11/17/17 1300  piperacillin-tazobactam (ZOSYN) IVPB 3.375 g     3.375 g 100 mL/hr over 30 Minutes Intravenous  Once 11/17/17 1255 11/17/17 1352   11/17/17 1300  vancomycin (VANCOCIN) IVPB 1000 mg/200 mL premix     1,000 mg 200 mL/hr over 60 Minutes Intravenous  Once 11/17/17 1255 11/17/17 1501        Objective:   Vitals:   11/21/17 0006 11/21/17 0435 11/21/17 0500 11/21/17 0805  BP: 128/84 (!) 141/95    Pulse: 82 78    Resp: 18 14    Temp: 98.7 F (37.1 C) 98.5 F (36.9 C)  98.6 F (37 C)  TempSrc: Oral Oral  Oral  SpO2: 95% 98%    Weight:   75.9 kg (167 lb 5.3 oz)     Wt Readings from Last 3 Encounters:  11/21/17 75.9 kg (167 lb 5.3 oz)  11/13/17 75.9 kg (167 lb 5.3 oz)  01/21/17 78.9 kg (174 lb)     Intake/Output Summary (Last 24 hours) at 11/21/2017 1028 Last data filed at 11/21/2017 0726 Gross per 24 hour  Intake -  Output 1250 ml  Net -1250 ml     Physical Exam  Awake Alert, frail elderly female, laying in bed in no apparent distress Good air entry bilaterally, clear to auscultation, no wheezing  Regular rate and rhythm, no rubs murmurs gallops Abdomen soft, nontender, nondistended, bowel sounds present No edema or clubbing or cyanosis, and left arm area there is a small area of swelling most likely related to small hematoma     Data Review:    CBC Recent Labs  Lab 11/17/17 1155 11/17/17 1911 11/18/17 0731 11/19/17 0034 11/20/17 0534 11/21/17 3903  WBC 22.1* 21.8* 19.6* 17.4* 15.5* 10.6*  HGB 11.8* 11.9* 11.5* 10.1* 9.9* 10.0*  HCT 38.1 37.4 36.4 31.8* 31.2* 31.2*  PLT 277 249 240 208 263 288  MCV 81.4 79.9 80.0 79.3 79.6 79.0  MCH 25.2* 25.4* 25.3* 25.2* 25.3* 25.3*  MCHC 31.0 31.8 31.6 31.8 31.7 32.1  RDW 15.1 15.0 15.0 14.8 15.1 15.1  LYMPHSABS 1.7 1.8  --   --   --   --   MONOABS  2.5* 2.4*  --   --   --   --   EOSABS 0.0 0.0  --   --   --   --   BASOSABS 0.1 0.1  --   --   --   --     Chemistries  Recent Labs  Lab 11/17/17 1155 11/18/17 0731 11/19/17 0034 11/21/17 0437  NA 142 143 139 141  K 3.7 3.5 3.8 3.8  CL 109 108 107 109  CO2 _0 GLUCOSE 221* 160* 168* 131*  BUN _1 36*  CREATININE 0.92 0.74 1.61* 2.99*  CALCIUM 9.1 8.9 8.3* 8.4*  AST 42*  --   --   --   ALT 58*  --   --   --   ALKPHOS 70  --   --   --   BILITOT 0.7  --   --   --    ------------------------------------------------------------------------------------------------------------------ No results for input(s): CHOL, HDL, LDLCALC, TRIG, CHOLHDL, LDLDIRECT in the last 72 hours.  Lab Results  Component Value Date   HGBA1C 6.1 (H) 12/29/2011   ------------------------------------------------------------------------------------------------------------------ No results for input(s): TSH, T4TOTAL, T3FREE, THYROIDAB in the last 72 hours.  Invalid input(s): FREET3 ------------------------------------------------------------------------------------------------------------------ No results for input(s): VITAMINB12, FOLATE, FERRITIN, TIBC, IRON, RETICCTPCT in the last 72 hours.  Coagulation profile Recent Labs  Lab 11/17/17 1602 11/17/17 1911  INR 1.17 1.24    No results for input(s): DDIMER in the last 72 hours.  Cardiac Enzymes Recent Labs  Lab 11/19/17 0114 11/19/17 0731 11/19/17 1330  TROPONINI 0.04* <0.03 0.03*   ------------------------------------------------------------------------------------------------------------------ No results found for: BNP  Inpatient Medications  Scheduled Meds: . aspirin  81 mg Oral Daily  . atorvastatin  20 mg Oral q1800  . cholecalciferol  800 Units Oral Daily  . cloNIDine  0.1 mg Oral BID  . ferrous sulfate  325 mg Oral Q breakfast  . gabapentin  300 mg Oral Q12H  . hydroxypropyl methylcellulose / hypromellose  1 drop  Both Eyes BID  . insulin aspart  0-9 Units Subcutaneous TID WC  . latanoprost  1 drop Both Eyes QHS  . mouth rinse  15 mL Mouth Rinse BID  . metoprolol succinate  75 mg Oral Daily  . polyethylene glycol  17 g Oral Daily   Continuous Infusions: . sodium chloride 75 mL/hr at 11/21/17 0857  . heparin 1,000 Units/hr (11/21/17 0855)  . piperacillin-tazobactam (ZOSYN)  IV 3.375 g (11/21/17 0631)   PRN Meds:.acetaminophen **OR** acetaminophen, bisacodyl, diphenhydrAMINE, hydrALAZINE, HYDROcodone-acetaminophen, nitroGLYCERIN, ondansetron **OR** ondansetron (ZOFRAN) IV, senna-docusate, zinc oxide  Micro Results Recent Results (from the past 240 hour(s))  MRSA PCR Screening     Status: None   Collection Time: 11/13/17  2:53 PM  Result Value Ref Range Status   MRSA by PCR NEGATIVE NEGATIVE Final    Comment:        The GeneXpert MRSA Assay (FDA approved for NASAL specimens only), is one component of a comprehensive MRSA colonization surveillance program. It is  not intended to diagnose MRSA infection nor to guide or monitor treatment for MRSA infections. Performed at [facility], [place] [address], [place], [place] [address]   Blood Culture (routine x 2)     Status: None (Preliminary result)   Collection Time: 11/17/17 12:33 PM  Result Value Ref Range Status   Specimen Description BLOOD LEFT ANTECUBITAL  Final   Special Requests   Final    BOTTLES DRAWN AEROBIC AND ANAEROBIC Blood Culture results may not be optimal due to an inadequate volume of blood received in culture bottles   Culture   Final    NO GROWTH 3 DAYS Performed at [facility], [place] [address], [place], [place] [address]    Report Status PENDING  Incomplete  Blood Culture (routine x 2)     Status: None (Preliminary result)   Collection Time: 11/17/17  1:16 PM  Result Value Ref Range Status   Specimen Description BLOOD RIGHT HAND  Final   Special Requests   Final    BOTTLES DRAWN AEROBIC AND ANAEROBIC Blood  Culture adequate volume   Culture   Final    NO GROWTH 3 DAYS Performed at [facility], [place] [address], [place], [place] [address]    Report Status PENDING  Incomplete  Culture, blood (x 2)     Status: None (Preliminary result)   Collection Time: 11/17/17  4:16 PM  Result Value Ref Range Status   Specimen Description BLOOD LEFT ANTECUBITAL  Final   Special Requests   Final    BOTTLES DRAWN AEROBIC AND ANAEROBIC Blood Culture results may not be optimal due to an inadequate volume of blood received in culture bottles   Culture   Final    NO GROWTH 3 DAYS Performed at [facility], [place] [address], [place], [place] [address]    Report Status PENDING  Incomplete  Culture, blood (x 2)     Status: None (Preliminary result)   Collection Time: 11/17/17  4:21 PM  Result Value Ref Range Status   Specimen Description BLOOD RIGHT HAND  Final   Special Requests   Final    BOTTLES DRAWN AEROBIC AND ANAEROBIC Blood Culture adequate volume   Culture   Final    NO GROWTH 3 DAYS Performed at [facility], [place] [address] [place],  [address]    Report Status PENDING  Incomplete    Radiology Reports Ct Abdomen Pelvis Wo Contrast  Result Date: 11/17/2017 CLINICAL DATA:  62 year old female with acute abdominal pain and diarrhea. EXAM: CT ABDOMEN AND PELVIS WITHOUT CONTRAST TECHNIQUE: Multidetector CT imaging of the abdomen and pelvis was performed following the standard protocol without IV contrast. COMPARISON:  11/12/2017 CT FINDINGS: Please note that parenchymal abnormalities may be missed without intravenous contrast. Lower chest: Bibasilar atelectasis again noted. Elevated LEFT hemidiaphragm again noted. Hepatobiliary: The liver and gallbladder are unremarkable. No definite biliary dilatation. Pancreas: Unremarkable Spleen: Unremarkable Adrenals/Urinary Tract: The kidneys, adrenal glands and bladder are unremarkable. Stomach/Bowel: Stomach is within normal limits. No evidence  of bowel wall thickening, distention, or inflammatory changes. Vascular/Lymphatic: Aortic atherosclerosis. No enlarged abdominal or pelvic lymph nodes. Reproductive: Status post hysterectomy. No adnexal masses. Other: No ascites, focal collection or pneumoperitoneum. Musculoskeletal: No acute or suspicious bony abnormality. Apex LEFT lumbar scoliosis and moderate degenerative disc disease at L3-4 again identified. IMPRESSION: 1. No evidence of acute abnormality within the abdomen or pelvis. 2. Bibasilar atelectasis again noted 3.  Aortic Atherosclerosis (ICD10-I70.0).  Electronically Signed   By: Margarette Canada M.D.   On: 11/17/2017 15:39   Dg Chest 2 View  Result Date: 11/12/2017 CLINICAL DATA:  Arm pain. EXAM: CHEST - 2 VIEW COMPARISON:  Chest x-ray dated December 28, 2011. FINDINGS: The heart size and mediastinal contours are within normal limits. Normal pulmonary vascularity. No focal consolidation, pleural effusion, or pneumothorax. Unchanged elevation of the left hemidiaphragm. No acute osseous abnormality. IMPRESSION: No active cardiopulmonary disease. Electronically Signed   By: Titus Dubin M.D.   On: 11/12/2017 19:56   Ct Angio Chest Pe W Or Wo Contrast  Result Date: 11/17/2017 CLINICAL DATA:  Tachycardia.  Recent PE/DVT. EXAM: CT ANGIOGRAPHY CHEST WITH CONTRAST TECHNIQUE: Multidetector CT imaging of the chest was performed using the standard protocol during bolus administration of intravenous contrast. Multiplanar CT image reconstructions and MIPs were obtained to evaluate the vascular anatomy. CONTRAST:  1105m ISOVUE-370 IOPAMIDOL (ISOVUE-370) INJECTION 76% COMPARISON:  11/12/2016 FINDINGS: Cardiovascular: Heart is normal in size. Calcified plaque over the left anterior descending and right coronary arteries. Mild calcified plaque over the thoracic aorta. Pulmonary arterial system is well opacified demonstrates no significant change in moderate emboli over the right lower lobar pulmonary arteries  with slight improvement in the proximal right upper lobar arteries and emboli over the right middle lobar pulmonary artery. New pulmonary emboli over the left upper lobe and proximal left lower lobar pulmonary arteries. RV/LV ratio is 33.1/35.4 is 0.93. Mediastinum/Nodes: No mediastinal or hilar adenopathy. Remaining mediastinal structures are within normal. Lungs/Pleura: Elevated left hemidiaphragm with slight worsening opacification left base likely atelectasis. Right lung is clear. Airways are normal. Upper Abdomen: No acute findings. Calcified plaque over the abdominal aorta. Musculoskeletal: Unchanged. Review of the MIP images confirms the above findings. IMPRESSION: Known right-sided pulmonary emboli without significant overall change and new pulmonary emboli over the proximal right upper and lower lobar pulmonary arteries. Positive for acute PE with CT evidence of right heart strain (RV/LV Ratio = 0.93) consistent with at least submassive (intermediate risk) PE. The presence of right heart strain has been associated with an increased risk of morbidity and mortality. Please activate Code PE by paging [phone] Slight worsening opacification left base likely atelectasis. Atherosclerotic coronary artery disease. Aortic Atherosclerosis (ICD10-I70.0). These results will be called to the ordering clinician or representative by the Radiologist Assistant, and communication documented in the PACS or zVision Dashboard. Electronically Signed   By: DMarin OlpM.D.   On: 11/17/2017 22:15   Ct Angio Chest Pe W/cm &/or Wo Cm  Result Date: 11/12/2017 CLINICAL DATA:  Right cheek and arm cramping. Chest pain and tachycardia. EXAM: CT ANGIOGRAPHY CHEST CT ABDOMEN AND PELVIS WITH CONTRAST TECHNIQUE: Multidetector CT imaging of the chest was performed using the standard protocol during bolus administration of intravenous contrast. Multiplanar CT image reconstructions and MIPs were obtained to evaluate the vascular  anatomy. Multidetector CT imaging of the abdomen and pelvis was performed using the standard protocol during bolus administration of intravenous contrast. CONTRAST:  1034mISOVUE-300 IOPAMIDOL (ISOVUE-300) INJECTION 61% COMPARISON:  CXR 11/12/2017 FINDINGS: CTA CHEST FINDINGS Cardiovascular: Acute pulmonary emboli at the branch point of the right main pulmonary embolus extending into the right lobar level in the upper lobe and segmental level to the right lower lobe. RV/LV ratio elevated at 0.79. Left ventricular hypertrophy is seen. No pericardial effusion. No aortic aneurysm or dissection. Mild ectasia of the ascending aorta to 3.7 cm. Common branch point of the right brachiocephalic and left common carotid arteries without  stenosis of the great vessels. Coronary arteriosclerosis is noted along the LAD. Mediastinum/Nodes: No mediastinal nor hilar adenopathy. Trachea is unremarkable. Mainstem bronchi are patent. Lungs/Pleura: Dependent atelectasis. No acute pulmonary abnormality. No effusion or pneumothorax. No dominant mass. Elevated left hemidiaphragm. Musculoskeletal: No chest wall abnormality. No acute or significant osseous findings. Review of the MIP images confirms the above findings. CT ABDOMEN and PELVIS FINDINGS Hepatobiliary: No focal liver abnormality is seen. No gallstones, gallbladder wall thickening, or biliary dilatation. Pancreas: Unremarkable. No pancreatic ductal dilatation or surrounding inflammatory changes. Spleen: Normal size spleen without mass. Punctate calcification consistent with a tiny granuloma is noted within. Adrenals/Urinary Tract: Normal bilateral adrenal glands. Symmetric cortical enhancement of kidneys without enhancing mass lesions. No nephrolithiasis nor hydroureteronephrosis. The urinary bladder is unremarkable for the degree of distention. Stomach/Bowel: Small hiatal hernia. Decompressed stomach with normal small bowel rotation. No bowel obstruction or inflammation. The  terminal and distal ileum are normal. The appendix is not well visualized but no pericecal inflammatory change is seen. Moderate stool retention is noted within colon. Vascular/Lymphatic: Moderate aortoiliac atherosclerosis without aneurysm. No adenopathy. Reproductive: Hysterectomy.  No adnexal mass. Other: Tiny fat containing umbilical hernia. No free air nor free fluid. Musculoskeletal: Degenerative disc disease L3-4. No acute osseous abnormality. Review of the MIP images confirms the above findings. IMPRESSION: Chest CT: 1. Positive for acute PE to the right upper and lower lobes with CT evidence of right heart strain (RV/LV Ratio = 0.78) consistent with at least submassive (intermediate risk) PE. The presence of right heart strain has been associated with an increased risk of morbidity and mortality. Please activate Code PE by paging [phone]. These results were called by telephone at the time of interpretation on 11/12/2017 at 11:43 pm to PA [facility] , who verbally acknowledged these results. 2. No active pulmonary disease. 3. Coronary arteriosclerosis along the LAD. 4. Mild aortic atherosclerosis without aneurysm or dissection. CT AP: 1. No acute solid nor hollow visceral organ pathology. 2. Degenerative disc disease L3-4. Electronically Signed   By: Ashley Royalty M.D.   On: 11/12/2017 23:44   Ct Abdomen Pelvis W Contrast  Result Date: 11/12/2017 CLINICAL DATA:  Right cheek and arm cramping. Chest pain and tachycardia. EXAM: CT ANGIOGRAPHY CHEST CT ABDOMEN AND PELVIS WITH CONTRAST TECHNIQUE: Multidetector CT imaging of the chest was performed using the standard protocol during bolus administration of intravenous contrast. Multiplanar CT image reconstructions and MIPs were obtained to evaluate the vascular anatomy. Multidetector CT imaging of the abdomen and pelvis was performed using the standard protocol during bolus administration of intravenous contrast. CONTRAST:  173m ISOVUE-300 IOPAMIDOL  (ISOVUE-300) INJECTION 61% COMPARISON:  CXR 11/12/2017 FINDINGS: CTA CHEST FINDINGS Cardiovascular: Acute pulmonary emboli at the branch point of the right main pulmonary embolus extending into the right lobar level in the upper lobe and segmental level to the right lower lobe. RV/LV ratio elevated at 0.79. Left ventricular hypertrophy is seen. No pericardial effusion. No aortic aneurysm or dissection. Mild ectasia of the ascending aorta to 3.7 cm. Common branch point of the right brachiocephalic and left common carotid arteries without stenosis of the great vessels. Coronary arteriosclerosis is noted along the LAD. Mediastinum/Nodes: No mediastinal nor hilar adenopathy. Trachea is unremarkable. Mainstem bronchi are patent. Lungs/Pleura: Dependent atelectasis. No acute pulmonary abnormality. No effusion or pneumothorax. No dominant mass. Elevated left hemidiaphragm. Musculoskeletal: No chest wall abnormality. No acute or significant osseous findings. Review of the MIP images confirms the above findings. CT ABDOMEN and PELVIS FINDINGS Hepatobiliary:  No focal liver abnormality is seen. No gallstones, gallbladder wall thickening, or biliary dilatation. Pancreas: Unremarkable. No pancreatic ductal dilatation or surrounding inflammatory changes. Spleen: Normal size spleen without mass. Punctate calcification consistent with a tiny granuloma is noted within. Adrenals/Urinary Tract: Normal bilateral adrenal glands. Symmetric cortical enhancement of kidneys without enhancing mass lesions. No nephrolithiasis nor hydroureteronephrosis. The urinary bladder is unremarkable for the degree of distention. Stomach/Bowel: Small hiatal hernia. Decompressed stomach with normal small bowel rotation. No bowel obstruction or inflammation. The terminal and distal ileum are normal. The appendix is not well visualized but no pericecal inflammatory change is seen. Moderate stool retention is noted within colon. Vascular/Lymphatic: Moderate  aortoiliac atherosclerosis without aneurysm. No adenopathy. Reproductive: Hysterectomy.  No adnexal mass. Other: Tiny fat containing umbilical hernia. No free air nor free fluid. Musculoskeletal: Degenerative disc disease L3-4. No acute osseous abnormality. Review of the MIP images confirms the above findings. IMPRESSION: Chest CT: 1. Positive for acute PE to the right upper and lower lobes with CT evidence of right heart strain (RV/LV Ratio = 0.78) consistent with at least submassive (intermediate risk) PE. The presence of right heart strain has been associated with an increased risk of morbidity and mortality. Please activate Code PE by paging [phone]. These results were called by telephone at the time of interpretation on 11/12/2017 at 11:43 pm to PA [facility] , who verbally acknowledged these results. 2. No active pulmonary disease. 3. Coronary arteriosclerosis along the LAD. 4. Mild aortic atherosclerosis without aneurysm or dissection. CT AP: 1. No acute solid nor hollow visceral organ pathology. 2. Degenerative disc disease L3-4. Electronically Signed   By: Ashley Royalty M.D.   On: 11/12/2017 23:44   Dg Chest Port 1 View  Result Date: 11/17/2017 CLINICAL DATA:  Fever EXAM: PORTABLE CHEST 1 VIEW COMPARISON:  CT chest 11/12/2017.  Chest x-ray 11/12/2017. FINDINGS: 1255 hours. The cardiopericardial silhouette is within normal limits for size. The visualized bony structures of the thorax are intact. Telemetry leads overlie the chest. Fairly prominent gaseous distention of colon visualized in the upper abdomen. Marked asymmetric elevation left hemidiaphragm with adjacent atelectasis in the left lung base. IMPRESSION: Asymmetric elevation left hemidiaphragm with adjacent left base atelectasis. Diffuse gaseous distention of colon within the visualized upper abdomen. Electronically Signed   By: Misty Stanley M.D.   On: 11/17/2017 13:07     Phillips Climes M.D on 11/21/2017 at 10:28 AM  Between 7am to 7pm  - Pager - [phone]  After 7pm go to [email or web address] - password [facility]  [facility] -  Office  [phone]

## 2017-11-21 NOTE — Progress Notes (Signed)
ANTICOAGULATION CONSULT NOTE - Initial Consult  Pharmacy Consult for Xarelto>>>>>Heparin  Indication: pulmonary embolus and DVT  No Known Allergies  Patient Measurements: Weight: 167 lb 5.3 oz (75.9 kg)  Vital Signs: Temp: 98.5 F (36.9 C) (07/08 0435) Temp Source: Oral (07/08 0435) BP: 141/95 (07/08 0435) Pulse Rate: 78 (07/08 0435)  Labs: Recent Labs    11/18/17 0731 11/18/17 1647 11/19/17 0034 11/19/17 0114 11/19/17 0731 11/19/17 1330 11/20/17 0534 11/21/17 0437  HGB 11.5*  --  10.1*  --   --   --  9.9* 10.0*  HCT 36.4  --  31.8*  --   --   --  31.2* 31.2*  PLT 240  --  208  --   --   --  263 288  APTT 41* 39* 43*  --   --   --   --   --   HEPARINUNFRC 0.75* 0.51 0.55  --   --   --  >2.20*  --   CREATININE 0.74  --  1.61*  --   --   --   --  2.99*  TROPONINI  --   --   --  0.04* <0.03 0.03*  --   --     Estimated Creatinine Clearance: 20.7 mL/min (A) (by C-G formula based on SCr of 2.99 mg/dL (H)).   Medical History: Past Medical History:  Diagnosis Date  . Allergic rhinitis   . Blood dyscrasia   . Dementia   . Depression   . Diabetes mellitus   . Dysmetabolic syndrome X   . Family history of anesthesia complication    brother "  . Hypertension   . Hypokalemia   . Multiple sclerosis (HCC)   . Neuropathic pain   . Urinary incontinence   . Vitamin D deficiency    Assessment: 62 y/o F who was on heparin from 6/30>>7/2, transitioned to Apixaban and DC'd but didn't get any apixaban upon DC, she was started back on heparin 7/4, then transitioned to Xarelto on 7/7. She is now being transitioned back to heparin. Xarelto dose would be due now, so will start heparin immediately. Will need to use aPTT to dose for now given Xarelto influence on heparin levels. CBC stable.   Goal of Therapy:  Heparin level 0.3-0.7 units/ml aPTT 66-102 seconds Monitor platelets by anticoagulation protocol: Yes   Plan:  Start heparin drip at 1000 units/hr  1600 aPTT/HL Daily  CBC/HL Monitor for bleeding FU change back to anti-coagulation   Abran Duke 11/21/2017,7:14 AM

## 2017-11-21 NOTE — Progress Notes (Signed)
PROGRESS NOTE                                                                                                                                                                                                             Patient Demographics:    [name], is a 62 y.o. female, DOB - 30-Nov-1955, AGT:[id number]  Admit date - 11/17/2017   Admitting Physician Karmen Bongo, MD  Outpatient Primary MD for the patient is Reymundo Poll, MD  LOS - 4   No chief complaint on file.      Brief Narrative     62 y.o. female with medical history significant for multiple sclerosis bedbound, diabetes, hypertension, hyperlipidemia, and a recent hospitalization  for PE and DVT, she was discharged on 11/15/2017 on Eliquis to ALF, unclear at this point if she was taking her Eliquis or not, she presents secondary to tachycardia, she was needed noted to have SIRS, repeat CTA chest showing with increased clot burden .  No source of infection could be identified, as well she developed AKI most likely contrast induced nephropathy   Subjective:    [name] today denies any complaints .    Assessment  & Plan :    Active Problems:   MS (multiple sclerosis) (HCC)   Fever   DM2 (diabetes mellitus, type 2) (HCC)   Encephalopathy, metabolic   Depression   Pyelonephritis, acute   Pulmonary embolism (HCC)   DVT (deep venous thrombosis) Rt Leg   Leukocytosis   Sepsis (HCC)  SIRS -SIRS criteria met on admission, leukocytosis, fever, tachycardia and tachypnea, and elevated lactic acid. -Check work-up remains negative, no infectious etiology on urine analysis, as well as CT chest abdomen pelvis with no evidence of infectious etiology, -Blood cultures remain negative to date -Empirically on vancomycin and Zosyn on admission, stop vancomycin 11/19/2017, currently on Zosyn, will continue for the next 24 hours, will DC if work-up remains negative (kept on antibiotics given elevated  procalcitonin and it is trending up initially)  PE -Patient with recent diagnosis of PE, she was discharged on Eliquis, she was discharged to an assisted living facility, case manager confirmed, patient was taking her Eliquis at facility, appears her PE has progressed despite being on Eliquis, kept on heparin GTT for first 72 hours on admission, went to Xarelto yesterday, but given her worsening  renal function this morning with a creatinine of 3, I have transitioned her back to heparin GTT, which she is to resume back on Xarelto once renal function is more stable.  AKI -Creatinine 0.74 on admission, it has a climbed up to 2.99 today, I think this is most likely contrast induced nephropathy as she did receive CTA chest on admission, lisinopril on hold, continue to hold nephrotoxic medications, I will increase her IV fluids from 75 to 100 cc . -Follow on urine sodium   Sinus tachycardia -Most likely in the setting of fever and SIRS, and PE . - improving after giving some fluids, and increasing her Toprol-XL  Type 2 diabetes mellitus -Continue with insulin sliding scale  Hypertension -Meds labile, but overall much improved after increasing her Toprol-XL from 50 to 75 mg daily   Hyperlipidemia Continue home statins  Anemia of chronic disease,  -Continue to monitor H&H closely as she is on anticoagulation   History of MS,  - with neuromuscular deficits, the patient does interferon beta injections 30 mcg q. 7 days, supportive care, home health nursing and physical therapy Follow up with neurology as outpatient     Code Status : DNR  Family Communication  : Try to call both sisters via phone, but they were unavailable  Disposition Plan  : Alf when stable, once her renal function starts to improve  Consults  :  none  Procedures  : none  DVT Prophylaxis  :  On heparin GTT  Lab Results  Component Value Date   PLT 288 11/21/2017    Antibiotics  :    Anti-infectives (From  admission, onward)   Start     Dose/Rate Route Frequency Ordered Stop   11/20/17 0230  vancomycin (VANCOCIN) IVPB 1000 mg/200 mL premix  Status:  Discontinued     1,000 mg 200 mL/hr over 60 Minutes Intravenous Every 24 hours 11/19/17 1138 11/19/17 1609   11/18/17 0200  vancomycin (VANCOCIN) IVPB 1000 mg/200 mL premix  Status:  Discontinued     1,000 mg 200 mL/hr over 60 Minutes Intravenous Every 12 hours 11/17/17 1429 11/19/17 1138   11/17/17 2200  piperacillin-tazobactam (ZOSYN) IVPB 3.375 g     3.375 g 12.5 mL/hr over 240 Minutes Intravenous Every 8 hours 11/17/17 1429     11/17/17 1300  piperacillin-tazobactam (ZOSYN) IVPB 3.375 g     3.375 g 100 mL/hr over 30 Minutes Intravenous  Once 11/17/17 1255 11/17/17 1352   11/17/17 1300  vancomycin (VANCOCIN) IVPB 1000 mg/200 mL premix     1,000 mg 200 mL/hr over 60 Minutes Intravenous  Once 11/17/17 1255 11/17/17 1501        Objective:   Vitals:   11/21/17 0006 11/21/17 0435 11/21/17 0500 11/21/17 0805  BP: 128/84 (!) 141/95    Pulse: 82 78    Resp: 18 14    Temp: 98.7 F (37.1 C) 98.5 F (36.9 C)  98.6 F (37 C)  TempSrc: Oral Oral  Oral  SpO2: 95% 98%    Weight:   75.9 kg (167 lb 5.3 oz)     Wt Readings from Last 3 Encounters:  11/21/17 75.9 kg (167 lb 5.3 oz)  11/13/17 75.9 kg (167 lb 5.3 oz)  01/21/17 78.9 kg (174 lb)     Intake/Output Summary (Last 24 hours) at 11/21/2017 1121 Last data filed at 11/21/2017 1050 Gross per 24 hour  Intake 120 ml  Output 1250 ml  Net -1130 ml     Physical Exam  Awake Alert, frail, elderly female laying in bed in no apparent distress Symmetrical Chest wall movement, Good air movement bilaterally, CTAB RRR,No Gallops,Rubs or new Murmurs, No Parasternal Heave +ve B.Sounds, Abd Soft, No tenderness, No rebound - guarding or rigidity. No Cyanosis, Clubbing or edema, No new Rash or bruise     Data Review:    CBC Recent Labs  Lab 11/17/17 1155 11/17/17 1911 11/18/17 0731  11/19/17 0034 11/20/17 0534 11/21/17 0437  WBC 22.1* 21.8* 19.6* 17.4* 15.5* 10.6*  HGB 11.8* 11.9* 11.5* 10.1* 9.9* 10.0*  HCT 38.1 37.4 36.4 31.8* 31.2* 31.2*  PLT 277 249 240 208 263 288  MCV 81.4 79.9 80.0 79.3 79.6 79.0  MCH 25.2* 25.4* 25.3* 25.2* 25.3* 25.3*  MCHC 31.0 31.8 31.6 31.8 31.7 32.1  RDW 15.1 15.0 15.0 14.8 15.1 15.1  LYMPHSABS 1.7 1.8  --   --   --   --   MONOABS 2.5* 2.4*  --   --   --   --   EOSABS 0.0 0.0  --   --   --   --   BASOSABS 0.1 0.1  --   --   --   --     Chemistries  Recent Labs  Lab 11/17/17 1155 11/18/17 0731 11/19/17 0034 11/21/17 0437  NA 142 143 139 141  K 3.7 3.5 3.8 3.8  CL 109 108 107 109  CO2 '22 24 23 24  ' GLUCOSE 221* 160* 168* 131*  BUN '12 9 19 ' 36*  CREATININE 0.92 0.74 1.61* 2.99*  CALCIUM 9.1 8.9 8.3* 8.4*  AST 42*  --   --   --   ALT 58*  --   --   --   ALKPHOS 70  --   --   --   BILITOT 0.7  --   --   --    ------------------------------------------------------------------------------------------------------------------ No results for input(s): CHOL, HDL, LDLCALC, TRIG, CHOLHDL, LDLDIRECT in the last 72 hours.  Lab Results  Component Value Date   HGBA1C 6.1 (H) 12/29/2011   ------------------------------------------------------------------------------------------------------------------ No results for input(s): TSH, T4TOTAL, T3FREE, THYROIDAB in the last 72 hours.  Invalid input(s): FREET3 ------------------------------------------------------------------------------------------------------------------ No results for input(s): VITAMINB12, FOLATE, FERRITIN, TIBC, IRON, RETICCTPCT in the last 72 hours.  Coagulation profile Recent Labs  Lab 11/17/17 1602 11/17/17 1911  INR 1.17 1.24    No results for input(s): DDIMER in the last 72 hours.  Cardiac Enzymes Recent Labs  Lab 11/19/17 0114 11/19/17 0731 11/19/17 1330  TROPONINI 0.04* <0.03 0.03*    ------------------------------------------------------------------------------------------------------------------ No results found for: BNP  Inpatient Medications  Scheduled Meds: . aspirin  81 mg Oral Daily  . atorvastatin  20 mg Oral q1800  . cholecalciferol  800 Units Oral Daily  . cloNIDine  0.1 mg Oral BID  . ferrous sulfate  325 mg Oral Q breakfast  . gabapentin  300 mg Oral Q12H  . hydroxypropyl methylcellulose / hypromellose  1 drop Both Eyes BID  . insulin aspart  0-9 Units Subcutaneous TID WC  . latanoprost  1 drop Both Eyes QHS  . mouth rinse  15 mL Mouth Rinse BID  . metoprolol succinate  75 mg Oral Daily  . polyethylene glycol  17 g Oral Daily   Continuous Infusions: . sodium chloride 75 mL/hr at 11/21/17 0857  . heparin 1,000 Units/hr (11/21/17 0855)  . piperacillin-tazobactam (ZOSYN)  IV 3.375 g (11/21/17 0631)   PRN Meds:.acetaminophen **OR** acetaminophen, bisacodyl, diphenhydrAMINE, hydrALAZINE, HYDROcodone-acetaminophen, nitroGLYCERIN, ondansetron **OR** ondansetron (ZOFRAN) IV,  senna-docusate, zinc oxide  Micro Results Recent Results (from the past 240 hour(s))  MRSA PCR Screening     Status: None   Collection Time: 11/13/17  2:53 PM  Result Value Ref Range Status   MRSA by PCR NEGATIVE NEGATIVE Final    Comment:        The GeneXpert MRSA Assay (FDA approved for NASAL specimens only), is one component of a comprehensive MRSA colonization surveillance program. It is not intended to diagnose MRSA infection nor to guide or monitor treatment for MRSA infections. Performed at [facility], [place] [address] [place], [place] [address]   Blood Culture (routine x 2)     Status: None (Preliminary result)   Collection Time: 11/17/17 12:33 PM  Result Value Ref Range Status   Specimen Description BLOOD LEFT ANTECUBITAL  Final   Special Requests   Final    BOTTLES DRAWN AEROBIC AND ANAEROBIC Blood Culture results may not be optimal due to an inadequate  volume of blood received in culture bottles   Culture   Final    NO GROWTH 3 DAYS Performed at [facility], [place] [address] [place], [place] [address]    Report Status PENDING  Incomplete  Blood Culture (routine x 2)     Status: None (Preliminary result)   Collection Time: 11/17/17  1:16 PM  Result Value Ref Range Status   Specimen Description BLOOD RIGHT HAND  Final   Special Requests   Final    BOTTLES DRAWN AEROBIC AND ANAEROBIC Blood Culture adequate volume   Culture   Final    NO GROWTH 3 DAYS Performed at [facility], [place] [address], [place], [place] [address]    Report Status PENDING  Incomplete  Culture, blood (x 2)     Status: None (Preliminary result)   Collection Time: 11/17/17  4:16 PM  Result Value Ref Range Status   Specimen Description BLOOD LEFT ANTECUBITAL  Final   Special Requests   Final    BOTTLES DRAWN AEROBIC AND ANAEROBIC Blood Culture results may not be optimal due to an inadequate volume of blood received in culture bottles   Culture   Final    NO GROWTH 3 DAYS Performed at [facility], [place] [address], [place], [place] [address]    Report Status PENDING  Incomplete  Culture, blood (x 2)     Status: None (Preliminary result)   Collection Time: 11/17/17  4:21 PM  Result Value Ref Range Status   Specimen Description BLOOD RIGHT HAND  Final   Special Requests   Final    BOTTLES DRAWN AEROBIC AND ANAEROBIC Blood Culture adequate volume   Culture   Final    NO GROWTH 3 DAYS Performed at [facility], [place] [address] [place],  [address]    Report Status PENDING  Incomplete    Radiology Reports Ct Abdomen Pelvis Wo Contrast  Result Date: 11/17/2017 CLINICAL DATA:  62 year old female with acute abdominal pain and diarrhea. EXAM: CT ABDOMEN AND PELVIS WITHOUT CONTRAST TECHNIQUE: Multidetector CT imaging of the abdomen and pelvis was performed following the standard protocol without IV contrast. COMPARISON:  11/12/2017 CT  FINDINGS: Please note that parenchymal abnormalities may be missed without intravenous contrast. Lower chest: Bibasilar atelectasis again noted. Elevated LEFT hemidiaphragm again noted. Hepatobiliary: The liver and gallbladder are unremarkable. No definite biliary dilatation. Pancreas: Unremarkable Spleen: Unremarkable Adrenals/Urinary Tract: The kidneys, adrenal glands and bladder are unremarkable. Stomach/Bowel: Stomach is within  normal limits. No evidence of bowel wall thickening, distention, or inflammatory changes. Vascular/Lymphatic: Aortic atherosclerosis. No enlarged abdominal or pelvic lymph nodes. Reproductive: Status post hysterectomy. No adnexal masses. Other: No ascites, focal collection or pneumoperitoneum. Musculoskeletal: No acute or suspicious bony abnormality. Apex LEFT lumbar scoliosis and moderate degenerative disc disease at L3-4 again identified. IMPRESSION: 1. No evidence of acute abnormality within the abdomen or pelvis. 2. Bibasilar atelectasis again noted 3.  Aortic Atherosclerosis (ICD10-I70.0). Electronically Signed   By: Margarette Canada M.D.   On: 11/17/2017 15:39   Dg Chest 2 View  Result Date: 11/12/2017 CLINICAL DATA:  Arm pain. EXAM: CHEST - 2 VIEW COMPARISON:  Chest x-ray dated December 28, 2011. FINDINGS: The heart size and mediastinal contours are within normal limits. Normal pulmonary vascularity. No focal consolidation, pleural effusion, or pneumothorax. Unchanged elevation of the left hemidiaphragm. No acute osseous abnormality. IMPRESSION: No active cardiopulmonary disease. Electronically Signed   By: Titus Dubin M.D.   On: 11/12/2017 19:56   Ct Angio Chest Pe W Or Wo Contrast  Result Date: 11/17/2017 CLINICAL DATA:  Tachycardia.  Recent PE/DVT. EXAM: CT ANGIOGRAPHY CHEST WITH CONTRAST TECHNIQUE: Multidetector CT imaging of the chest was performed using the standard protocol during bolus administration of intravenous contrast. Multiplanar CT image reconstructions and MIPs  were obtained to evaluate the vascular anatomy. CONTRAST:  124m ISOVUE-370 IOPAMIDOL (ISOVUE-370) INJECTION 76% COMPARISON:  11/12/2016 FINDINGS: Cardiovascular: Heart is normal in size. Calcified plaque over the left anterior descending and right coronary arteries. Mild calcified plaque over the thoracic aorta. Pulmonary arterial system is well opacified demonstrates no significant change in moderate emboli over the right lower lobar pulmonary arteries with slight improvement in the proximal right upper lobar arteries and emboli over the right middle lobar pulmonary artery. New pulmonary emboli over the left upper lobe and proximal left lower lobar pulmonary arteries. RV/LV ratio is 33.1/35.4 is 0.93. Mediastinum/Nodes: No mediastinal or hilar adenopathy. Remaining mediastinal structures are within normal. Lungs/Pleura: Elevated left hemidiaphragm with slight worsening opacification left base likely atelectasis. Right lung is clear. Airways are normal. Upper Abdomen: No acute findings. Calcified plaque over the abdominal aorta. Musculoskeletal: Unchanged. Review of the MIP images confirms the above findings. IMPRESSION: Known right-sided pulmonary emboli without significant overall change and new pulmonary emboli over the proximal right upper and lower lobar pulmonary arteries. Positive for acute PE with CT evidence of right heart strain (RV/LV Ratio = 0.93) consistent with at least submassive (intermediate risk) PE. The presence of right heart strain has been associated with an increased risk of morbidity and mortality. Please activate Code PE by paging [phone] Slight worsening opacification left base likely atelectasis. Atherosclerotic coronary artery disease. Aortic Atherosclerosis (ICD10-I70.0). These results will be called to the ordering clinician or representative by the Radiologist Assistant, and communication documented in the PACS or zVision Dashboard. Electronically Signed   By: DMarin OlpM.D.    On: 11/17/2017 22:15   Ct Angio Chest Pe W/cm &/or Wo Cm  Result Date: 11/12/2017 CLINICAL DATA:  Right cheek and arm cramping. Chest pain and tachycardia. EXAM: CT ANGIOGRAPHY CHEST CT ABDOMEN AND PELVIS WITH CONTRAST TECHNIQUE: Multidetector CT imaging of the chest was performed using the standard protocol during bolus administration of intravenous contrast. Multiplanar CT image reconstructions and MIPs were obtained to evaluate the vascular anatomy. Multidetector CT imaging of the abdomen and pelvis was performed using the standard protocol during bolus administration of intravenous contrast. CONTRAST:  1027mISOVUE-300 IOPAMIDOL (ISOVUE-300) INJECTION 61% COMPARISON:  CXR 11/12/2017 FINDINGS: CTA CHEST FINDINGS Cardiovascular: Acute pulmonary emboli at the branch point of the right main pulmonary embolus extending into the right lobar level in the upper lobe and segmental level to the right lower lobe. RV/LV ratio elevated at 0.79. Left ventricular hypertrophy is seen. No pericardial effusion. No aortic aneurysm or dissection. Mild ectasia of the ascending aorta to 3.7 cm. Common branch point of the right brachiocephalic and left common carotid arteries without stenosis of the great vessels. Coronary arteriosclerosis is noted along the LAD. Mediastinum/Nodes: No mediastinal nor hilar adenopathy. Trachea is unremarkable. Mainstem bronchi are patent. Lungs/Pleura: Dependent atelectasis. No acute pulmonary abnormality. No effusion or pneumothorax. No dominant mass. Elevated left hemidiaphragm. Musculoskeletal: No chest wall abnormality. No acute or significant osseous findings. Review of the MIP images confirms the above findings. CT ABDOMEN and PELVIS FINDINGS Hepatobiliary: No focal liver abnormality is seen. No gallstones, gallbladder wall thickening, or biliary dilatation. Pancreas: Unremarkable. No pancreatic ductal dilatation or surrounding inflammatory changes. Spleen: Normal size spleen without mass.  Punctate calcification consistent with a tiny granuloma is noted within. Adrenals/Urinary Tract: Normal bilateral adrenal glands. Symmetric cortical enhancement of kidneys without enhancing mass lesions. No nephrolithiasis nor hydroureteronephrosis. The urinary bladder is unremarkable for the degree of distention. Stomach/Bowel: Small hiatal hernia. Decompressed stomach with normal small bowel rotation. No bowel obstruction or inflammation. The terminal and distal ileum are normal. The appendix is not well visualized but no pericecal inflammatory change is seen. Moderate stool retention is noted within colon. Vascular/Lymphatic: Moderate aortoiliac atherosclerosis without aneurysm. No adenopathy. Reproductive: Hysterectomy.  No adnexal mass. Other: Tiny fat containing umbilical hernia. No free air nor free fluid. Musculoskeletal: Degenerative disc disease L3-4. No acute osseous abnormality. Review of the MIP images confirms the above findings. IMPRESSION: Chest CT: 1. Positive for acute PE to the right upper and lower lobes with CT evidence of right heart strain (RV/LV Ratio = 0.78) consistent with at least submassive (intermediate risk) PE. The presence of right heart strain has been associated with an increased risk of morbidity and mortality. Please activate Code PE by paging [phone]. These results were called by telephone at the time of interpretation on 11/12/2017 at 11:43 pm to PA [facility] , who verbally acknowledged these results. 2. No active pulmonary disease. 3. Coronary arteriosclerosis along the LAD. 4. Mild aortic atherosclerosis without aneurysm or dissection. CT AP: 1. No acute solid nor hollow visceral organ pathology. 2. Degenerative disc disease L3-4. Electronically Signed   By: Ashley Royalty M.D.   On: 11/12/2017 23:44   Ct Abdomen Pelvis W Contrast  Result Date: 11/12/2017 CLINICAL DATA:  Right cheek and arm cramping. Chest pain and tachycardia. EXAM: CT ANGIOGRAPHY CHEST CT ABDOMEN AND  PELVIS WITH CONTRAST TECHNIQUE: Multidetector CT imaging of the chest was performed using the standard protocol during bolus administration of intravenous contrast. Multiplanar CT image reconstructions and MIPs were obtained to evaluate the vascular anatomy. Multidetector CT imaging of the abdomen and pelvis was performed using the standard protocol during bolus administration of intravenous contrast. CONTRAST:  11m ISOVUE-300 IOPAMIDOL (ISOVUE-300) INJECTION 61% COMPARISON:  CXR 11/12/2017 FINDINGS: CTA CHEST FINDINGS Cardiovascular: Acute pulmonary emboli at the branch point of the right main pulmonary embolus extending into the right lobar level in the upper lobe and segmental level to the right lower lobe. RV/LV ratio elevated at 0.79. Left ventricular hypertrophy is seen. No pericardial effusion. No aortic aneurysm or dissection. Mild ectasia of the ascending aorta to 3.7 cm. Common branch point  of the right brachiocephalic and left common carotid arteries without stenosis of the great vessels. Coronary arteriosclerosis is noted along the LAD. Mediastinum/Nodes: No mediastinal nor hilar adenopathy. Trachea is unremarkable. Mainstem bronchi are patent. Lungs/Pleura: Dependent atelectasis. No acute pulmonary abnormality. No effusion or pneumothorax. No dominant mass. Elevated left hemidiaphragm. Musculoskeletal: No chest wall abnormality. No acute or significant osseous findings. Review of the MIP images confirms the above findings. CT ABDOMEN and PELVIS FINDINGS Hepatobiliary: No focal liver abnormality is seen. No gallstones, gallbladder wall thickening, or biliary dilatation. Pancreas: Unremarkable. No pancreatic ductal dilatation or surrounding inflammatory changes. Spleen: Normal size spleen without mass. Punctate calcification consistent with a tiny granuloma is noted within. Adrenals/Urinary Tract: Normal bilateral adrenal glands. Symmetric cortical enhancement of kidneys without enhancing mass lesions.  No nephrolithiasis nor hydroureteronephrosis. The urinary bladder is unremarkable for the degree of distention. Stomach/Bowel: Small hiatal hernia. Decompressed stomach with normal small bowel rotation. No bowel obstruction or inflammation. The terminal and distal ileum are normal. The appendix is not well visualized but no pericecal inflammatory change is seen. Moderate stool retention is noted within colon. Vascular/Lymphatic: Moderate aortoiliac atherosclerosis without aneurysm. No adenopathy. Reproductive: Hysterectomy.  No adnexal mass. Other: Tiny fat containing umbilical hernia. No free air nor free fluid. Musculoskeletal: Degenerative disc disease L3-4. No acute osseous abnormality. Review of the MIP images confirms the above findings. IMPRESSION: Chest CT: 1. Positive for acute PE to the right upper and lower lobes with CT evidence of right heart strain (RV/LV Ratio = 0.78) consistent with at least submassive (intermediate risk) PE. The presence of right heart strain has been associated with an increased risk of morbidity and mortality. Please activate Code PE by paging [phone]. These results were called by telephone at the time of interpretation on 11/12/2017 at 11:43 pm to PA [facility] , who verbally acknowledged these results. 2. No active pulmonary disease. 3. Coronary arteriosclerosis along the LAD. 4. Mild aortic atherosclerosis without aneurysm or dissection. CT AP: 1. No acute solid nor hollow visceral organ pathology. 2. Degenerative disc disease L3-4. Electronically Signed   By: Ashley Royalty M.D.   On: 11/12/2017 23:44   Dg Chest Port 1 View  Result Date: 11/17/2017 CLINICAL DATA:  Fever EXAM: PORTABLE CHEST 1 VIEW COMPARISON:  CT chest 11/12/2017.  Chest x-ray 11/12/2017. FINDINGS: 1255 hours. The cardiopericardial silhouette is within normal limits for size. The visualized bony structures of the thorax are intact. Telemetry leads overlie the chest. Fairly prominent gaseous distention of  colon visualized in the upper abdomen. Marked asymmetric elevation left hemidiaphragm with adjacent atelectasis in the left lung base. IMPRESSION: Asymmetric elevation left hemidiaphragm with adjacent left base atelectasis. Diffuse gaseous distention of colon within the visualized upper abdomen. Electronically Signed   By: Misty Stanley M.D.   On: 11/17/2017 13:07     Phillips Climes M.D on 11/21/2017 at 11:21 AM  Between 7am to 7pm - Pager - [phone]  After 7pm go to [email or web address] - password [facility]  [facility] -  Office  [phone]

## 2017-11-21 NOTE — Progress Notes (Signed)
ANTICOAGULATION CONSULT NOTE - Initial Consult  Pharmacy Consult for Xarelto>>>>>Heparin  Indication: pulmonary embolus and DVT  No Known Allergies  Patient Measurements: Weight: 167 lb 5.3 oz (75.9 kg)  Vital Signs: Temp: 98.6 F (37 C) (07/08 0805) Temp Source: Oral (07/08 0805)  Labs: Recent Labs    11/19/17 0034 11/19/17 0114 11/19/17 0731 11/19/17 1330 11/20/17 0534 11/21/17 0437 11/21/17 1605  HGB 10.1*  --   --   --  9.9* 10.0*  --   HCT 31.8*  --   --   --  31.2* 31.2*  --   PLT 208  --   --   --  263 288  --   APTT 43*  --   --   --   --   --  74*  HEPARINUNFRC 0.55  --   --   --  >2.20*  --  0.95*  CREATININE 1.61*  --   --   --   --  2.99*  --   TROPONINI  --  0.04* <0.03 0.03*  --   --   --     Estimated Creatinine Clearance: 20.7 mL/min (A) (by C-G formula based on SCr of 2.99 mg/dL (H)).   Medical History: Past Medical History:  Diagnosis Date  . Allergic rhinitis   . Blood dyscrasia   . Dementia   . Depression   . Diabetes mellitus   . Dysmetabolic syndrome X   . Family history of anesthesia complication    brother "  . Hypertension   . Hypokalemia   . Multiple sclerosis (HCC)   . Neuropathic pain   . Urinary incontinence   . Vitamin D deficiency    Assessment: 62 y/o F who was on heparin from 6/30>>7/2, transitioned to Apixaban and DC'd but didn't get any apixaban upon DC, she was started back on heparin 7/4, then transitioned to Xarelto on 7/7. She is now being transitioned back to heparin. Xarelto dose would be due now, so will start heparin immediately. Will need to use aPTT to dose for now given Xarelto influence on heparin levels. CBC stable.   Heparin level 0.95 and aPTT 74   Goal of Therapy:  Heparin level 0.3-0.7 units/ml aPTT 66-102 seconds Monitor platelets by anticoagulation protocol: Yes   Plan:  Continue heparin drip at 1000 units/hr  Confirmatory APTT in 6 hours (as Heparin level and APTT not correlating) Daily  CBC/HL Monitor for bleeding FU change back to anti-coagulation   Delissa Silba A. Jeanella Craze, PharmD, BCPS Clinical Pharmacist [place] Pager: [phone] Please utilize Amion for appropriate phone number to reach the unit pharmacist [facility])     11/21/2017,6:03 PM

## 2017-11-22 LAB — APTT
APTT: 53 s — AB (ref 24–36)
aPTT: 106 seconds — ABNORMAL HIGH (ref 24–36)

## 2017-11-22 LAB — CULTURE, BLOOD (ROUTINE X 2)
CULTURE: NO GROWTH
CULTURE: NO GROWTH
Culture: NO GROWTH
Culture: NO GROWTH
Special Requests: ADEQUATE
Special Requests: ADEQUATE

## 2017-11-22 LAB — GLUCOSE, CAPILLARY
Glucose-Capillary: 138 mg/dL — ABNORMAL HIGH (ref 70–99)
Glucose-Capillary: 166 mg/dL — ABNORMAL HIGH (ref 70–99)
Glucose-Capillary: 143 mg/dL — ABNORMAL HIGH (ref 70–99)
Glucose-Capillary: 166 mg/dL — ABNORMAL HIGH (ref 70–99)

## 2017-11-22 LAB — BASIC METABOLIC PANEL
ANION GAP: 15 (ref 5–15)
BUN: 33 mg/dL — ABNORMAL HIGH (ref 8–23)
CALCIUM: 7.8 mg/dL — AB (ref 8.9–10.3)
CO2: 15 mmol/L — ABNORMAL LOW (ref 22–32)
Chloride: 109 mmol/L (ref 98–111)
Creatinine, Ser: 2.62 mg/dL — ABNORMAL HIGH (ref 0.44–1.00)
GFR, EST AFRICAN AMERICAN: 21 mL/min — AB (ref >60)
GFR, EST NON AFRICAN AMERICAN: 18 mL/min — AB (ref >60)
Glucose, Bld: 149 mg/dL — ABNORMAL HIGH (ref 70–99)
Potassium: 3.4 mmol/L — ABNORMAL LOW (ref 3.5–5.1)
Sodium: 139 mmol/L (ref 135–145)

## 2017-11-22 LAB — HEPARIN LEVEL (UNFRACTIONATED)
HEPARIN UNFRACTIONATED: 0.8 [IU]/mL — AB (ref 0.30–0.70)
Heparin Unfractionated: 0.45 IU/mL (ref 0.30–0.70)

## 2017-11-22 LAB — CBC
HEMATOCRIT: 28.8 % — AB (ref 36.0–46.0)
Hemoglobin: 9 g/dL — ABNORMAL LOW (ref 12.0–15.0)
MCH: 24.9 pg — ABNORMAL LOW (ref 26.0–34.0)
MCHC: 31.3 g/dL (ref 30.0–36.0)
MCV: 79.8 fL (ref 78.0–100.0)
PLATELETS: 273 10*3/uL (ref 150–400)
RBC: 3.61 MIL/uL — AB (ref 3.87–5.11)
RDW: 15.2 % (ref 11.5–15.5)
WBC: 10.9 10*3/uL — AB (ref 4.0–10.5)

## 2017-11-22 MED ORDER — POTASSIUM CHLORIDE CRYS ER 20 MEQ PO TBCR
40.0000 meq | EXTENDED_RELEASE_TABLET | Freq: Once | ORAL | Status: AC
  Administered 2017-11-22: 40 meq via ORAL
  Filled 2017-11-22: qty 2

## 2017-11-22 MED ORDER — LABETALOL HCL 5 MG/ML IV SOLN
10.0000 mg | INTRAVENOUS | Status: AC | PRN
  Administered 2017-11-22 (×2): 10 mg via INTRAVENOUS
  Filled 2017-11-22 (×2): qty 4

## 2017-11-22 MED ORDER — CLONIDINE HCL 0.2 MG PO TABS
0.2000 mg | ORAL_TABLET | Freq: Once | ORAL | Status: AC
  Administered 2017-11-22: 0.2 mg via ORAL
  Filled 2017-11-22: qty 1

## 2017-11-22 MED ORDER — METOPROLOL TARTRATE 5 MG/5ML IV SOLN
5.0000 mg | Freq: Once | INTRAVENOUS | Status: AC
  Administered 2017-11-22: 5 mg via INTRAVENOUS
  Filled 2017-11-22: qty 5

## 2017-11-22 NOTE — NC FL2 (Signed)
[place] MEDICAID FL2 LEVEL OF CARE SCREENING TOOL     IDENTIFICATION  Patient Name: [name] Birthdate: [birth year] Sex: female Admission Date (Current Location): 11/17/2017  [facility] and [place] Number:  Producer, television/film/video and Address:  [facility] [place], [address], [place], [place] [address]      Provider Number: [id number]  Attending Physician Name and Address:  Starleen Arms, MD  Relative Name and Phone Number:  Rulon Eisenmenger [phone]     Current Level of Care: Hospital Recommended Level of Care: Skilled Nursing Facility Prior Approval Number:    Date Approved/Denied:   PASRR Number: [id number]  Discharge Plan: SNF    Current Diagnoses: Patient Active Problem List   Diagnosis Date Noted  . Leukocytosis 11/17/2017  . Sepsis (HCC) 11/17/2017  . DVT (deep venous thrombosis) Rt Leg 11/14/2017  . Pulmonary embolism (HCC) 11/13/2017  . Pulmonary emboli (HCC) 11/13/2017  . 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 Cerebrovascular disease 01/17/2015  . Encephalopathy, metabolic 12/29/2011  . Pyelonephritis, acute 12/29/2011  . Hypokalemia 12/29/2011  . Depression   . MS (multiple sclerosis) (HCC) 12/28/2011  . Fever 12/28/2011  . DM2 (diabetes mellitus, type 2) (HCC) 12/28/2011    Orientation RESPIRATION BLADDER Height & Weight     Self, Place  Normal Incontinent Weight: 75.9 kg (167 lb 5.3 oz) Height:     BEHAVIORAL SYMPTOMS/MOOD NEUROLOGICAL BOWEL NUTRITION STATUS      Incontinent Diet(Regular)  AMBULATORY STATUS COMMUNICATION OF NEEDS Skin   Extensive Assist Verbally Normal                       Personal Care Assistance Level of Assistance  Bathing, Feeding, Dressing Bathing Assistance: Maximum assistance Feeding assistance: Maximum assistance Dressing Assistance: Maximum assistance     Functional Limitations Info  Hearing, Speech, Sight Sight Info: Adequate Hearing Info:  Adequate Speech Info: Adequate    SPECIAL CARE FACTORS FREQUENCY  PT (By licensed PT), OT (By licensed OT)     PT Frequency: 3x/week OT Frequency: 2x/week            Contractures      Additional Factors Info  Code Status, Allergies, Psychotropic, Insulin Sliding Scale Code Status Info: Full Allergies Info: NKA Psychotropic Info: Clonidine Insulin Sliding Scale Info: 3x daily with meals       Current Medications (11/22/2017):  This is the current hospital active medication list Current Facility-Administered Medications  Medication Dose Route Frequency Provider Last Rate Last Dose  . 0.9 %  sodium chloride infusion   Intravenous Continuous Elgergawy, Leana Roe, MD 100 mL/hr at 11/22/17 0844    . acetaminophen (TYLENOL) tablet 650 mg  650 mg Oral Q6H PRN Marcos Eke, PA-C   650 mg at 11/17/17 2013   Or  . acetaminophen (TYLENOL) suppository 650 mg  650 mg Rectal Q6H PRN Marcos Eke, PA-C      . aspirin chewable tablet 81 mg  81 mg Oral Daily Marcos Eke, PA-C   81 mg at 11/22/17 0831  . atorvastatin (LIPITOR) tablet 20 mg  20 mg Oral q1800 Marcos Eke, PA-C   20 mg at 11/21/17 2258  . bisacodyl (DULCOLAX) suppository 10 mg  10 mg Rectal Daily PRN Marcos Eke, PA-C   10 mg at 11/20/17 1648  . cholecalciferol (VITAMIN D) tablet 800 Units  800 Units Oral Daily Marcos Eke, PA-C   800 Units at  11/22/17 4621  . cloNIDine (CATAPRES) tablet 0.1 mg  0.1 mg Oral BID Marcos Eke, PA-C   0.1 mg at 11/22/17 9471  . diphenhydrAMINE (BENADRYL) capsule 25 mg  25 mg Oral Q8H PRN Marcos Eke, PA-C      . ferrous sulfate tablet 325 mg  325 mg Oral Q breakfast Marcos Eke, PA-C   325 mg at 11/22/17 2527  . gabapentin (NEURONTIN) capsule 300 mg  300 mg Oral Q12H Marcos Eke, PA-C   300 mg at 11/22/17 1292  . heparin ADULT infusion 100 units/mL (25000 units/255mL sodium chloride 0.45%)  1,100 Units/hr Intravenous Continuous Stevphen Rochester, RPH 11 mL/hr at  11/22/17 0844 1,100 Units/hr at 11/22/17 0844  . hydrALAZINE (APRESOLINE) injection 10 mg  10 mg Intravenous Q6H PRN Bodenheimer, Charles A, NP   10 mg at 11/21/17 2110  . HYDROcodone-acetaminophen (NORCO/VICODIN) 5-325 MG per tablet 1-2 tablet  1-2 tablet Oral Q4H PRN Marcos Eke, PA-C   2 tablet at 11/19/17 0103  . hydroxypropyl methylcellulose / hypromellose (ISOPTO TEARS / GONIOVISC) 2.5 % ophthalmic solution 1 drop  1 drop Both Eyes BID Jonah Blue, MD   1 drop at 11/22/17 0835  . insulin aspart (novoLOG) injection 0-9 Units  0-9 Units Subcutaneous TID WC Marcos Eke, PA-C   1 Units at 11/22/17 9090  . latanoprost (XALATAN) 0.005 % ophthalmic solution 1 drop  1 drop Both Eyes QHS Marcos Eke, PA-C   1 drop at 11/21/17 2253  . MEDLINE mouth rinse  15 mL Mouth Rinse BID Elgergawy, Leana Roe, MD   15 mL at 11/22/17 0834  . metoprolol succinate (TOPROL-XL) 24 hr tablet 75 mg  75 mg Oral Daily Elgergawy, Leana Roe, MD   75 mg at 11/22/17 0834  . nitroGLYCERIN (NITROSTAT) SL tablet 0.4 mg  0.4 mg Sublingual Q5 min PRN Bodenheimer, Charles A, NP   0.4 mg at 11/19/17 0132  . ondansetron (ZOFRAN) tablet 4 mg  4 mg Oral Q6H PRN Marcos Eke, PA-C       Or  . ondansetron (ZOFRAN) injection 4 mg  4 mg Intravenous Q6H PRN Marcos Eke, PA-C      . piperacillin-tazobactam (ZOSYN) IVPB 3.375 g  3.375 g Intravenous Q8H Marcos Eke, PA-C 12.5 mL/hr at 11/22/17 0844    . polyethylene glycol (MIRALAX / GLYCOLAX) packet 17 g  17 g Oral Daily Marcos Eke, PA-C   17 g at 11/22/17 3014  . senna-docusate (Senokot-S) tablet 1 tablet  1 tablet Oral QHS PRN Marcos Eke, PA-C      . zinc oxide 20 % ointment 1 application  1 application Topical Daily PRN Marcos Eke, PA-C         Discharge Medications: Please see discharge summary for a list of discharge medications.  Relevant Imaging Results:  Relevant Lab Results:   Additional Information SSN: [id number] [place],  [place]

## 2017-11-22 NOTE — Progress Notes (Signed)
Pt BP persistently elevated at 146/100 with MAP 115, pulse 95. Administered PRN dose of hydralazine and nighttime dose of clonidine with little effect. Notified Schorr, NP. Will continue to monitor and await new orders.

## 2017-11-22 NOTE — Progress Notes (Signed)
ANTICOAGULATION CONSULT NOTE   Pharmacy Consult for Xarelto>>>>>Heparin  Indication: pulmonary embolus and DVT  No Known Allergies  Patient Measurements: Weight: 167 lb 5.3 oz (75.9 kg)  Vital Signs: Temp: 98.3 F (36.8 C) (07/09 0500) Temp Source: Oral (07/09 0500) BP: 115/77 (07/09 0419) Pulse Rate: 85 (07/09 0419)  Labs: Recent Labs    11/19/17 0731 11/19/17 1330  11/20/17 0534 11/21/17 0437 11/21/17 1605 11/22/17 0439  HGB  --   --    < > 9.9* 10.0*  --  9.0*  HCT  --   --   --  31.2* 31.2*  --  28.8*  PLT  --   --   --  263 288  --  273  APTT  --   --   --   --   --  74* 53*  HEPARINUNFRC  --   --   --  >2.20*  --  0.95* 0.45  CREATININE  --   --   --   --  2.99*  --   --   TROPONINI <0.03 0.03*  --   --   --   --   --    < > = values in this interval not displayed.    Estimated Creatinine Clearance: 20.7 mL/min (A) (by C-G formula based on SCr of 2.99 mg/dL (H)).   Medical History: Past Medical History:  Diagnosis Date  . Allergic rhinitis   . Blood dyscrasia   . Dementia   . Depression   . Diabetes mellitus   . Dysmetabolic syndrome X   . Family history of anesthesia complication    brother "  . Hypertension   . Hypokalemia   . Multiple sclerosis (HCC)   . Neuropathic pain   . Urinary incontinence   . Vitamin D deficiency    Assessment: 62 y/o F who was on heparin from 6/30>>7/2, transitioned to Apixaban and DC'd but didn't get any apixaban upon DC, she was started back on heparin 7/4, then transitioned to Xarelto on 7/7. She is now being transitioned back to heparin. Xarelto dose would be due now, so will start heparin immediately. Will need to use aPTT to dose for now given Xarelto influence on heparin levels. CBC stable.   7/9 AM update: aPTT sub-therapeutic at 53, heparin level still with some Xarelto influence, no issues per RN.   Goal of Therapy:  Heparin level 0.3-0.7 units/ml aPTT 66-102 seconds Monitor platelets by anticoagulation  protocol: Yes   Plan:  Inc heparin to 1100 units/hr  1400 aPTT/HL  Abran Duke, PharmD, BCPS Clinical Pharmacist Phone: [phone]

## 2017-11-22 NOTE — Progress Notes (Addendum)
CSW asked [facility]F for Sister's cell phone info, which was provided. CSW left voicemail for [facility] [place].  CSW attempted to reach family with no success to discuss SNF placement. Will attempt again.  Osborne Casco Nai Dasch LCSW [phone]

## 2017-11-22 NOTE — Progress Notes (Signed)
PROGRESS NOTE                                                                                                                                                                                                             Patient Demographics:    [name], is a 62 y.o. female, DOB - 1955-09-22, FMB:[id number]  Admit date - 11/17/2017   Admitting Physician Karmen Bongo, MD  Outpatient Primary MD for the patient is Reymundo Poll, MD  LOS - 5   No chief complaint on file.      Brief Narrative     61 y.o. female with medical history significant for multiple sclerosis bedbound, diabetes, hypertension, hyperlipidemia, and a recent hospitalization  for PE and DVT, she was discharged on 11/15/2017 on Eliquis to ALF, unclear at this point if she was taking her Eliquis or not, she presents secondary to tachycardia, she was needed noted to have SIRS, repeat CTA chest showing with increased clot burden .  No source of infection could be identified, as well she developed AKI most likely contrast induced nephropathy   Subjective:    Omaya Nieland today denies any complaints .  No fever, no chills, no shortness of breath or chest pain   Assessment  & Plan :    Active Problems:   MS (multiple sclerosis) (HCC)   Fever   DM2 (diabetes mellitus, type 2) (HCC)   Encephalopathy, metabolic   Depression   Pyelonephritis, acute   Pulmonary embolism (HCC)   DVT (deep venous thrombosis) Rt Leg   Leukocytosis   Sepsis ([place])  SIRS -SIRS criteria met on admission, leukocytosis, fever, tachycardia and tachypnea, and elevated lactic acid. -Septic work-up remains negative, no infectious etiology on urine analysis, as well as CT chest abdomen pelvis with no evidence of infectious etiology, -Blood cultures remain negative to date -Empirically on vancomycin and Zosyn on admission, stop vancomycin 11/19/2017, currently on Zosyn, given patient is clinically improving,  nontoxic-appearing, leukocytosis has resolved and she is afebrile, I will go ahead and DC Zosyn 11/22/2017 .  PE -Patient with recent diagnosis of PE, she was discharged on Eliquis, she was discharged to an assisted living facility, case manager confirmed, patient was taking her Eliquis at facility, appears her PE has progressed despite being on Eliquis, kept on heparin GTT for first 72 hours on admission,  transition to Xarelto, now back on heparin GTT in the setting of worsening renal function with a creatinine of 3 . -Creatinine appears to be improving, continue with heparin GTT today, hopefully can be transition to Xarelto tomorrow if her GFR > 30.  AKI -Creatinine 0.74 on admission, it has a climbed up to 2.99, urine sodium is 54, this is most likely contrast induced nephropathy as she did receive CTA chest on admission, lisinopril on hold, continue to hold nephrotoxic medications, it is improving, down to 2.6 today, he started to have some minimal lower extremity edema, so I will decrease her IV fluids to 75 cc.  Sinus tachycardia -Most likely in the setting of fever and SIRS, and PE . - improving after giving some fluids, and increasing her Toprol-XL  Type 2 diabetes mellitus -Continue with insulin sliding scale  Hypertension -Meds labile, but overall much improved after increasing her Toprol-XL from 50 to 75 mg daily   Hyperlipidemia Continue home statins  Anemia of chronic disease,  -Continue to monitor H&H closely as she is on anticoagulation   History of MS,  - with neuromuscular deficits, the patient does interferon beta injections 30 mcg q. 7 days, supportive care, home health nursing and physical therapy Follow up with neurology as outpatient     Code Status : DNR  Family Communication  : D/W with sister via phone 12/02/2017  Disposition Plan  : Likely will need SNF placement this time, not ALF, I have discussed with sister who is agreeable  Consults  :   none  Procedures  : none  DVT Prophylaxis  :  On heparin GTT  Lab Results  Component Value Date   PLT 273 11/22/2017    Antibiotics  :    Anti-infectives (From admission, onward)   Start     Dose/Rate Route Frequency Ordered Stop   11/20/17 0230  vancomycin (VANCOCIN) IVPB 1000 mg/200 mL premix  Status:  Discontinued     1,000 mg 200 mL/hr over 60 Minutes Intravenous Every 24 hours 11/19/17 1138 11/19/17 1609   11/18/17 0200  vancomycin (VANCOCIN) IVPB 1000 mg/200 mL premix  Status:  Discontinued     1,000 mg 200 mL/hr over 60 Minutes Intravenous Every 12 hours 11/17/17 1429 11/19/17 1138   11/17/17 2200  piperacillin-tazobactam (ZOSYN) IVPB 3.375 g     3.375 g 12.5 mL/hr over 240 Minutes Intravenous Every 8 hours 11/17/17 1429     11/17/17 1300  piperacillin-tazobactam (ZOSYN) IVPB 3.375 g     3.375 g 100 mL/hr over 30 Minutes Intravenous  Once 11/17/17 1255 11/17/17 1352   11/17/17 1300  vancomycin (VANCOCIN) IVPB 1000 mg/200 mL premix     1,000 mg 200 mL/hr over 60 Minutes Intravenous  Once 11/17/17 1255 11/17/17 1501        Objective:   Vitals:   11/22/17 0500 11/22/17 0818 11/22/17 0832 11/22/17 0834  BP:  (!) 141/89 (!) 141/89   Pulse:  77  79  Resp:  17    Temp: 98.3 F (36.8 C) 98.5 F (36.9 C)    TempSrc: Oral Oral    SpO2:  95%    Weight:        Wt Readings from Last 3 Encounters:  11/21/17 75.9 kg (167 lb 5.3 oz)  11/13/17 75.9 kg (167 lb 5.3 oz)  01/21/17 78.9 kg (174 lb)     Intake/Output Summary (Last 24 hours) at 11/22/2017 1134 Last data filed at 11/22/2017 0939 Gross per 24 hour  Intake 3475.28 ml  Output 1201 ml  Net 2274.28 ml     Physical Exam  Awake Alert, pleasant, in no apparent distress  symmetrical Chest wall movement, Good air movement bilaterally, CTAB RRR,No Gallops,Rubs or new Murmurs, No Parasternal Heave +ve B.Sounds, Abd Soft, No tenderness, No rebound - guarding or rigidity. No Cyanosis, Clubbing or edema, No new Rash  or bruise      Data Review:    CBC Recent Labs  Lab 11/17/17 1155 11/17/17 1911 11/18/17 0731 11/19/17 0034 11/20/17 0534 11/21/17 0437 11/22/17 0439  WBC 22.1* 21.8* 19.6* 17.4* 15.5* 10.6* 10.9*  HGB 11.8* 11.9* 11.5* 10.1* 9.9* 10.0* 9.0*  HCT 38.1 37.4 36.4 31.8* 31.2* 31.2* 28.8*  PLT 277 249 240 208 263 288 273  MCV 81.4 79.9 80.0 79.3 79.6 79.0 79.8  MCH 25.2* 25.4* 25.3* 25.2* 25.3* 25.3* 24.9*  MCHC 31.0 31.8 31.6 31.8 31.7 32.1 31.3  RDW 15.1 15.0 15.0 14.8 15.1 15.1 15.2  LYMPHSABS 1.7 1.8  --   --   --   --   --   MONOABS 2.5* 2.4*  --   --   --   --   --   EOSABS 0.0 0.0  --   --   --   --   --   BASOSABS 0.1 0.1  --   --   --   --   --     Chemistries  Recent Labs  Lab 11/17/17 1155 11/18/17 0731 11/19/17 0034 11/21/17 0437 11/22/17 0439  NA 142 143 139 141 139  K 3.7 3.5 3.8 3.8 3.4*  CL 109 108 107 109 109  CO2 _0 15*  GLUCOSE 221* 160* 168* 131* 149*  BUN _1 36* 33*  CREATININE 0.92 0.74 1.61* 2.99* 2.62*  CALCIUM 9.1 8.9 8.3* 8.4* 7.8*  AST 42*  --   --   --   --   ALT 58*  --   --   --   --   ALKPHOS 70  --   --   --   --   BILITOT 0.7  --   --   --   --    ------------------------------------------------------------------------------------------------------------------ No results for input(s): CHOL, HDL, LDLCALC, TRIG, CHOLHDL, LDLDIRECT in the last 72 hours.  Lab Results  Component Value Date   HGBA1C 6.1 (H) 12/29/2011   ------------------------------------------------------------------------------------------------------------------ No results for input(s): TSH, T4TOTAL, T3FREE, THYROIDAB in the last 72 hours.  Invalid input(s): FREET3 ------------------------------------------------------------------------------------------------------------------ No results for input(s): VITAMINB12, FOLATE, FERRITIN, TIBC, IRON, RETICCTPCT in the last 72 hours.  Coagulation profile Recent Labs  Lab 11/17/17 1602 11/17/17 1911   INR 1.17 1.24    No results for input(s): DDIMER in the last 72 hours.  Cardiac Enzymes Recent Labs  Lab 11/19/17 0114 11/19/17 0731 11/19/17 1330  TROPONINI 0.04* <0.03 0.03*   ------------------------------------------------------------------------------------------------------------------ No results found for: BNP  Inpatient Medications  Scheduled Meds: . aspirin  81 mg Oral Daily  . atorvastatin  20 mg Oral q1800  . cholecalciferol  800 Units Oral Daily  . cloNIDine  0.1 mg Oral BID  . ferrous sulfate  325 mg Oral Q breakfast  . gabapentin  300 mg Oral Q12H  . hydroxypropyl methylcellulose / hypromellose  1 drop Both Eyes BID  . insulin aspart  0-9 Units Subcutaneous TID WC  . latanoprost  1 drop Both Eyes QHS  . mouth rinse  15 mL Mouth Rinse BID  . metoprolol succinate  75 mg Oral Daily  . polyethylene glycol  17 g Oral Daily   Continuous Infusions: . sodium chloride 100 mL/hr at 11/22/17 0844  . heparin 1,100 Units/hr (11/22/17 0844)  . piperacillin-tazobactam (ZOSYN)  IV 12.5 mL/hr at 11/22/17 0844   PRN Meds:.acetaminophen **OR** acetaminophen, bisacodyl, diphenhydrAMINE, hydrALAZINE, HYDROcodone-acetaminophen, nitroGLYCERIN, ondansetron **OR** ondansetron (ZOFRAN) IV, senna-docusate, zinc oxide  Micro Results Recent Results (from the past 240 hour(s))  MRSA PCR Screening     Status: None   Collection Time: 11/13/17  2:53 PM  Result Value Ref Range Status   MRSA by PCR NEGATIVE NEGATIVE Final    Comment:        The GeneXpert MRSA Assay (FDA approved for NASAL specimens only), is one component of a comprehensive MRSA colonization surveillance program. It is not intended to diagnose MRSA infection nor to guide or monitor treatment for MRSA infections. Performed at [facility], [place] [address] [place], [place] [address]   Blood Culture (routine x 2)     Status: None (Preliminary result)   Collection Time: 11/17/17 12:33 PM  Result Value Ref  Range Status   Specimen Description BLOOD LEFT ANTECUBITAL  Final   Special Requests   Final    BOTTLES DRAWN AEROBIC AND ANAEROBIC Blood Culture results may not be optimal due to an inadequate volume of blood received in culture bottles   Culture   Final    NO GROWTH 4 DAYS Performed at [facility], [place] [address] [place], [place] [address]    Report Status PENDING  Incomplete  Blood Culture (routine x 2)     Status: None (Preliminary result)   Collection Time: 11/17/17  1:16 PM  Result Value Ref Range Status   Specimen Description BLOOD RIGHT HAND  Final   Special Requests   Final    BOTTLES DRAWN AEROBIC AND ANAEROBIC Blood Culture adequate volume   Culture   Final    NO GROWTH 4 DAYS Performed at [facility], [place] [address], [place], [place] [address]    Report Status PENDING  Incomplete  Culture, blood (x 2)     Status: None (Preliminary result)   Collection Time: 11/17/17  4:16 PM  Result Value Ref Range Status   Specimen Description BLOOD LEFT ANTECUBITAL  Final   Special Requests   Final    BOTTLES DRAWN AEROBIC AND ANAEROBIC Blood Culture results may not be optimal due to an inadequate volume of blood received in culture bottles   Culture   Final    NO GROWTH 4 DAYS Performed at [facility], [place] [address], [place], [place] [address]    Report Status PENDING  Incomplete  Culture, blood (x 2)     Status: None (Preliminary result)   Collection Time: 11/17/17  4:21 PM  Result Value Ref Range Status   Specimen Description BLOOD RIGHT HAND  Final   Special Requests   Final    BOTTLES DRAWN AEROBIC AND ANAEROBIC Blood Culture adequate volume   Culture   Final    NO GROWTH 4 DAYS Performed at [facility], [place] [address] [place], [place] [address]    Report Status PENDING  Incomplete    Radiology Reports Ct Abdomen Pelvis Wo Contrast  Result Date: 11/17/2017 CLINICAL DATA:  62 year old female with acute abdominal pain and diarrhea. EXAM: CT  ABDOMEN AND PELVIS WITHOUT CONTRAST TECHNIQUE: Multidetector CT imaging of the abdomen and pelvis was performed following the standard protocol  without IV contrast. COMPARISON:  11/12/2017 CT FINDINGS: Please note that parenchymal abnormalities may be missed without intravenous contrast. Lower chest: Bibasilar atelectasis again noted. Elevated LEFT hemidiaphragm again noted. Hepatobiliary: The liver and gallbladder are unremarkable. No definite biliary dilatation. Pancreas: Unremarkable Spleen: Unremarkable Adrenals/Urinary Tract: The kidneys, adrenal glands and bladder are unremarkable. Stomach/Bowel: Stomach is within normal limits. No evidence of bowel wall thickening, distention, or inflammatory changes. Vascular/Lymphatic: Aortic atherosclerosis. No enlarged abdominal or pelvic lymph nodes. Reproductive: Status post hysterectomy. No adnexal masses. Other: No ascites, focal collection or pneumoperitoneum. Musculoskeletal: No acute or suspicious bony abnormality. Apex LEFT lumbar scoliosis and moderate degenerative disc disease at L3-4 again identified. IMPRESSION: 1. No evidence of acute abnormality within the abdomen or pelvis. 2. Bibasilar atelectasis again noted 3.  Aortic Atherosclerosis (ICD10-I70.0). Electronically Signed   By: Margarette Canada M.D.   On: 11/17/2017 15:39   Dg Chest 2 View  Result Date: 11/12/2017 CLINICAL DATA:  Arm pain. EXAM: CHEST - 2 VIEW COMPARISON:  Chest x-ray dated December 28, 2011. FINDINGS: The heart size and mediastinal contours are within normal limits. Normal pulmonary vascularity. No focal consolidation, pleural effusion, or pneumothorax. Unchanged elevation of the left hemidiaphragm. No acute osseous abnormality. IMPRESSION: No active cardiopulmonary disease. Electronically Signed   By: Titus Dubin M.D.   On: 11/12/2017 19:56   Ct Angio Chest Pe W Or Wo Contrast  Result Date: 11/17/2017 CLINICAL DATA:  Tachycardia.  Recent PE/DVT. EXAM: CT ANGIOGRAPHY CHEST WITH  CONTRAST TECHNIQUE: Multidetector CT imaging of the chest was performed using the standard protocol during bolus administration of intravenous contrast. Multiplanar CT image reconstructions and MIPs were obtained to evaluate the vascular anatomy. CONTRAST:  15m ISOVUE-370 IOPAMIDOL (ISOVUE-370) INJECTION 76% COMPARISON:  11/12/2016 FINDINGS: Cardiovascular: Heart is normal in size. Calcified plaque over the left anterior descending and right coronary arteries. Mild calcified plaque over the thoracic aorta. Pulmonary arterial system is well opacified demonstrates no significant change in moderate emboli over the right lower lobar pulmonary arteries with slight improvement in the proximal right upper lobar arteries and emboli over the right middle lobar pulmonary artery. New pulmonary emboli over the left upper lobe and proximal left lower lobar pulmonary arteries. RV/LV ratio is 33.1/35.4 is 0.93. Mediastinum/Nodes: No mediastinal or hilar adenopathy. Remaining mediastinal structures are within normal. Lungs/Pleura: Elevated left hemidiaphragm with slight worsening opacification left base likely atelectasis. Right lung is clear. Airways are normal. Upper Abdomen: No acute findings. Calcified plaque over the abdominal aorta. Musculoskeletal: Unchanged. Review of the MIP images confirms the above findings. IMPRESSION: Known right-sided pulmonary emboli without significant overall change and new pulmonary emboli over the proximal right upper and lower lobar pulmonary arteries. Positive for acute PE with CT evidence of right heart strain (RV/LV Ratio = 0.93) consistent with at least submassive (intermediate risk) PE. The presence of right heart strain has been associated with an increased risk of morbidity and mortality. Please activate Code PE by paging [phone] Slight worsening opacification left base likely atelectasis. Atherosclerotic coronary artery disease. Aortic Atherosclerosis (ICD10-I70.0). These results  will be called to the ordering clinician or representative by the Radiologist Assistant, and communication documented in the PACS or zVision Dashboard. Electronically Signed   By: DMarin OlpM.D.   On: 11/17/2017 22:15   Ct Angio Chest Pe W/cm &/or Wo Cm  Result Date: 11/12/2017 CLINICAL DATA:  Right cheek and arm cramping. Chest pain and tachycardia. EXAM: CT ANGIOGRAPHY CHEST CT ABDOMEN AND PELVIS WITH CONTRAST TECHNIQUE: Multidetector CT  imaging of the chest was performed using the standard protocol during bolus administration of intravenous contrast. Multiplanar CT image reconstructions and MIPs were obtained to evaluate the vascular anatomy. Multidetector CT imaging of the abdomen and pelvis was performed using the standard protocol during bolus administration of intravenous contrast. CONTRAST:  153m ISOVUE-300 IOPAMIDOL (ISOVUE-300) INJECTION 61% COMPARISON:  CXR 11/12/2017 FINDINGS: CTA CHEST FINDINGS Cardiovascular: Acute pulmonary emboli at the branch point of the right main pulmonary embolus extending into the right lobar level in the upper lobe and segmental level to the right lower lobe. RV/LV ratio elevated at 0.79. Left ventricular hypertrophy is seen. No pericardial effusion. No aortic aneurysm or dissection. Mild ectasia of the ascending aorta to 3.7 cm. Common branch point of the right brachiocephalic and left common carotid arteries without stenosis of the great vessels. Coronary arteriosclerosis is noted along the LAD. Mediastinum/Nodes: No mediastinal nor hilar adenopathy. Trachea is unremarkable. Mainstem bronchi are patent. Lungs/Pleura: Dependent atelectasis. No acute pulmonary abnormality. No effusion or pneumothorax. No dominant mass. Elevated left hemidiaphragm. Musculoskeletal: No chest wall abnormality. No acute or significant osseous findings. Review of the MIP images confirms the above findings. CT ABDOMEN and PELVIS FINDINGS Hepatobiliary: No focal liver abnormality is seen. No  gallstones, gallbladder wall thickening, or biliary dilatation. Pancreas: Unremarkable. No pancreatic ductal dilatation or surrounding inflammatory changes. Spleen: Normal size spleen without mass. Punctate calcification consistent with a tiny granuloma is noted within. Adrenals/Urinary Tract: Normal bilateral adrenal glands. Symmetric cortical enhancement of kidneys without enhancing mass lesions. No nephrolithiasis nor hydroureteronephrosis. The urinary bladder is unremarkable for the degree of distention. Stomach/Bowel: Small hiatal hernia. Decompressed stomach with normal small bowel rotation. No bowel obstruction or inflammation. The terminal and distal ileum are normal. The appendix is not well visualized but no pericecal inflammatory change is seen. Moderate stool retention is noted within colon. Vascular/Lymphatic: Moderate aortoiliac atherosclerosis without aneurysm. No adenopathy. Reproductive: Hysterectomy.  No adnexal mass. Other: Tiny fat containing umbilical hernia. No free air nor free fluid. Musculoskeletal: Degenerative disc disease L3-4. No acute osseous abnormality. Review of the MIP images confirms the above findings. IMPRESSION: Chest CT: 1. Positive for acute PE to the right upper and lower lobes with CT evidence of right heart strain (RV/LV Ratio = 0.78) consistent with at least submassive (intermediate risk) PE. The presence of right heart strain has been associated with an increased risk of morbidity and mortality. Please activate Code PE by paging [phone] These results were called by telephone at the time of interpretation on 11/12/2017 at 11:43 pm to [facility], who verbally acknowledged these results. 2. No active pulmonary disease. 3. Coronary arteriosclerosis along the LAD. 4. Mild aortic atherosclerosis without aneurysm or dissection. CT AP: 1. No acute solid nor hollow visceral organ pathology. 2. Degenerative disc disease L3-4. Electronically Signed   By: DAshley RoyaltyM.D.    On: 11/12/2017 23:44   Ct Abdomen Pelvis W Contrast  Result Date: 11/12/2017 CLINICAL DATA:  Right cheek and arm cramping. Chest pain and tachycardia. EXAM: CT ANGIOGRAPHY CHEST CT ABDOMEN AND PELVIS WITH CONTRAST TECHNIQUE: Multidetector CT imaging of the chest was performed using the standard protocol during bolus administration of intravenous contrast. Multiplanar CT image reconstructions and MIPs were obtained to evaluate the vascular anatomy. Multidetector CT imaging of the abdomen and pelvis was performed using the standard protocol during bolus administration of intravenous contrast. CONTRAST:  1048mISOVUE-300 IOPAMIDOL (ISOVUE-300) INJECTION 61% COMPARISON:  CXR 11/12/2017 FINDINGS: CTA CHEST FINDINGS Cardiovascular: Acute pulmonary  emboli at the branch point of the right main pulmonary embolus extending into the right lobar level in the upper lobe and segmental level to the right lower lobe. RV/LV ratio elevated at 0.79. Left ventricular hypertrophy is seen. No pericardial effusion. No aortic aneurysm or dissection. Mild ectasia of the ascending aorta to 3.7 cm. Common branch point of the right brachiocephalic and left common carotid arteries without stenosis of the great vessels. Coronary arteriosclerosis is noted along the LAD. Mediastinum/Nodes: No mediastinal nor hilar adenopathy. Trachea is unremarkable. Mainstem bronchi are patent. Lungs/Pleura: Dependent atelectasis. No acute pulmonary abnormality. No effusion or pneumothorax. No dominant mass. Elevated left hemidiaphragm. Musculoskeletal: No chest wall abnormality. No acute or significant osseous findings. Review of the MIP images confirms the above findings. CT ABDOMEN and PELVIS FINDINGS Hepatobiliary: No focal liver abnormality is seen. No gallstones, gallbladder wall thickening, or biliary dilatation. Pancreas: Unremarkable. No pancreatic ductal dilatation or surrounding inflammatory changes. Spleen: Normal size spleen without mass.  Punctate calcification consistent with a tiny granuloma is noted within. Adrenals/Urinary Tract: Normal bilateral adrenal glands. Symmetric cortical enhancement of kidneys without enhancing mass lesions. No nephrolithiasis nor hydroureteronephrosis. The urinary bladder is unremarkable for the degree of distention. Stomach/Bowel: Small hiatal hernia. Decompressed stomach with normal small bowel rotation. No bowel obstruction or inflammation. The terminal and distal ileum are normal. The appendix is not well visualized but no pericecal inflammatory change is seen. Moderate stool retention is noted within colon. Vascular/Lymphatic: Moderate aortoiliac atherosclerosis without aneurysm. No adenopathy. Reproductive: Hysterectomy.  No adnexal mass. Other: Tiny fat containing umbilical hernia. No free air nor free fluid. Musculoskeletal: Degenerative disc disease L3-4. No acute osseous abnormality. Review of the MIP images confirms the above findings. IMPRESSION: Chest CT: 1. Positive for acute PE to the right upper and lower lobes with CT evidence of right heart strain (RV/LV Ratio = 0.78) consistent with at least submassive (intermediate risk) PE. The presence of right heart strain has been associated with an increased risk of morbidity and mortality. Please activate Code PE by paging [phone]. These results were called by telephone at the time of interpretation on 11/12/2017 at 11:43 pm to PA [facility] , who verbally acknowledged these results. 2. No active pulmonary disease. 3. Coronary arteriosclerosis along the LAD. 4. Mild aortic atherosclerosis without aneurysm or dissection. CT AP: 1. No acute solid nor hollow visceral organ pathology. 2. Degenerative disc disease L3-4. Electronically Signed   By: Ashley Royalty M.D.   On: 11/12/2017 23:44   Dg Chest Port 1 View  Result Date: 11/17/2017 CLINICAL DATA:  Fever EXAM: PORTABLE CHEST 1 VIEW COMPARISON:  CT chest 11/12/2017.  Chest x-ray 11/12/2017. FINDINGS: 1255  hours. The cardiopericardial silhouette is within normal limits for size. The visualized bony structures of the thorax are intact. Telemetry leads overlie the chest. Fairly prominent gaseous distention of colon visualized in the upper abdomen. Marked asymmetric elevation left hemidiaphragm with adjacent atelectasis in the left lung base. IMPRESSION: Asymmetric elevation left hemidiaphragm with adjacent left base atelectasis. Diffuse gaseous distention of colon within the visualized upper abdomen. Electronically Signed   By: Misty Stanley M.D.   On: 11/17/2017 13:07     Phillips Climes M.D on 11/22/2017 at 11:34 AM  Between 7am to 7pm - Pager - [phone]  After 7pm go to [email or web address] - password [facility]  [facility] -  Office  [phone]

## 2017-11-22 NOTE — Progress Notes (Signed)
ANTICOAGULATION  CONSULT NOTE - Follow Up Consult  Pharmacy Consult for Heparin (Xarelto on hold) Indication: pulmonary embolus and DVT  No Known Allergies  Patient Measurements: Height: 5\' 7"  (170.2 cm) Weight: 167 lb 5.3 oz (75.9 kg) IBW/kg (Calculated) : 61.6 Heparin Dosing Weight: 76 kg  Vital Signs: Temp: 98.3 F (36.8 C) (07/09 1219) Temp Source: Oral (07/09 1219) BP: 110/68 (07/09 1219) Pulse Rate: 79 (07/09 0834)  Labs: Recent Labs    11/20/17 0534 11/21/17 0437 11/21/17 1605 11/22/17 0439 11/22/17 1318  HGB 9.9* 10.0*  --  9.0*  --   HCT 31.2* 31.2*  --  28.8*  --   PLT 263 288  --  273  --   APTT  --   --  74* 53* 106*  HEPARINUNFRC >2.20*  --  0.95* 0.45 0.80*  CREATININE  --  2.99*  --  2.62*  --     Estimated Creatinine Clearance: 23.7 mL/min (A) (by C-G formula based on SCr of 2.62 mg/dL (H)).   Assessment:  62 y/o F who was on heparin from 6/30>>7/2, transitioned to Apixaban and discharged but did not get any apixaban upon DC. She was started back on heparin on admit 7/4, then transitioned to Xarelto on 7/7. Transitioned back to heparin on 7/8 with AKI. Last Xarelto dose 7/7 at 7pm (had 2 doses on 7/7). Using aPTT to monitor heparin for now given Xarelto influence on heparin levels.      Heparin level 0.80 and aPTT 106 seconds (just above goals) this afternoon on heparin at 1100 units/hr.  Drip was increased from 1000 units/hr early this am when heparin level 0.45 and aPTT 53 seconds (just below goals).     Goal of Therapy:  Heparin level 0.3-0.7 units/ml aPTT 66-102 seconds Monitor platelets by anticoagulation protocol: Yes    Plan:   Decrease heparin drip to 1050 units/hr.  Next aPTT, heparin level and CBC in am.  Follow up transition back to Xarelto when renal function allows/ crcl > 30 ml/min.  Dennie Fetters, RPh Pager: [phone] or phone: [phone] 11/22/2017,2:54 PM

## 2017-11-22 NOTE — Progress Notes (Signed)
PT Cancellation and Discharge Note  Patient Details Name: LAQUENTA YA MRN: [id number] DOB: 05-02-1956   Cancelled Treatment:    Reason Eval/Treat Not Completed: PT screened, no needs identified, will sign off   Per PT note on 7/7:  Patient evaluated by Physical Therapy with no further acute PT needs identified. Patient is presenting at baseline with mobility (bedbound). Oriented to self only. Requiring two person total assistance for all bed mobility and sitting balance. Displaying chronic knee flexion contractures. Recommended use of Maxi Move for staff to use for transferring patient.   Will defer further PT to the SNF where she resides as they are more familiar with her;   Van Clines, PT  Acute Rehabilitation Services Pager [phone] Office [phone]     Levi Aland 11/22/2017, 8:41 AM

## 2017-11-23 ENCOUNTER — Encounter (HOSPITAL_COMMUNITY): Payer: Self-pay | Admitting: General Practice

## 2017-11-23 ENCOUNTER — Other Ambulatory Visit: Payer: Self-pay

## 2017-11-23 DIAGNOSIS — R509 Fever, unspecified: Secondary | ICD-10-CM

## 2017-11-23 DIAGNOSIS — G35 Multiple sclerosis: Secondary | ICD-10-CM

## 2017-11-23 DIAGNOSIS — G9341 Metabolic encephalopathy: Secondary | ICD-10-CM

## 2017-11-23 DIAGNOSIS — I82402 Acute embolism and thrombosis of unspecified deep veins of left lower extremity: Secondary | ICD-10-CM

## 2017-11-23 LAB — GLUCOSE, CAPILLARY
GLUCOSE-CAPILLARY: 147 mg/dL — AB (ref 70–99)
GLUCOSE-CAPILLARY: 132 mg/dL — AB (ref 70–99)
GLUCOSE-CAPILLARY: 180 mg/dL — AB (ref 70–99)
Glucose-Capillary: 120 mg/dL — ABNORMAL HIGH (ref 70–99)

## 2017-11-23 LAB — CBC
HCT: 30.1 % — ABNORMAL LOW (ref 36.0–46.0)
Hemoglobin: 9.6 g/dL — ABNORMAL LOW (ref 12.0–15.0)
MCH: 25.3 pg — AB (ref 26.0–34.0)
MCHC: 31.9 g/dL (ref 30.0–36.0)
MCV: 79.4 fL (ref 78.0–100.0)
PLATELETS: 276 10*3/uL (ref 150–400)
RBC: 3.79 MIL/uL — AB (ref 3.87–5.11)
RDW: 15.5 % (ref 11.5–15.5)
WBC: 13.3 10*3/uL — ABNORMAL HIGH (ref 4.0–10.5)

## 2017-11-23 LAB — BASIC METABOLIC PANEL
Anion gap: 9 (ref 5–15)
BUN: 28 mg/dL — ABNORMAL HIGH (ref 8–23)
CO2: 21 mmol/L — AB (ref 22–32)
CREATININE: 2.37 mg/dL — AB (ref 0.44–1.00)
Calcium: 8.1 mg/dL — ABNORMAL LOW (ref 8.9–10.3)
Chloride: 112 mmol/L — ABNORMAL HIGH (ref 98–111)
GFR calc non Af Amer: 21 mL/min — ABNORMAL LOW (ref >60)
GFR, EST AFRICAN AMERICAN: 24 mL/min — AB (ref >60)
GLUCOSE: 140 mg/dL — AB (ref 70–99)
Potassium: 4.2 mmol/L (ref 3.5–5.1)
Sodium: 142 mmol/L (ref 135–145)

## 2017-11-23 LAB — APTT: APTT: 96 s — AB (ref 24–36)

## 2017-11-23 LAB — HEPARIN LEVEL (UNFRACTIONATED): Heparin Unfractionated: 0.49 IU/mL (ref 0.30–0.70)

## 2017-11-23 MED ORDER — SODIUM CHLORIDE 0.9 % IV SOLN
INTRAVENOUS | Status: DC
  Administered 2017-11-23 – 2017-11-24 (×2): via INTRAVENOUS

## 2017-11-23 MED ORDER — CLONIDINE HCL 0.1 MG PO TABS
0.1000 mg | ORAL_TABLET | Freq: Three times a day (TID) | ORAL | Status: DC
  Administered 2017-11-23 – 2017-11-24 (×3): 0.1 mg via ORAL
  Filled 2017-11-23 (×3): qty 1

## 2017-11-23 MED ORDER — HYDRALAZINE HCL 20 MG/ML IJ SOLN
10.0000 mg | Freq: Once | INTRAMUSCULAR | Status: AC
  Administered 2017-11-23: 10 mg via INTRAVENOUS
  Filled 2017-11-23: qty 1

## 2017-11-23 NOTE — Progress Notes (Signed)
MD paged about patient's BP of 196/103.

## 2017-11-23 NOTE — Progress Notes (Signed)
ANTICOAGULATION  CONSULT NOTE - Follow Up Consult  Pharmacy Consult for Heparin (Xarelto on hold) Indication: pulmonary embolus and DVT  No Known Allergies  Patient Measurements: Height: 5\' 7"  (170.2 cm) Weight: 177 lb 4 oz (80.4 kg) IBW/kg (Calculated) : 61.6 Heparin Dosing Weight: 76 kg  Vital Signs: Temp: 98.8 F (37.1 C) (07/10 1235) Temp Source: Oral (07/10 1235) BP: 171/97 (07/10 1235) Pulse Rate: 86 (07/10 1235)  Labs: Recent Labs    11/21/17 0437  11/22/17 0439 11/22/17 1318 11/23/17 0555  HGB 10.0*  --  9.0*  --  9.6*  HCT 31.2*  --  28.8*  --  30.1*  PLT 288  --  273  --  276  APTT  --    < > 53* 106* 96*  HEPARINUNFRC  --    < > 0.45 0.80* 0.49  CREATININE 2.99*  --  2.62*  --  2.37*   < > = values in this interval not displayed.    Estimated Creatinine Clearance: 26.8 mL/min (A) (by C-G formula based on SCr of 2.37 mg/dL (H)).   Assessment:  62 y/o F who was on heparin from 6/30>>7/2, transitioned to Apixaban and discharged but did not get any apixaban upon DC. She was started back on heparin on admit 7/4, then transitioned to Xarelto on 7/7. Transitioned back to heparin on 7/8 with AKI. Last Xarelto dose 7/7 at 7pm (had 2 doses on 7/7). Using aPTT to monitor heparin for now given Xarelto influence on heparin levels.    Heparin level 0.49 and aPTT 96 seconds both are at therapeutic goal on 1050 nits/hr heparin drip. Effect of Xarelto doses on antiXa level appears to have diminished.  No bleeding noted.   H/H low/stable and pltc wnl.   Goal of Therapy:  Heparin level 0.3-0.7 units/ml aPTT 66-102 seconds Monitor platelets by anticoagulation protocol: Yes    Plan:  Continue IV heparin drip 1050 units/hr. Next aPTT, heparin level and CBC in am.  Follow up transition back to Xarelto when renal function allows/ crcl > 30 ml/min.  Noah Delaine, RPh Clinical Pharmacist Please check AMION for all [facility] phone numbers After 10:00 PM, call Main Pharmacy  [phone]  11/23/2017,3:11 PM

## 2017-11-23 NOTE — Progress Notes (Signed)
PROGRESS NOTE  [name]  ZOX:[id number] DOB: 07-13-1955 DOA: 11/17/2017 PCP: Florentina Jenny, MD  Brief Narrative: [name] is a 62 y.o. female with a history of MS, bedbound, T2DM, HTN, HLD, and recent hospitalization for DVT and PE discharged 7/2 to ALF on eliquis who returned to the ED 7/4 with fever noted to have increased clot burden on repeat CTA chest. No source of infection has been found. She was put on xarelto initially, transitioned to heparin infusion with worsening AKI due to suspected contrast nephropathy.   Assessment & Plan: Active Problems:   MS (multiple sclerosis) (HCC)   Fever   DM2 (diabetes mellitus, type 2) (HCC)   Encephalopathy, metabolic   Depression   Pyelonephritis, acute   Pulmonary embolism (HCC)   DVT (deep venous thrombosis) Rt Leg   Leukocytosis   Sepsis (HCC)  SIRS: Suspected to be due to VTE without infectious source identified on urinalysis, CT chest, abdomen, pelvis, or on exam.  - Continue to monitor off antibiotics (stopped vancomycin 7/6, stopped zosyn 7/9.  - Monitor blood cultures (remain negative)  PE: Progressive clot burden despite reported adherence to eliquis, considered a failed trial.  - Initially on heparin gtt x72hrs, then transitioned to xarelto, back to heparin 7/7 with worsening renal function. - Plan to restart xarelto once CrCl back >47ml/min with assumption that renal function will return to/near normal with measures below.  AKI: Cr 0.74 on admission, up to 2.99. Suspect contrast-induced nephropathy, also possibly related to vancomycin and zosyn - Limit nephrotoxins. Hold lisinopril.  - Continue IVF's as able with trace LE edema. Increase rate to 100cc/hr  HTN: Elevated.  - Continue increased metoprolol. - Continue clonidine, will increase to TID, and likely to double dose TID tmrw.  - Hold lisinopril 20mg  as above - Hydralazine IV prn  T2DM:  - Hold metformin - Continue SSI  Sinus tachycardia: Improved, due to  PE.  - Continue increased beta blocker  Hyperlipidemia:  - Continue statin  Anemia of chronic disease and iron deficiency anemia:  - Monitor - Continue po iron  MS:  - Follow up with neurology as outpatient for management including IFN  - DC to SNF  DVT prophylaxis: Heparin gtt Code Status: DNR Family Communication: None at bedside Disposition Plan: SNF  Consultants:   Pharmacy  Procedures:   None  Antimicrobials:  Vancomycin, zosyn    Subjective: No complaints. Denies dyspnea, says breathing is better than admission. No chest pain or any other pain. Eating fine.   Objective: Vitals:   11/23/17 1505 11/23/17 1705 11/23/17 1710 11/23/17 1715  BP: (!) 193/116 (!) 190/117 (!) 196/103   Pulse:  96 96 97  Resp: 13 20 (!) 22 (!) 34  Temp:  98.6 F (37 C)    TempSrc:  Oral    SpO2:  99% 99% 99%  Weight:      Height:        Intake/Output Summary (Last 24 hours) at 11/23/2017 1744 Last data filed at 11/23/2017 1538 Gross per 24 hour  Intake 2161.51 ml  Output 800 ml  Net 1361.51 ml   Filed Weights   11/19/17 0458 11/21/17 0500 11/23/17 0406  Weight: 76.5 kg (168 lb 10.4 oz) 75.9 kg (167 lb 5.3 oz) 80.4 kg (177 lb 4 oz)    Gen: 62 y.o. female in no distress Pulm: Non-labored tachypnea with supplemental oxygen. Clear to auscultation bilaterally.  CV: Regular rate and rhythm. No murmur, rub, or gallop. No JVD, no  significant pedal edema. GI: Abdomen soft, non-tender, non-distended, with normoactive bowel sounds. No organomegaly or masses felt. Ext: Warm, no deformities Skin: No rashes, lesions or ulcers Neuro: Alert, only oriented x1 but conversant with stable neuro deficits. Psych: Judgement and insight appear normal. Mood & affect appropriate.   Data Reviewed: I have personally reviewed following labs and imaging studies  CBC: Recent Labs  Lab 11/17/17 1155 11/17/17 1911  11/19/17 0034 11/20/17 0534 11/21/17 0437 11/22/17 0439 11/23/17 0555  WBC  22.1* 21.8*   < > 17.4* 15.5* 10.6* 10.9* 13.3*  NEUTROABS 17.4* 17.2*  --   --   --   --   --   --   HGB 11.8* 11.9*   < > 10.1* 9.9* 10.0* 9.0* 9.6*  HCT 38.1 37.4   < > 31.8* 31.2* 31.2* 28.8* 30.1*  MCV 81.4 79.9   < > 79.3 79.6 79.0 79.8 79.4  PLT 277 249   < > 208 263 288 273 276   < > = values in this interval not displayed.   Basic Metabolic Panel: Recent Labs  Lab 11/18/17 0731 11/19/17 0034 11/21/17 0437 11/22/17 0439 11/23/17 0555  NA 143 139 141 139 142  K 3.5 3.8 3.8 3.4* 4.2  CL 108 107 109 109 112*  CO2 24 23 24  15* 21*  GLUCOSE 160* 168* 131* 149* 140*  BUN 9 19 36* 33* 28*  CREATININE 0.74 1.61* 2.99* 2.62* 2.37*  CALCIUM 8.9 8.3* 8.4* 7.8* 8.1*   GFR: Estimated Creatinine Clearance: 26.8 mL/min (A) (by C-G formula based on SCr of 2.37 mg/dL (H)). Liver Function Tests: Recent Labs  Lab 11/17/17 1155  AST 42*  ALT 58*  ALKPHOS 70  BILITOT 0.7  PROT 7.2  ALBUMIN 3.2*   No results for input(s): LIPASE, AMYLASE in the last 168 hours. No results for input(s): AMMONIA in the last 168 hours. Coagulation Profile: Recent Labs  Lab 11/17/17 1602 11/17/17 1911  INR 1.17 1.24   Cardiac Enzymes: Recent Labs  Lab 11/19/17 0114 11/19/17 0731 11/19/17 1330  TROPONINI 0.04* <0.03 0.03*   BNP (last 3 results) No results for input(s): PROBNP in the last 8760 hours. HbA1C: No results for input(s): HGBA1C in the last 72 hours. CBG: Recent Labs  Lab 11/22/17 1610 11/22/17 2123 11/23/17 0803 11/23/17 1211 11/23/17 1649  GLUCAP 143* 166* 120* 147* 132*   Lipid Profile: No results for input(s): CHOL, HDL, LDLCALC, TRIG, CHOLHDL, LDLDIRECT in the last 72 hours. Thyroid Function Tests: No results for input(s): TSH, T4TOTAL, FREET4, T3FREE, THYROIDAB in the last 72 hours. Anemia Panel: No results for input(s): VITAMINB12, FOLATE, FERRITIN, TIBC, IRON, RETICCTPCT in the last 72 hours. Urine analysis:    Component Value Date/Time   COLORURINE YELLOW  11/12/2017 2135   APPEARANCEUR CLEAR 11/12/2017 2135   LABSPEC 1.017 11/12/2017 2135   PHURINE 5.0 11/12/2017 2135   GLUCOSEU NEGATIVE 11/12/2017 2135   HGBUR NEGATIVE 11/12/2017 2135   BILIRUBINUR NEGATIVE 11/12/2017 2135   KETONESUR NEGATIVE 11/12/2017 2135   PROTEINUR NEGATIVE 11/12/2017 2135   UROBILINOGEN 1.0 12/28/2011 1634   NITRITE NEGATIVE 11/12/2017 2135   LEUKOCYTESUR TRACE (A) 11/12/2017 2135   Recent Results (from the past 240 hour(s))  Blood Culture (routine x 2)     Status: None   Collection Time: 11/17/17 12:33 PM  Result Value Ref Range Status   Specimen Description BLOOD LEFT ANTECUBITAL  Final   Special Requests   Final    BOTTLES DRAWN AEROBIC AND ANAEROBIC  Blood Culture results may not be optimal due to an inadequate volume of blood received in culture bottles   Culture   Final    NO GROWTH 5 DAYS Performed at [facility], [address], [place], [place] [address]    Report Status 11/22/2017 FINAL  Final  Blood Culture (routine x 2)     Status: None   Collection Time: 11/17/17  1:16 PM  Result Value Ref Range Status   Specimen Description BLOOD RIGHT HAND  Final   Special Requests   Final    BOTTLES DRAWN AEROBIC AND ANAEROBIC Blood Culture adequate volume   Culture   Final    NO GROWTH 5 DAYS Performed at [facility], [address] [place], [place] [address]    Report Status 11/22/2017 FINAL  Final  Culture, blood (x 2)     Status: None   Collection Time: 11/17/17  4:16 PM  Result Value Ref Range Status   Specimen Description BLOOD LEFT ANTECUBITAL  Final   Special Requests   Final    BOTTLES DRAWN AEROBIC AND ANAEROBIC Blood Culture results may not be optimal due to an inadequate volume of blood received in culture bottles   Culture   Final    NO GROWTH 5 DAYS Performed at [facility], [address], [place], [place] [address]    Report Status 11/22/2017 FINAL  Final  Culture, blood (x 2)     Status: None   Collection  Time: 11/17/17  4:21 PM  Result Value Ref Range Status   Specimen Description BLOOD RIGHT HAND  Final   Special Requests   Final    BOTTLES DRAWN AEROBIC AND ANAEROBIC Blood Culture adequate volume   Culture   Final    NO GROWTH 5 DAYS Performed at [facility], [address] [place], [place] [address]    Report Status 11/22/2017 FINAL  Final      Radiology Studies: No results found.  Scheduled Meds: . aspirin  81 mg Oral Daily  . atorvastatin  20 mg Oral q1800  . cholecalciferol  800 Units Oral Daily  . cloNIDine  0.1 mg Oral BID  . ferrous sulfate  325 mg Oral Q breakfast  . gabapentin  300 mg Oral Q12H  . hydroxypropyl methylcellulose / hypromellose  1 drop Both Eyes BID  . insulin aspart  0-9 Units Subcutaneous TID WC  . latanoprost  1 drop Both Eyes QHS  . mouth rinse  15 mL Mouth Rinse BID  . metoprolol succinate  75 mg Oral Daily  . polyethylene glycol  17 g Oral Daily   Continuous Infusions: . sodium chloride 50 mL/hr at 11/23/17 1526  . heparin 1,050 Units/hr (11/23/17 1526)     LOS: 6 days   Time spent: 25 minutes.  Tyrone Nine, MD [facility] [email or web address] Password [facility] 11/23/2017, 5:44 PM

## 2017-11-24 LAB — CBC
HCT: 32.4 % — ABNORMAL LOW (ref 36.0–46.0)
HEMOGLOBIN: 10.1 g/dL — AB (ref 12.0–15.0)
MCH: 25.2 pg — ABNORMAL LOW (ref 26.0–34.0)
MCHC: 31.2 g/dL (ref 30.0–36.0)
MCV: 80.8 fL (ref 78.0–100.0)
Platelets: 264 10*3/uL (ref 150–400)
RBC: 4.01 MIL/uL (ref 3.87–5.11)
RDW: 15.9 % — ABNORMAL HIGH (ref 11.5–15.5)
WBC: 13.4 10*3/uL — AB (ref 4.0–10.5)

## 2017-11-24 LAB — BASIC METABOLIC PANEL
ANION GAP: 10 (ref 5–15)
BUN: 24 mg/dL — ABNORMAL HIGH (ref 8–23)
CHLORIDE: 111 mmol/L (ref 98–111)
CO2: 22 mmol/L (ref 22–32)
Calcium: 8.4 mg/dL — ABNORMAL LOW (ref 8.9–10.3)
Creatinine, Ser: 2.19 mg/dL — ABNORMAL HIGH (ref 0.44–1.00)
GFR calc non Af Amer: 23 mL/min — ABNORMAL LOW (ref >60)
GFR, EST AFRICAN AMERICAN: 27 mL/min — AB (ref >60)
GLUCOSE: 135 mg/dL — AB (ref 70–99)
POTASSIUM: 4.1 mmol/L (ref 3.5–5.1)
Sodium: 143 mmol/L (ref 135–145)

## 2017-11-24 LAB — URINALYSIS, ROUTINE W REFLEX MICROSCOPIC
Bilirubin Urine: NEGATIVE
GLUCOSE, UA: NEGATIVE mg/dL
KETONES UR: NEGATIVE mg/dL
NITRITE: POSITIVE — AB
PROTEIN: 30 mg/dL — AB
Specific Gravity, Urine: 1.01 (ref 1.005–1.030)
pH: 7 (ref 5.0–8.0)

## 2017-11-24 LAB — URINALYSIS, MICROSCOPIC (REFLEX)

## 2017-11-24 LAB — GLUCOSE, CAPILLARY
GLUCOSE-CAPILLARY: 192 mg/dL — AB (ref 70–99)
GLUCOSE-CAPILLARY: 112 mg/dL — AB (ref 70–99)
Glucose-Capillary: 128 mg/dL — ABNORMAL HIGH (ref 70–99)
Glucose-Capillary: 209 mg/dL — ABNORMAL HIGH (ref 70–99)

## 2017-11-24 LAB — APTT: aPTT: 101 seconds — ABNORMAL HIGH (ref 24–36)

## 2017-11-24 LAB — HEPARIN LEVEL (UNFRACTIONATED): HEPARIN UNFRACTIONATED: 0.52 [IU]/mL (ref 0.30–0.70)

## 2017-11-24 MED ORDER — METOPROLOL SUCCINATE ER 100 MG PO TB24
100.0000 mg | ORAL_TABLET | Freq: Every day | ORAL | Status: DC
  Administered 2017-11-24 – 2017-11-25 (×2): 100 mg via ORAL
  Filled 2017-11-24 (×2): qty 1

## 2017-11-24 MED ORDER — CLONIDINE HCL 0.2 MG PO TABS
0.2000 mg | ORAL_TABLET | Freq: Three times a day (TID) | ORAL | Status: DC
  Administered 2017-11-24 – 2017-11-25 (×3): 0.2 mg via ORAL
  Filled 2017-11-24 (×3): qty 1

## 2017-11-24 MED ORDER — CLONIDINE HCL 0.1 MG PO TABS
0.1000 mg | ORAL_TABLET | Freq: Once | ORAL | Status: AC
  Administered 2017-11-24: 0.1 mg via ORAL
  Filled 2017-11-24: qty 1

## 2017-11-24 NOTE — Progress Notes (Signed)
PROGRESS NOTE  [name]  ZOX:[id number] DOB: [birth year] DOA: 11/17/2017 PCP: Florentina Jenny, MD  Brief Narrative: [name] is a 62 y.o. female with a history of MS, bedbound, T2DM, HTN, HLD, and recent hospitalization for DVT and PE discharged 7/2 to ALF on eliquis who returned to the ED 7/4 with fever noted to have increased clot burden on repeat CTA chest. No source of infection has been found. She was put on xarelto initially, transitioned to heparin infusion with worsening AKI due to suspected contrast nephropathy.   Assessment & Plan: Active Problems:   MS (multiple sclerosis) (HCC)   Fever   DM2 (diabetes mellitus, type 2) (HCC)   Encephalopathy, metabolic   Depression   Pyelonephritis, acute   Pulmonary embolism (HCC)   DVT (deep venous thrombosis) Rt Leg   Leukocytosis   Sepsis (HCC)  SIRS: Suspected to be due to VTE without infectious source identified on urinalysis, CT chest, abdomen, pelvis, or on exam.  - Continue to monitor off antibiotics (stopped vancomycin 7/6, stopped zosyn 7/8.  - Monitor blood cultures (remain negative)  PE: Progressive clot burden despite reported adherence to eliquis, considered a failed trial.  - Initially on heparin gtt x72hrs, then transitioned to xarelto, back to heparin 7/7 with worsening renal function. - Plan to restart xarelto once CrCl back >78ml/min with assumption that renal function will return to/near normal with measures below.  AKI: Cr 0.74 on admission, up to 2.99. Suspect contrast-induced nephropathy, also possibly related to vancomycin and zosyn which have been stopped - Limit nephrotoxins. Holding lisinopril.  - Continue IVF's as able with trace LE edema. Continue 100cc/hr. G1DD, EF 65-70%.   HTN: Elevated.  - Continue increased metoprolol. - Continue clonidine, increased BID > TID, now increasing 0.1mg  > 0.2mg  (give additional 0.1mg  dose now)  - Hold lisinopril 20mg  as above - Hydralazine IV prn  T2DM:  - Hold  metformin - Continue SSI (at inpatient goal)  Sinus tachycardia: Improved, due to PE.  - In addition to increased BP, will increase beta blocker modestly.   Hyperlipidemia:  - Continue statin  Anemia of chronic disease and iron deficiency anemia:  - Monitor - Continue po iron  MS:  - Follow up with neurology as outpatient for management including IFN  - DC to SNF  DVT prophylaxis: Heparin gtt Code Status: DNR Family Communication: None at bedside Disposition Plan: SNF  Consultants:   Pharmacy  Procedures:   None  Antimicrobials:  Vancomycin 7/4 - 6/5  Zosyn 7/4 - 7/8  Subjective: Continues to have no complaints. Denies any pain, specifically chest pain. No shortness of breath or leg swelling. UOP normal.   Objective: Vitals:   11/23/17 2208 11/23/17 2349 11/24/17 0440 11/24/17 0843  BP: (!) 175/112 (!) 181/95  (!) 170/127  Pulse:  (!) 104  (!) 103  Resp:  13    Temp:   98.3 F (36.8 C)   TempSrc:      SpO2:  100%    Weight:   79.8 kg (175 lb 14.8 oz)   Height:        Intake/Output Summary (Last 24 hours) at 11/24/2017 1043 Last data filed at 11/24/2017 0243 Gross per 24 hour  Intake 1391.12 ml  Output 1950 ml  Net -558.88 ml   Filed Weights   11/21/17 0500 11/23/17 0406 11/24/17 0440  Weight: 75.9 kg (167 lb 5.3 oz) 80.4 kg (177 lb 4 oz) 79.8 kg (175 lb 14.8 oz)   Gen: 62  y.o. female in no distress Pulm: Nonlabored breathing supplemental oxygen. Clear. CV: Regular tachycardia. No murmur, rub, or gallop. No JVD, no dependent edema. GI: Abdomen soft, non-tender, non-distended, with normoactive bowel sounds.  Ext: Warm, no deformities Skin: No rashes, lesions or ulcers on visualized skin.  Neuro: Alert and oriented to person and conversant without aphasia. Stable neuro deficits.  Psych: Judgement and insight appear impaired. Mood euthymic & affect congruent. Behavior is appropriate.    Data Reviewed: I have personally reviewed following labs and  imaging studies  CBC: Recent Labs  Lab 11/17/17 1155 11/17/17 1911  11/20/17 0534 11/21/17 0437 11/22/17 0439 11/23/17 0555 11/24/17 0633  WBC 22.1* 21.8*   < > 15.5* 10.6* 10.9* 13.3* 13.4*  NEUTROABS 17.4* 17.2*  --   --   --   --   --   --   HGB 11.8* 11.9*   < > 9.9* 10.0* 9.0* 9.6* 10.1*  HCT 38.1 37.4   < > 31.2* 31.2* 28.8* 30.1* 32.4*  MCV 81.4 79.9   < > 79.6 79.0 79.8 79.4 80.8  PLT 277 249   < > 263 288 273 276 264   < > = values in this interval not displayed.   Basic Metabolic Panel: Recent Labs  Lab 11/19/17 0034 11/21/17 0437 11/22/17 0439 11/23/17 0555 11/24/17 0633  NA 139 141 139 142 143  K 3.8 3.8 3.4* 4.2 4.1  CL 107 109 109 112* 111  CO2 23 24 15* 21* 22  GLUCOSE 168* 131* 149* 140* 135*  BUN 19 36* 33* 28* 24*  CREATININE 1.61* 2.99* 2.62* 2.37* 2.19*  CALCIUM 8.3* 8.4* 7.8* 8.1* 8.4*   GFR: Estimated Creatinine Clearance: 29 mL/min (A) (by C-G formula based on SCr of 2.19 mg/dL (H)). Liver Function Tests: Recent Labs  Lab 11/17/17 1155  AST 42*  ALT 58*  ALKPHOS 70  BILITOT 0.7  PROT 7.2  ALBUMIN 3.2*   No results for input(s): LIPASE, AMYLASE in the last 168 hours. No results for input(s): AMMONIA in the last 168 hours. Coagulation Profile: Recent Labs  Lab 11/17/17 1602 11/17/17 1911  INR 1.17 1.24   Cardiac Enzymes: Recent Labs  Lab 11/19/17 0114 11/19/17 0731 11/19/17 1330  TROPONINI 0.04* <0.03 0.03*   BNP (last 3 results) No results for input(s): PROBNP in the last 8760 hours. HbA1C: No results for input(s): HGBA1C in the last 72 hours. CBG: Recent Labs  Lab 11/23/17 0803 11/23/17 1211 11/23/17 1649 11/23/17 2200 11/24/17 0728  GLUCAP 120* 147* 132* 180* 128*   Lipid Profile: No results for input(s): CHOL, HDL, LDLCALC, TRIG, CHOLHDL, LDLDIRECT in the last 72 hours. Thyroid Function Tests: No results for input(s): TSH, T4TOTAL, FREET4, T3FREE, THYROIDAB in the last 72 hours. Anemia Panel: No results for  input(s): VITAMINB12, FOLATE, FERRITIN, TIBC, IRON, RETICCTPCT in the last 72 hours. Urine analysis:    Component Value Date/Time   COLORURINE YELLOW 11/12/2017 2135   APPEARANCEUR CLEAR 11/12/2017 2135   LABSPEC 1.017 11/12/2017 2135   PHURINE 5.0 11/12/2017 2135   GLUCOSEU NEGATIVE 11/12/2017 2135   HGBUR NEGATIVE 11/12/2017 2135   BILIRUBINUR NEGATIVE 11/12/2017 2135   KETONESUR NEGATIVE 11/12/2017 2135   PROTEINUR NEGATIVE 11/12/2017 2135   UROBILINOGEN 1.0 12/28/2011 1634   NITRITE NEGATIVE 11/12/2017 2135   LEUKOCYTESUR TRACE (A) 11/12/2017 2135   Recent Results (from the past 240 hour(s))  Blood Culture (routine x 2)     Status: None   Collection Time: 11/17/17 12:33 PM  Result Value Ref Range Status   Specimen Description BLOOD LEFT ANTECUBITAL  Final   Special Requests   Final    BOTTLES DRAWN AEROBIC AND ANAEROBIC Blood Culture results may not be optimal due to an inadequate volume of blood received in culture bottles   Culture   Final    NO GROWTH 5 DAYS Performed at [facility], [address], [place], [place] [address]    Report Status 11/22/2017 FINAL  Final  Blood Culture (routine x 2)     Status: None   Collection Time: 11/17/17  1:16 PM  Result Value Ref Range Status   Specimen Description BLOOD RIGHT HAND  Final   Special Requests   Final    BOTTLES DRAWN AEROBIC AND ANAEROBIC Blood Culture adequate volume   Culture   Final    NO GROWTH 5 DAYS Performed at [facility], [address] [place], [place] [address]    Report Status 11/22/2017 FINAL  Final  Culture, blood (x 2)     Status: None   Collection Time: 11/17/17  4:16 PM  Result Value Ref Range Status   Specimen Description BLOOD LEFT ANTECUBITAL  Final   Special Requests   Final    BOTTLES DRAWN AEROBIC AND ANAEROBIC Blood Culture results may not be optimal due to an inadequate volume of blood received in culture bottles   Culture   Final    NO GROWTH 5 DAYS Performed at [facility], [address], [place], [place] [address]    Report Status 11/22/2017 FINAL  Final  Culture, blood (x 2)     Status: None   Collection Time: 11/17/17  4:21 PM  Result Value Ref Range Status   Specimen Description BLOOD RIGHT HAND  Final   Special Requests   Final    BOTTLES DRAWN AEROBIC AND ANAEROBIC Blood Culture adequate volume   Culture   Final    NO GROWTH 5 DAYS Performed at [facility], [address], [place], [place] [address]    Report Status 11/22/2017 FINAL  Final      Radiology Studies: No results found.  Scheduled Meds: . aspirin  81 mg Oral Daily  . atorvastatin  20 mg Oral q1800  . cholecalciferol  800 Units Oral Daily  . cloNIDine  0.1 mg Oral TID  . ferrous sulfate  325 mg Oral Q breakfast  . gabapentin  300 mg Oral Q12H  . hydroxypropyl methylcellulose / hypromellose  1 drop Both Eyes BID  . insulin aspart  0-9 Units Subcutaneous TID WC  . latanoprost  1 drop Both Eyes QHS  . mouth rinse  15 mL Mouth Rinse BID  . metoprolol succinate  100 mg Oral Daily  . polyethylene glycol  17 g Oral Daily   Continuous Infusions: . sodium chloride 100 mL/hr at 11/24/17 0310  . heparin 1,050 Units/hr (11/24/17 0851)     LOS: 7 days   Time spent: 25 minutes.  Tyrone Nine, MD [facility] [email or web address] Password [facility] 11/24/2017, 10:43 AM

## 2017-11-24 NOTE — Progress Notes (Signed)
ANTICOAGULATION  CONSULT NOTE - Follow Up Consult  Pharmacy Consult for Heparin (Xarelto on hold) Indication: pulmonary embolus and DVT  No Known Allergies  Patient Measurements: Height: 5\' 7"  (170.2 cm) Weight: 175 lb 14.8 oz (79.8 kg) IBW/kg (Calculated) : 61.6 Heparin Dosing Weight: 76 kg  Vital Signs: Temp: 98.3 F (36.8 C) (07/11 0440) BP: 170/127 (07/11 0843) Pulse Rate: 103 (07/11 0843)  Labs: Recent Labs    11/22/17 0439 11/22/17 1318 11/23/17 0555 11/24/17 0633 11/24/17 0637  HGB 9.0*  --  9.6* 10.1*  --   HCT 28.8*  --  30.1* 32.4*  --   PLT 273  --  276 264  --   APTT 53* 106* 96*  --  101*  HEPARINUNFRC 0.45 0.80* 0.49  --  0.52  CREATININE 2.62*  --  2.37* 2.19*  --     Estimated Creatinine Clearance: 29 mL/min (A) (by C-G formula based on SCr of 2.19 mg/dL (H)).   Assessment:  62 y/o F who was on heparin from 6/30>>7/2, transitioned to Apixaban and discharged but did not get any apixaban upon DC. She was started back on heparin on admit 7/4, then transitioned to Xarelto on 7/7. Transitioned back to heparin on 7/8 with AKI. Last Xarelto dose 7/7 at 7pm (had 2 doses on 7/7). Using aPTT to monitor heparin for now given Xarelto influence on heparin levels.    Heparin level is  0.52 , aPTT 101, on Heparin 1050 units/hr. HL and aPTT are both therapeutic. Effect of  prior Xarelto dose on heparin level appears to have diminished as HL is  correlating w/PTT. I  will dc daily PTT and only use heparin level to monitor heparin now.  No bleeding noted.   H/H low/stable and pltc wnl.   AKI 2/2 contrast induced nephropathy: Scr is improving, MD expects SCr to continue to improve and will transition back to Xarelto when CrCL =/>30 ml/min.  CrCl is 29 ml/min on 7/11.   Goal of Therapy:  Heparin level 0.3-0.7 units/ml aPTT 66-102 seconds Monitor platelets by anticoagulation protocol: Yes    Plan:  Continue IV heparin drip 1050 units/hr. Daily heparin level and CBC in  am.  Follow up transition back to Xarelto when renal function allows/ crcl >/= 30 ml/min.  Noah Delaine, RPh Clinical Pharmacist Please check AMION for all [facility] phone numbers After 10:00 PM, call Main Pharmacy [phone] 11/24/2017,11:14 AM

## 2017-11-25 DIAGNOSIS — D72829 Elevated white blood cell count, unspecified: Secondary | ICD-10-CM

## 2017-11-25 LAB — BASIC METABOLIC PANEL
Anion gap: 8 (ref 5–15)
BUN: 21 mg/dL (ref 8–23)
CHLORIDE: 113 mmol/L — AB (ref 98–111)
CO2: 21 mmol/L — AB (ref 22–32)
Calcium: 8.5 mg/dL — ABNORMAL LOW (ref 8.9–10.3)
Creatinine, Ser: 1.9 mg/dL — ABNORMAL HIGH (ref 0.44–1.00)
GFR calc non Af Amer: 27 mL/min — ABNORMAL LOW (ref >60)
GFR, EST AFRICAN AMERICAN: 32 mL/min — AB (ref >60)
Glucose, Bld: 141 mg/dL — ABNORMAL HIGH (ref 70–99)
POTASSIUM: 3.7 mmol/L (ref 3.5–5.1)
Sodium: 142 mmol/L (ref 135–145)

## 2017-11-25 LAB — CBC
HCT: 30.7 % — ABNORMAL LOW (ref 36.0–46.0)
Hemoglobin: 9.8 g/dL — ABNORMAL LOW (ref 12.0–15.0)
MCH: 25.4 pg — ABNORMAL LOW (ref 26.0–34.0)
MCHC: 31.9 g/dL (ref 30.0–36.0)
MCV: 79.5 fL (ref 78.0–100.0)
Platelets: 231 10*3/uL (ref 150–400)
RBC: 3.86 MIL/uL — ABNORMAL LOW (ref 3.87–5.11)
RDW: 15.7 % — ABNORMAL HIGH (ref 11.5–15.5)
WBC: 13.8 10*3/uL — AB (ref 4.0–10.5)

## 2017-11-25 LAB — GLUCOSE, CAPILLARY
GLUCOSE-CAPILLARY: 107 mg/dL — AB (ref 70–99)
Glucose-Capillary: 118 mg/dL — ABNORMAL HIGH (ref 70–99)

## 2017-11-25 LAB — HEPARIN LEVEL (UNFRACTIONATED): Heparin Unfractionated: 0.48 IU/mL (ref 0.30–0.70)

## 2017-11-25 MED ORDER — CLONIDINE HCL 0.2 MG PO TABS: 0.2000 mg | ORAL_TABLET | Freq: Three times a day (TID) | ORAL | Status: DC

## 2017-11-25 MED ORDER — CLONIDINE HCL 0.1 MG PO TABS
0.1000 mg | ORAL_TABLET | Freq: Once | ORAL | Status: AC
  Administered 2017-11-25: 0.1 mg via ORAL
  Filled 2017-11-25: qty 1

## 2017-11-25 MED ORDER — METOPROLOL SUCCINATE ER 100 MG PO TB24: 100.0000 mg | ORAL_TABLET | Freq: Every day | ORAL | Status: DC

## 2017-11-25 MED ORDER — RIVAROXABAN 15 MG PO TABS: 15.0000 mg | ORAL_TABLET | Freq: Two times a day (BID) | ORAL | Status: DC

## 2017-11-25 MED ORDER — RIVAROXABAN 20 MG PO TABS: 20.0000 mg | ORAL_TABLET | Freq: Every day | ORAL | Status: AC

## 2017-11-25 MED ORDER — RIVAROXABAN 15 MG PO TABS
15.0000 mg | ORAL_TABLET | Freq: Two times a day (BID) | ORAL | Status: AC
  Administered 2017-11-25: 15 mg via ORAL
  Filled 2017-11-25: qty 1

## 2017-11-25 MED ORDER — HYDROCODONE-ACETAMINOPHEN 5-325 MG PO TABS: 1.0000 | ORAL_TABLET | Freq: Four times a day (QID) | ORAL | 0 refills | Status: DC | PRN

## 2017-11-25 MED ORDER — HYDRALAZINE HCL 50 MG PO TABS
50.0000 mg | ORAL_TABLET | Freq: Four times a day (QID) | ORAL | Status: DC
  Administered 2017-11-25 (×2): 50 mg via ORAL
  Filled 2017-11-25 (×2): qty 1

## 2017-11-25 MED ORDER — HYDRALAZINE HCL 25 MG PO TABS: 25.0000 mg | ORAL_TABLET | Freq: Three times a day (TID) | ORAL | Status: DC

## 2017-11-25 MED ORDER — HYDRALAZINE HCL 20 MG/ML IJ SOLN
10.0000 mg | Freq: Once | INTRAMUSCULAR | Status: AC
  Administered 2017-11-25: 10 mg via INTRAVENOUS
  Filled 2017-11-25: qty 1

## 2017-11-25 MED ORDER — RIVAROXABAN 20 MG PO TABS: 20.0000 mg | ORAL_TABLET | Freq: Every day | ORAL | Status: DC

## 2017-11-25 NOTE — Discharge Summary (Addendum)
Physician Discharge Summary  [name] WJX:[id number] DOB: February 03, 1956 DOA: 11/17/2017  PCP: Florentina Jenny, MD  Admit date: 11/17/2017 Discharge date: 11/25/2017  Admitted From: ALF Disposition: SNF   Recommendations for Outpatient Follow-up:  1. Follow up with PCP in 1-2 weeks. 2. Continue management of HTN. 3. Repeat CBC and BMP in 1 week. 4. Follow up with urology for further work up of hematuria.  Home Health: N/A Equipment/Devices: Per SNF Discharge Condition: Stable CODE STATUS: DNR Diet recommendation: Carb-modified  Brief/Interim Summary: [name] is a 62 y.o. female with a history of MS, bedbound, T2DM, HTN, HLD, and recent hospitalization for DVT and PE discharged 7/2 to ALF on eliquis who returned to the ED 7/4 with fever noted to have increased clot burden on repeat CTA chest. No source of infection has been found and she has remained afebrile without antibiotics. She was put on xarelto initially, transitioned to heparin infusion with worsening AKI due to suspected contrast nephropathy. Creatinine slowly improved with time and hydration. Creatinine clearance has steadily improved, now 46ml/min so xarelto is restarted and will continue at discharge. She has developed hematuria which is decreasing and hemoglobin is stable, platelets normal, and imaging was normal. Urology was consulted and recommended outpatient follow up for nonurgent cystoscopy as long as anemia was not worsening.  Discharge Diagnoses:  Active Problems:   MS (multiple sclerosis) (HCC)   Fever   DM2 (diabetes mellitus, type 2) (HCC)   Encephalopathy, metabolic   Depression   Pyelonephritis, acute   Pulmonary embolism (HCC)   DVT (deep venous thrombosis) Rt Leg   Leukocytosis   Sepsis (HCC)  SIRS: Suspected to be due to VTE without infectious source identified on urinalysis, CT chest, abdomen, pelvis, or on exam.  - Continue to monitor off antibiotics (stopped vancomycin 7/6, stopped zosyn 7/8).  Remains afebrile with negative blood cultures.   PE: CTA chest (full read below) shows some areas of improvement but also some new clots despite reported adherence to eliquis. Fortunately no dyspnea (pt is bedbound) - Initially on heparin gtt x72hrs, then transitioned to xarelto, back to heparin 7/7 with worsening renal function. - Xarelto 15 mg BID with meals for 21 days then on day #22 (12/16/17) reduce Xarelto dose to 20mg  ONCE daily with supper now that CrCl back >36ml/min.   AKI: Cr 0.74 on admission, up to 2.99. Suspect contrast-induced nephropathy, also possibly related to vancomycin and zosyn which have been stopped - Limit nephrotoxins. Holding lisinopril, metformin.  - Follow up BMP and avoid hypotension  Hematuria: Improving, no drop in hgb. No abnormality in CT scans recently.  - Urology consulted, will need follow up in office for cystoscopy.   HTN: Elevated.  - Continue increased metoprolol. - Continue clonidine, increased BID > TID and 0.1mg  > 0.2mg   - Also started hydralazine 25mg  TID, continue titrating as needed. - Hold lisinopril 20mg  as above  T2DM:  - Hold metformin, has been at inpatient goal with almost no insulin required.  Sinus tachycardia: Improved, due to PE.  - On beta blocker  Hyperlipidemia:  - Continue statin  Anemia of chronic disease and iron deficiency anemia:  - Monitor CBC with some hematuria as above.  - Continue po iron  MS:  - Follow up with neurology as outpatient for management. Apparently no longer on interferon (will DC this per [facility] pending neuro follow up) - DC to SNF   Discharge Instructions Discharge Instructions    Diet Carb Modified   Complete by:  As directed    Increase activity slowly   Complete by:  As directed      Allergies as of 11/25/2017   No Known Allergies     Medication List    STOP taking these medications   AVONEX PEN 30 MCG/0.5ML injection Generic drug:  interferon beta-1a Inject 30 mcg into the  muscle every 7 (seven) days.  aspirin 81 MG chewable tablet   diphenhydrAMINE 25 MG tablet Commonly known as:  BENADRYL   ELIQUIS STARTER PACK 5 MG Tabs   lisinopril 20 MG tablet Commonly known as:  PRINIVIL,ZESTRIL   metFORMIN 500 MG tablet Commonly known as:  GLUCOPHAGE   ondansetron 4 MG tablet Commonly known as:  ZOFRAN   polyethylene glycol packet Commonly known as:  MIRALAX / GLYCOLAX   psyllium 0.52 g capsule Commonly known as:  REGULOID     TAKE these medications   atorvastatin 20 MG tablet Commonly known as:  LIPITOR Take 1 tablet (20 mg total) by mouth daily.   barrier cream Crea Commonly known as:  non-specified Apply 1 application topically See admin instructions. Apply 1 application to buttocks after every changing   cholecalciferol 400 units Tabs tablet Commonly known as:  VITAMIN D Take 800 Units by mouth daily.   cloNIDine 0.2 MG tablet Commonly known as:  CATAPRES Take 1 tablet (0.2 mg total) by mouth 3 (three) times daily. What changed:    medication strength  how much to take  when to take this   Cranberry 500 MG Caps Take 500 mg by mouth 2 (two) times daily.   ferrous sulfate 325 (65 FE) MG tablet Take 1 tablet (325 mg total) by mouth daily with breakfast.   gabapentin 300 MG capsule Commonly known as:  NEURONTIN Take 1 capsule (300 mg total) by mouth every 12 (twelve) hours.   hydrALAZINE 25 MG tablet Commonly known as:  APRESOLINE Take 1 tablet (25 mg total) by mouth 3 (three) times daily.   HYDROcodone-acetaminophen 5-325 MG tablet Commonly known as:  NORCO/VICODIN Take 1-2 tablets by mouth every 6 (six) hours as needed for moderate pain.   metoprolol succinate 100 MG 24 hr tablet Commonly known as:  TOPROL-XL Take 1 tablet (100 mg total) by mouth daily. Take with or immediately following a meal. What changed:    medication strength  how much to take  Another medication with the same name was removed. Continue taking  this medication, and follow the directions you see here.   Rivaroxaban 15 MG Tabs tablet Commonly known as:  XARELTO Take 1 tablet (15 mg total) by mouth 2 (two) times daily with a meal.   rivaroxaban 20 MG Tabs tablet Commonly known as:  XARELTO Take 1 tablet (20 mg total) by mouth daily with supper. Start taking on:  12/16/2017   senna-docusate 8.6-50 MG tablet Commonly known as:  Senokot-S Take 2 tablets by mouth at bedtime.   SYSTANE BALANCE 0.6 % Soln Generic drug:  Propylene Glycol Place 1 drop into both eyes 2 (two) times daily.   Travoprost (BAK Free) 0.004 % Soln ophthalmic solution Commonly known as:  TRAVATAN Place 1 drop into both eyes at bedtime.   zinc oxide 20 % ointment Apply 1 application topically See admin instructions. Apply 1 application after every episode of urinary incontinence/wash first       Contact information for follow-up providers    Florentina Jenny, MD Follow up.   Specialty:  Family Medicine Contact information: [address]  [place] [address] [phone]        Sebastian Ache, MD Follow up.   Specialty:  Urology Contact information: [address] [place] [address] [phone]            Contact information for after-discharge care    Destination    [facility] .   Service:  Skilled Nursing Contact information: [address] [place] [address] [phone]                 No Known Allergies  Consultations:  Urology  Procedures/Studies: Ct Abdomen Pelvis Wo Contrast  Result Date: 11/17/2017 CLINICAL DATA:  62 year old female with acute abdominal pain and diarrhea. EXAM: CT ABDOMEN AND PELVIS WITHOUT CONTRAST TECHNIQUE: Multidetector CT imaging of the abdomen and pelvis was performed following the standard protocol without IV contrast. COMPARISON:  11/12/2017 CT FINDINGS: Please note that parenchymal abnormalities may be missed without intravenous contrast. Lower  chest: Bibasilar atelectasis again noted. Elevated LEFT hemidiaphragm again noted. Hepatobiliary: The liver and gallbladder are unremarkable. No definite biliary dilatation. Pancreas: Unremarkable Spleen: Unremarkable Adrenals/Urinary Tract: The kidneys, adrenal glands and bladder are unremarkable. Stomach/Bowel: Stomach is within normal limits. No evidence of bowel wall thickening, distention, or inflammatory changes. Vascular/Lymphatic: Aortic atherosclerosis. No enlarged abdominal or pelvic lymph nodes. Reproductive: Status post hysterectomy. No adnexal masses. Other: No ascites, focal collection or pneumoperitoneum. Musculoskeletal: No acute or suspicious bony abnormality. Apex LEFT lumbar scoliosis and moderate degenerative disc disease at L3-4 again identified. IMPRESSION: 1. No evidence of acute abnormality within the abdomen or pelvis. 2. Bibasilar atelectasis again noted 3.  Aortic Atherosclerosis (ICD10-I70.0). Electronically Signed   By: Harmon Pier M.D.   On: 11/17/2017 15:39   Dg Chest 2 View  Result Date: 11/12/2017 CLINICAL DATA:  Arm pain. EXAM: CHEST - 2 VIEW COMPARISON:  Chest x-ray dated December 28, 2011. FINDINGS: The heart size and mediastinal contours are within normal limits. Normal pulmonary vascularity. No focal consolidation, pleural effusion, or pneumothorax. Unchanged elevation of the left hemidiaphragm. No acute osseous abnormality. IMPRESSION: No active cardiopulmonary disease. Electronically Signed   By: Obie Dredge M.D.   On: 11/12/2017 19:56   Ct Angio Chest Pe W Or Wo Contrast  Result Date: 11/17/2017 CLINICAL DATA:  Tachycardia.  Recent PE/DVT. EXAM: CT ANGIOGRAPHY CHEST WITH CONTRAST TECHNIQUE: Multidetector CT imaging of the chest was performed using the standard protocol during bolus administration of intravenous contrast. Multiplanar CT image reconstructions and MIPs were obtained to evaluate the vascular anatomy. CONTRAST:  ISOVUE-370 IOPAMIDOL (ISOVUE-370)  INJECTION 76% COMPARISON:  11/12/2016 FINDINGS: Cardiovascular: Heart is normal in size. Calcified plaque over the left anterior descending and right coronary arteries. Mild calcified plaque over the thoracic aorta. Pulmonary arterial system is well opacified demonstrates no significant change in moderate emboli over the right lower lobar pulmonary arteries with slight improvement in the proximal right upper lobar arteries and emboli over the right middle lobar pulmonary artery. New pulmonary emboli over the left upper lobe and proximal left lower lobar pulmonary arteries. RV/LV ratio is 33.1/35.4 is 0.93. Mediastinum/Nodes: No mediastinal or hilar adenopathy. Remaining mediastinal structures are within normal. Lungs/Pleura: Elevated left hemidiaphragm with slight worsening opacification left base likely atelectasis. Right lung is clear. Airways are normal. Upper Abdomen: No acute findings. Calcified plaque over the abdominal aorta. Musculoskeletal: Unchanged. Review of the MIP images confirms the above findings. IMPRESSION: Known right-sided pulmonary emboli without significant overall change and new pulmonary emboli over the proximal right upper  and lower lobar pulmonary arteries. Positive for acute PE with CT evidence of right heart strain (RV/LV Ratio = 0.93) consistent with at least submassive (intermediate risk) PE. The presence of right heart strain has been associated with an increased risk of morbidity and mortality. Please activate Code PE by paging [phone]. Slight worsening opacification left base likely atelectasis. Atherosclerotic coronary artery disease. Aortic Atherosclerosis (ICD10-I70.0). These results will be called to the ordering clinician or representative by the Radiologist Assistant, and communication documented in the PACS or zVision Dashboard. Electronically Signed   By: Elberta Fortis M.D.   On: 11/17/2017 22:15   Ct Angio Chest Pe W/cm &/or Wo Cm  Result Date: 11/12/2017 CLINICAL  DATA:  Right cheek and arm cramping. Chest pain and tachycardia. EXAM: CT ANGIOGRAPHY CHEST CT ABDOMEN AND PELVIS WITH CONTRAST TECHNIQUE: Multidetector CT imaging of the chest was performed using the standard protocol during bolus administration of intravenous contrast. Multiplanar CT image reconstructions and MIPs were obtained to evaluate the vascular anatomy. Multidetector CT imaging of the abdomen and pelvis was performed using the standard protocol during bolus administration of intravenous contrast. CONTRAST:  ISOVUE-300 IOPAMIDOL (ISOVUE-300) INJECTION 61% COMPARISON:  CXR 11/12/2017 FINDINGS: CTA CHEST FINDINGS Cardiovascular: Acute pulmonary emboli at the branch point of the right main pulmonary embolus extending into the right lobar level in the upper lobe and segmental level to the right lower lobe. RV/LV ratio elevated at 0.79. Left ventricular hypertrophy is seen. No pericardial effusion. No aortic aneurysm or dissection. Mild ectasia of the ascending aorta to 3.7 cm. Common branch point of the right brachiocephalic and left common carotid arteries without stenosis of the great vessels. Coronary arteriosclerosis is noted along the LAD. Mediastinum/Nodes: No mediastinal nor hilar adenopathy. Trachea is unremarkable. Mainstem bronchi are patent. Lungs/Pleura: Dependent atelectasis. No acute pulmonary abnormality. No effusion or pneumothorax. No dominant mass. Elevated left hemidiaphragm. Musculoskeletal: No chest wall abnormality. No acute or significant osseous findings. Review of the MIP images confirms the above findings. CT ABDOMEN and PELVIS FINDINGS Hepatobiliary: No focal liver abnormality is seen. No gallstones, gallbladder wall thickening, or biliary dilatation. Pancreas: Unremarkable. No pancreatic ductal dilatation or surrounding inflammatory changes. Spleen: Normal size spleen without mass. Punctate calcification consistent with a tiny granuloma is noted within. Adrenals/Urinary Tract:  Normal bilateral adrenal glands. Symmetric cortical enhancement of kidneys without enhancing mass lesions. No nephrolithiasis nor hydroureteronephrosis. The urinary bladder is unremarkable for the degree of distention. Stomach/Bowel: Small hiatal hernia. Decompressed stomach with normal small bowel rotation. No bowel obstruction or inflammation. The terminal and distal ileum are normal. The appendix is not well visualized but no pericecal inflammatory change is seen. Moderate stool retention is noted within colon. Vascular/Lymphatic: Moderate aortoiliac atherosclerosis without aneurysm. No adenopathy. Reproductive: Hysterectomy.  No adnexal mass. Other: Tiny fat containing umbilical hernia. No free air nor free fluid. Musculoskeletal: Degenerative disc disease L3-4. No acute osseous abnormality. Review of the MIP images confirms the above findings. IMPRESSION: Chest CT: 1. Positive for acute PE to the right upper and lower lobes with CT evidence of right heart strain (RV/LV Ratio = 0.78) consistent with at least submassive (intermediate risk) PE. The presence of right heart strain has been associated with an increased risk of morbidity and mortality. Please activate Code PE by paging [phone]. These results were called by telephone at the time of interpretation on 11/12/2017 at 11:43 pm to PA [facility] , who verbally acknowledged these results. 2. No active pulmonary disease. 3. Coronary arteriosclerosis along the  LAD. 4. Mild aortic atherosclerosis without aneurysm or dissection. CT AP: 1. No acute solid nor hollow visceral organ pathology. 2. Degenerative disc disease L3-4. Electronically Signed   By: Tollie Eth M.D.   On: 11/12/2017 23:44   Ct Abdomen Pelvis W Contrast  Result Date: 11/12/2017 CLINICAL DATA:  Right cheek and arm cramping. Chest pain and tachycardia. EXAM: CT ANGIOGRAPHY CHEST CT ABDOMEN AND PELVIS WITH CONTRAST TECHNIQUE: Multidetector CT imaging of the chest was performed using the  standard protocol during bolus administration of intravenous contrast. Multiplanar CT image reconstructions and MIPs were obtained to evaluate the vascular anatomy. Multidetector CT imaging of the abdomen and pelvis was performed using the standard protocol during bolus administration of intravenous contrast. CONTRAST:  ISOVUE-300 IOPAMIDOL (ISOVUE-300) INJECTION 61% COMPARISON:  CXR 11/12/2017 FINDINGS: CTA CHEST FINDINGS Cardiovascular: Acute pulmonary emboli at the branch point of the right main pulmonary embolus extending into the right lobar level in the upper lobe and segmental level to the right lower lobe. RV/LV ratio elevated at 0.79. Left ventricular hypertrophy is seen. No pericardial effusion. No aortic aneurysm or dissection. Mild ectasia of the ascending aorta to 3.7 cm. Common branch point of the right brachiocephalic and left common carotid arteries without stenosis of the great vessels. Coronary arteriosclerosis is noted along the LAD. Mediastinum/Nodes: No mediastinal nor hilar adenopathy. Trachea is unremarkable. Mainstem bronchi are patent. Lungs/Pleura: Dependent atelectasis. No acute pulmonary abnormality. No effusion or pneumothorax. No dominant mass. Elevated left hemidiaphragm. Musculoskeletal: No chest wall abnormality. No acute or significant osseous findings. Review of the MIP images confirms the above findings. CT ABDOMEN and PELVIS FINDINGS Hepatobiliary: No focal liver abnormality is seen. No gallstones, gallbladder wall thickening, or biliary dilatation. Pancreas: Unremarkable. No pancreatic ductal dilatation or surrounding inflammatory changes. Spleen: Normal size spleen without mass. Punctate calcification consistent with a tiny granuloma is noted within. Adrenals/Urinary Tract: Normal bilateral adrenal glands. Symmetric cortical enhancement of kidneys without enhancing mass lesions. No nephrolithiasis nor hydroureteronephrosis. The urinary bladder is unremarkable for the  degree of distention. Stomach/Bowel: Small hiatal hernia. Decompressed stomach with normal small bowel rotation. No bowel obstruction or inflammation. The terminal and distal ileum are normal. The appendix is not well visualized but no pericecal inflammatory change is seen. Moderate stool retention is noted within colon. Vascular/Lymphatic: Moderate aortoiliac atherosclerosis without aneurysm. No adenopathy. Reproductive: Hysterectomy.  No adnexal mass. Other: Tiny fat containing umbilical hernia. No free air nor free fluid. Musculoskeletal: Degenerative disc disease L3-4. No acute osseous abnormality. Review of the MIP images confirms the above findings. IMPRESSION: Chest CT: 1. Positive for acute PE to the right upper and lower lobes with CT evidence of right heart strain (RV/LV Ratio = 0.78) consistent with at least submassive (intermediate risk) PE. The presence of right heart strain has been associated with an increased risk of morbidity and mortality. Please activate Code PE by paging [phone]. These results were called by telephone at the time of interpretation on 11/12/2017 at 11:43 pm to PA [facility] , who verbally acknowledged these results. 2. No active pulmonary disease. 3. Coronary arteriosclerosis along the LAD. 4. Mild aortic atherosclerosis without aneurysm or dissection. CT AP: 1. No acute solid nor hollow visceral organ pathology. 2. Degenerative disc disease L3-4. Electronically Signed   By: Tollie Eth M.D.   On: 11/12/2017 23:44   Dg Chest Port 1 View  Result Date: 11/17/2017 CLINICAL DATA:  Fever EXAM: PORTABLE CHEST 1 VIEW COMPARISON:  CT chest 11/12/2017.  Chest x-ray 11/12/2017.  FINDINGS: 1255 hours. The cardiopericardial silhouette is within normal limits for size. The visualized bony structures of the thorax are intact. Telemetry leads overlie the chest. Fairly prominent gaseous distention of colon visualized in the upper abdomen. Marked asymmetric elevation left hemidiaphragm with  adjacent atelectasis in the left lung base. IMPRESSION: Asymmetric elevation left hemidiaphragm with adjacent left base atelectasis. Diffuse gaseous distention of colon within the visualized upper abdomen. Electronically Signed   By: Kennith Center M.D.   On: 11/17/2017 13:07    Subjective: Blood pressure improved. Denies headache this PM. No chest pain or dyspnea or other complaints. Red tint to urine is clearing up.   Discharge Exam: Vitals:   11/25/17 1042 11/25/17 1414  BP: (!) 147/93 (!) 161/93  Pulse: 79 93  Resp:  18  Temp:  98.7 F (37.1 C)  SpO2: 100% 100%   Pulm: Nonlabored breathing room air. Clear. CV: Regular rate and rhythm. No murmur, rub, or gallop. No JVD, no dependent edema. GI: Abdomen soft, non-tender, non-distended, with normoactive bowel sounds.  Ext: Warm, no deformities Skin: No rashes, lesions or ulcers on visualized skin.  Neuro: Alert and oriented to person and conversant without aphasia. Stable neuro deficits.  Psych: Judgement and insight appear impaired. Mood euthymic & affect congruent. Behavior is appropriate.      Labs: BNP (last 3 results) No results for input(s): BNP in the last 8760 hours. Basic Metabolic Panel: Recent Labs  Lab 11/21/17 0437 11/22/17 0439 11/23/17 0555 11/24/17 0633 11/25/17 0446  NA 141 139 142 143 142  K 3.8 3.4* 4.2 4.1 3.7  CL 109 109 112* 111 113*  CO2 24 15* 21* 22 21*  GLUCOSE 131* 149* 140* 135* 141*  BUN 36* 33* 28* 24* 21  CREATININE 2.99* 2.62* 2.37* 2.19* 1.90*  CALCIUM 8.4* 7.8* 8.1* 8.4* 8.5*   Liver Function Tests: No results for input(s): AST, ALT, ALKPHOS, BILITOT, PROT, ALBUMIN in the last 168 hours. No results for input(s): LIPASE, AMYLASE in the last 168 hours. No results for input(s): AMMONIA in the last 168 hours. CBC: Recent Labs  Lab 11/21/17 0437 11/22/17 0439 11/23/17 0555 11/24/17 0633 11/25/17 0446  WBC 10.6* 10.9* 13.3* 13.4* 13.8*  HGB 10.0* 9.0* 9.6* 10.1* 9.8*  HCT 31.2*  28.8* 30.1* 32.4* 30.7*  MCV 79.0 79.8 79.4 80.8 79.5  PLT 288 273 276 264 231   Cardiac Enzymes: Recent Labs  Lab 11/19/17 0114 11/19/17 0731 11/19/17 1330  TROPONINI 0.04* <0.03 0.03*   BNP: Invalid input(s): POCBNP CBG: Recent Labs  Lab 11/24/17 1212 11/24/17 1635 11/24/17 2149 11/25/17 0745 11/25/17 1215  GLUCAP 192* 112* 209* 118* 107*   D-Dimer No results for input(s): DDIMER in the last 72 hours. Hgb A1c No results for input(s): HGBA1C in the last 72 hours. Lipid Profile No results for input(s): CHOL, HDL, LDLCALC, TRIG, CHOLHDL, LDLDIRECT in the last 72 hours. Thyroid function studies No results for input(s): TSH, T4TOTAL, T3FREE, THYROIDAB in the last 72 hours.  Invalid input(s): FREET3 Anemia work up No results for input(s): VITAMINB12, FOLATE, FERRITIN, TIBC, IRON, RETICCTPCT in the last 72 hours. Urinalysis    Component Value Date/Time   COLORURINE RED (A) 11/24/2017 1944   APPEARANCEUR TURBID (A) 11/24/2017 1944   LABSPEC 1.010 11/24/2017 1944   PHURINE 7.0 11/24/2017 1944   GLUCOSEU NEGATIVE 11/24/2017 1944   HGBUR LARGE (A) 11/24/2017 1944   BILIRUBINUR NEGATIVE 11/24/2017 1944   KETONESUR NEGATIVE 11/24/2017 1944   PROTEINUR 30 (A) 11/24/2017 1944  UROBILINOGEN 1.0 12/28/2011 1634   NITRITE POSITIVE (A) 11/24/2017 1944   LEUKOCYTESUR TRACE (A) 11/24/2017 1944    Microbiology Recent Results (from the past 240 hour(s))  Blood Culture (routine x 2)     Status: None   Collection Time: 11/17/17 12:33 PM  Result Value Ref Range Status   Specimen Description BLOOD LEFT ANTECUBITAL  Final   Special Requests   Final    BOTTLES DRAWN AEROBIC AND ANAEROBIC Blood Culture results may not be optimal due to an inadequate volume of blood received in culture bottles   Culture   Final    NO GROWTH 5 DAYS Performed at [facility], [address], [place], [place] [address]    Report Status 11/22/2017 FINAL  Final  Blood Culture (routine x 2)      Status: None   Collection Time: 11/17/17  1:16 PM  Result Value Ref Range Status   Specimen Description BLOOD RIGHT HAND  Final   Special Requests   Final    BOTTLES DRAWN AEROBIC AND ANAEROBIC Blood Culture adequate volume   Culture   Final    NO GROWTH 5 DAYS Performed at [facility], [address] [place], [place] [address]    Report Status 11/22/2017 FINAL  Final  Culture, blood (x 2)     Status: None   Collection Time: 11/17/17  4:16 PM  Result Value Ref Range Status   Specimen Description BLOOD LEFT ANTECUBITAL  Final   Special Requests   Final    BOTTLES DRAWN AEROBIC AND ANAEROBIC Blood Culture results may not be optimal due to an inadequate volume of blood received in culture bottles   Culture   Final    NO GROWTH 5 DAYS Performed at [facility], [address] [place], [place] [address]    Report Status 11/22/2017 FINAL  Final  Culture, blood (x 2)     Status: None   Collection Time: 11/17/17  4:21 PM  Result Value Ref Range Status   Specimen Description BLOOD RIGHT HAND  Final   Special Requests   Final    BOTTLES DRAWN AEROBIC AND ANAEROBIC Blood Culture adequate volume   Culture   Final    NO GROWTH 5 DAYS Performed at [facility], [address] [place], [place] [address]    Report Status 11/22/2017 FINAL  Final    Time coordinating discharge: Approximately 40 minutes  Tyrone Nine, MD  [facility] 11/25/2017, 2:29 PM Pager [phone]

## 2017-11-25 NOTE — Progress Notes (Signed)
Pt's persisting in the 180s/ 110s even after 2 rounds of BP meds, including PRN. NP on call was notified. No new orders. Will continue to monitor.

## 2017-11-25 NOTE — Clinical Social Work Placement (Signed)
   CLINICAL SOCIAL WORK PLACEMENT  NOTE  Date:  11/25/2017  Patient Details  Name: [name] MRN: [id number] Date of Birth: Mar 31, 1956  Clinical Social Work is seeking post-discharge placement for this patient at the Skilled  Nursing Facility level of care (*CSW will initial, date and re-position this form in  chart as items are completed):  Yes   Patient/family provided with [facility] Clinical Social Work Department's list of facilities offering this level of care within the geographic area requested by the patient (or if unable, by the patient's family).  Yes   Patient/family informed of their freedom to choose among providers that offer the needed level of care, that participate in Medicare, Medicaid or managed care program needed by the patient, have an available bed and are willing to accept the patient.  Yes   Patient/family informed of 's ownership interest in [facility] and [facility], as well as of the fact that they are under no obligation to receive care at these facilities.  PASRR submitted to EDS on       PASRR number received on       Existing PASRR number confirmed on 11/22/17     FL2 transmitted to all facilities in geographic area requested by pt/family on 11/22/17     FL2 transmitted to all facilities within larger geographic area on       Patient informed that his/her managed care company has contracts with or will negotiate with certain facilities, including the following:        Yes   Patient/family informed of bed offers received.  Patient chooses bed at [facility]     Physician recommends and patient chooses bed at      Patient to be transferred to [facility] on 11/25/17.  Patient to be transferred to facility by [facility]     Patient family notified on 11/25/17 of transfer.  Name of family member notified:  Sister, Marcelino Duster     PHYSICIAN       Additional Comment:     _______________________________________________ Mearl Latin, LCSWA 11/25/2017, 2:58 PM

## 2017-11-25 NOTE — Progress Notes (Addendum)
Addendum:  Pharmacy consulted to transition to Xarelto today.  See note below.   ANTICOAGULATION  CONSULT NOTE - Follow Up Consult  Pharmacy Consult for Heparin (Xarelto on hold) Indication: pulmonary embolus and DVT  No Known Allergies  Patient Measurements: Height: 5\' 7"  (170.2 cm) Weight: 173 lb 8 oz (78.7 kg) IBW/kg (Calculated) : 61.6 Heparin Dosing Weight: 76 kg  Vital Signs: Temp: 98.2 F (36.8 C) (07/12 0540) Temp Source: Oral (07/12 0540) BP: 147/93 (07/12 1042) Pulse Rate: 79 (07/12 1042)  Labs: Recent Labs    11/22/17 1318  11/23/17 0555 11/24/17 0633 11/24/17 0637 11/25/17 0446  HGB  --    < > 9.6* 10.1*  --  9.8*  HCT  --   --  30.1* 32.4*  --  30.7*  PLT  --   --  276 264  --  231  APTT 106*  --  96*  --  101*  --   HEPARINUNFRC 0.80*  --  0.49  --  0.52 0.48  CREATININE  --   --  2.37* 2.19*  --  1.90*   < > = values in this interval not displayed.    Estimated Creatinine Clearance: 33.2 mL/min (A) (by C-G formula based on SCr of 1.9 mg/dL (H)).   Assessment:  62 y/o F who was on heparin from 6/30>>7/2 for new diagnosis of PE 11/13/17 and DVT 11/14/17, transitioned to Apixaban and discharged on 7/2 to SNF.  Patient readmitted 7/4 to hospital with  PE: Progressive clot burden per CT despite reported adherence to eliquis, considered a failed trial. She was started on IV heparin on admit 7/4, then transitioned to Xarelto on 7/7. Transitioned back to heparin on 7/8 with AKI. Last Xarelto dose 7/7 at 7pm (had 2 doses on 7/7).  Xarelto influence on heparin levels has ceased, thus okay to monitor heparin dosing via heparin levels now.    Heparin level is therapeutic, HL= 0.48 on Heparin drip 1050 units/hr  No bleeding noted.   H/H low/stable and pltc wnl.   AKI 2/2 contrast induced nephropathy: Scr is improving, MD expects SCr to continue to improve and will transition back to Xarelto when CrCL =/>30 ml/min & stable.  SCr down to 1.90 today , CrCl is 33 ml/min  today.    Goal of Therapy:  Heparin level 0.3-0.7 units/ml Monitor platelets by anticoagulation protocol: Yes    Plan:  Continue IV heparin drip 1050 units/hr. Daily heparin level and CBC in am.  Follow up transition back to Xarelto.  When CrCl stable >/= 87ml/min, consider transition back to Xarelto 15mg  bid x21 days then 20mg  daily with supper.   Thank you for allowing pharmacy to be part of this patients care team. Noah Delaine, RPh Clinical Pharmacist Please check AMION for all [facility] phone numbers After 10:00 PM, call Main Pharmacy [phone] 11/25/2017,12:16 PM   Addendum:  Pharmacy consulted to transition to Xarelto today.  Plan:  Discontinue IV heparin drip now and give Xarelto 15 mg BID with meals for 21 days then on day #22 (12/16/17) reduce Xarelto dose to 20mg  ONCE daily with suppler.  Will educate patient on Xarelto.   Noah Delaine, RPh Clinical Pharmacist 11/25/2017 12:54 PM

## 2017-11-25 NOTE — Progress Notes (Addendum)
CSW received a call from [place] at [facility] stating that a pt's medication has been discontinued per the FL-2, that it is very expensive and cost-prohibitive.  Pt's D/C summary however states pt's medication Azonex is still a needed medicine.  FL-2 states it is not.  CSW paged pt's provider Dr. Jarvis Newcomer and is awaiting a call back.\  CSW spoke to provider.  Updated D/C summary completed.  CSW sent via the hub to [facility].  Please reconsult if future social work needs arise.  CSW signing off, as social work intervention is no longer needed.  Dorothe Pea. Claudia Felch, LCSW, LCAS, CSI Clinical Social Worker Ph: [phone]

## 2017-11-25 NOTE — Progress Notes (Signed)
[facility] will [facility] to: [facility]  Anticipated DC date: 11/25/17 Family notified: Sister, Marcelino Duster Transport by: Sharin Mons   Per MD patient ready for DC to [facility]. RN, patient, patient's family, and facility notified of DC. Discharge Summary sent to facility. RN given number for report [phone]). DC packet on chart. Ambulance transport requested for patient.   CSW signing off.  Cristobal Goldmann, LCSW Clinical Social Worker [phone]

## 2019-07-08 ENCOUNTER — Emergency Department (HOSPITAL_COMMUNITY): Payer: BC Managed Care – PPO

## 2019-07-08 ENCOUNTER — Other Ambulatory Visit: Payer: Self-pay

## 2019-07-08 ENCOUNTER — Emergency Department (HOSPITAL_COMMUNITY)
Admission: EM | Admit: 2019-07-08 | Discharge: 2019-07-09 | Disposition: A | Discharge status: Home / Self Care | Payer: BC Managed Care – PPO | Attending: Emergency Medicine | Admitting: Emergency Medicine

## 2019-07-08 ENCOUNTER — Encounter (HOSPITAL_COMMUNITY): Payer: Self-pay | Admitting: Emergency Medicine

## 2019-07-08 DIAGNOSIS — R8290 Unspecified abnormal findings in urine: Secondary | ICD-10-CM | POA: Diagnosis not present

## 2019-07-08 DIAGNOSIS — Z79899 Other long term (current) drug therapy: Secondary | ICD-10-CM | POA: Insufficient documentation

## 2019-07-08 DIAGNOSIS — I10 Essential (primary) hypertension: Secondary | ICD-10-CM | POA: Diagnosis not present

## 2019-07-08 DIAGNOSIS — R404 Transient alteration of awareness: Secondary | ICD-10-CM | POA: Diagnosis present

## 2019-07-08 DIAGNOSIS — R5383 Other fatigue: Secondary | ICD-10-CM | POA: Insufficient documentation

## 2019-07-08 DIAGNOSIS — E119 Type 2 diabetes mellitus without complications: Secondary | ICD-10-CM | POA: Diagnosis not present

## 2019-07-08 DIAGNOSIS — F0390 Unspecified dementia without behavioral disturbance: Secondary | ICD-10-CM | POA: Insufficient documentation

## 2019-07-08 DIAGNOSIS — Z7901 Long term (current) use of anticoagulants: Secondary | ICD-10-CM | POA: Diagnosis not present

## 2019-07-08 LAB — COMPREHENSIVE METABOLIC PANEL
ALT: 10 U/L (ref 0–44)
AST: 14 U/L — ABNORMAL LOW (ref 15–41)
Albumin: 3.1 g/dL — ABNORMAL LOW (ref 3.5–5.0)
Alkaline Phosphatase: 49 U/L (ref 38–126)
Anion gap: 9 (ref 5–15)
BUN: 13 mg/dL (ref 8–23)
CO2: 25 mmol/L (ref 22–32)
Calcium: 9.2 mg/dL (ref 8.9–10.3)
Chloride: 108 mmol/L (ref 98–111)
Creatinine, Ser: 0.69 mg/dL (ref 0.44–1.00)
GFR calc Af Amer: >60 mL/min (ref >60)
GFR calc non Af Amer: >60 mL/min (ref >60)
Glucose, Bld: 131 mg/dL — ABNORMAL HIGH (ref 70–99)
Potassium: 3.9 mmol/L (ref 3.5–5.1)
Sodium: 142 mmol/L (ref 135–145)
Total Bilirubin: 0.4 mg/dL (ref 0.3–1.2)
Total Protein: 6.4 g/dL — ABNORMAL LOW (ref 6.5–8.1)

## 2019-07-08 LAB — CBC WITH DIFFERENTIAL/PLATELET
Abs Immature Granulocytes: 0.1 10*3/uL — ABNORMAL HIGH (ref 0.00–0.07)
Basophils Absolute: 0.1 10*3/uL (ref 0.0–0.1)
Basophils Relative: 1 %
Eosinophils Absolute: 0.6 10*3/uL — ABNORMAL HIGH (ref 0.0–0.5)
Eosinophils Relative: 7 %
HCT: 34.8 % — ABNORMAL LOW (ref 36.0–46.0)
Hemoglobin: 10.7 g/dL — ABNORMAL LOW (ref 12.0–15.0)
Immature Granulocytes: 1 %
Lymphocytes Relative: 28 %
Lymphs Abs: 2.4 10*3/uL (ref 0.7–4.0)
MCH: 26.1 pg (ref 26.0–34.0)
MCHC: 30.7 g/dL (ref 30.0–36.0)
MCV: 84.9 fL (ref 80.0–100.0)
Monocytes Absolute: 0.8 10*3/uL (ref 0.1–1.0)
Monocytes Relative: 9 %
Neutro Abs: 4.7 10*3/uL (ref 1.7–7.7)
Neutrophils Relative %: 54 %
Platelets: 245 10*3/uL (ref 150–400)
RBC: 4.1 MIL/uL (ref 3.87–5.11)
RDW: 14 % (ref 11.5–15.5)
WBC: 8.6 10*3/uL (ref 4.0–10.5)
nRBC: 0 % (ref 0.0–0.2)

## 2019-07-08 NOTE — ED Provider Notes (Signed)
[facility] EMERGENCY DEPARTMENT Provider Note  CSN: [id number] Arrival date & time: 07/08/19  2211     History No chief complaint on file.   [name] is a 64 y.o. female.  The history is provided by the patient, the EMS personnel and medical records.   The patient is a 64 year old female with a past medical history of MS, dementia, diabetes who presents to the ED from her nursing facility for unresponsiveness.  Per EMS the patient was seen normal at 8 PM, staff came to check on her at 9 PM and she was "unresponsive", staff reportedly attempted sternal rub and patient would not awaken so EMS was called.  Staff left the room to call EMS and when they returned she was awake and alert and without any complaints.  On EMS arrival the patient was awake and alert and at her baseline.  She is conversant and oriented to person and place.  She says she is tired "because of my MS" and the patient reports that this fatigue has been going on for "years" and that she does not feel any worse at this time than she normally does.  She reports a mild headache which she says is typical for her, she denies any chest pain, shortness of breath, nausea, vomiting, diarrhea, abdominal pain, fevers or chills, urinary symptoms or any other complaints.  When asked if the patient needs any thing she is requesting candy.       Past Medical History:  Diagnosis Date  . Allergic rhinitis   . Blood dyscrasia   . Dementia (HCC)   . Depression   . Diabetes mellitus   . Dysmetabolic syndrome X   . Family history of anesthesia complication    brother "  . Hypertension   . Hypokalemia   . Multiple sclerosis (HCC)   . Neuropathic pain   . Urinary incontinence   . Vitamin D deficiency     Patient Active Problem List   Diagnosis Date Noted  . Leukocytosis 11/17/2017  . Sepsis (HCC) 11/17/2017  . DVT (deep venous thrombosis) Rt Leg 11/14/2017  . Pulmonary embolism (HCC) 11/13/2017  .  Pulmonary emboli (HCC) 11/13/2017  . 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 Cerebrovascular disease 01/17/2015  . Encephalopathy, metabolic 12/29/2011  . Pyelonephritis, acute 12/29/2011  . Hypokalemia 12/29/2011  . Depression   . MS (multiple sclerosis) (HCC) 12/28/2011  . Fever 12/28/2011  . DM2 (diabetes mellitus, type 2) (HCC) 12/28/2011    Past Surgical History:  Procedure Laterality Date  . ABDOMINAL HYSTERECTOMY    . CESAREAN SECTION       OB History   No obstetric history on file.     Family History  Problem Relation Age of Onset  . Multiple sclerosis Brother     Social History   Tobacco Use  . Smoking status: Never Smoker  . Smokeless tobacco: Never Used  Substance Use Topics  . Alcohol use: Yes    Alcohol/week: 0.0 standard drinks    Comment: former   . Drug use: No    Home Medications Prior to Admission medications   Medication Sig Start Date End Date Taking? Authorizing Provider  atorvastatin (LIPITOR) 20 MG tablet Take 1 tablet (20 mg total) by mouth daily. 11/15/17   Shon Hale, MD  barrier cream (NON-SPECIFIED) CREA Apply 1 application topically See admin instructions. Apply 1 application to buttocks after every changing    [provider]  cholecalciferol (VITAMIN D) 400  units TABS tablet Take 800 Units by mouth daily.    [provider]  cloNIDine (CATAPRES) 0.2 MG tablet Take 1 tablet (0.2 mg total) by mouth 3 (three) times daily. 11/25/17   Tyrone Nine, MD  Cranberry 500 MG CAPS Take 500 mg by mouth 2 (two) times daily.    [provider]  ferrous sulfate 325 (65 FE) MG tablet Take 1 tablet (325 mg total) by mouth daily with breakfast. Patient not taking: Reported on 11/17/2017 11/15/17   Shon Hale, MD  gabapentin (NEURONTIN) 300 MG capsule Take 1 capsule (300 mg total) by mouth every 12 (twelve) hours. 11/15/17   Shon Hale, MD  hydrALAZINE (APRESOLINE) 25 MG tablet Take 1 tablet (25 mg  total) by mouth 3 (three) times daily. 11/25/17   Tyrone Nine, MD  HYDROcodone-acetaminophen (NORCO/VICODIN) 5-325 MG tablet Take 1-2 tablets by mouth every 6 (six) hours as needed for moderate pain. 11/25/17   Tyrone Nine, MD  interferon beta-1a (AVONEX PEN) 30 MCG/0.5ML injection Inject 30 mcg into the muscle every 7 (seven) days.      [provider]  metoprolol succinate (TOPROL-XL) 100 MG 24 hr tablet Take 1 tablet (100 mg total) by mouth daily. Take with or immediately following a meal. 11/25/17   Tyrone Nine, MD  Propylene Glycol (SYSTANE BALANCE) 0.6 % SOLN Place 1 drop into both eyes 2 (two) times daily.    [provider]  Rivaroxaban (XARELTO) 15 MG TABS tablet Take 1 tablet (15 mg total) by mouth 2 (two) times daily with a meal. 11/25/17   Tyrone Nine, MD  rivaroxaban (XARELTO) 20 MG TABS tablet Take 1 tablet (20 mg total) by mouth daily with supper. 12/16/17   Tyrone Nine, MD  Travoprost, BAK Free, (TRAVATAN) 0.004 % SOLN ophthalmic solution Place 1 drop into both eyes at bedtime.    [provider]  zinc oxide 20 % ointment Apply 1 application topically See admin instructions. Apply 1 application after every episode of urinary incontinence/wash first    [provider]    Allergies    Patient has no known allergies.  Review of Systems   Review of Systems  Constitutional: Positive for fatigue. Negative for chills and fever.  Respiratory: Negative for cough and shortness of breath.   Cardiovascular: Negative for chest pain.  Gastrointestinal: Negative for abdominal pain, diarrhea, nausea and vomiting.  Genitourinary: Negative for dysuria and frequency.  Neurological: Positive for headaches. Negative for dizziness and light-headedness.  All other systems reviewed and are negative.   Physical Exam Updated Vital Signs BP 105/70   Pulse 76   Temp (!) 96.4 F (35.8 C) (Temporal)   Resp 11   Ht 5\' 7"  (1.702 m)   Wt 78.7 kg   SpO2 98%    BMI 27.17 kg/m   Physical Exam Vitals and nursing note reviewed.  Constitutional:      General: She is not in acute distress.    Appearance: She is well-developed. She is not toxic-appearing or diaphoretic.  HENT:     Head: Normocephalic and atraumatic.     Mouth/Throat:     Mouth: Mucous membranes are moist.     Pharynx: Oropharynx is clear.  Eyes:     Extraocular Movements: Extraocular movements intact.     Conjunctiva/sclera: Conjunctivae normal.     Pupils: Pupils are equal, round, and reactive to light.  Cardiovascular:     Rate and Rhythm: Normal rate and regular rhythm.  Pulses: Normal pulses.     Heart sounds: No murmur.  Pulmonary:     Effort: Pulmonary effort is normal. No respiratory distress.     Breath sounds: Normal breath sounds.  Chest:     Chest wall: No tenderness.  Abdominal:     Palpations: Abdomen is soft.     Tenderness: There is no abdominal tenderness. There is no guarding or rebound.  Musculoskeletal:        General: No tenderness.     Cervical back: Neck supple.     Right lower leg: No edema.     Left lower leg: No edema.     Comments: Contracture of left arm  Skin:    General: Skin is warm and dry.     Capillary Refill: Capillary refill takes less than 2 seconds.  Neurological:     Mental Status: She is alert. Mental status is at baseline.     Cranial Nerves: No cranial nerve deficit.     Sensory: No sensory deficit.     Motor: Weakness (Left arm, bilateral legs, reports chronic.) present.  Psychiatric:        Mood and Affect: Mood normal.        Behavior: Behavior normal.     ED Results / Procedures / Treatments   Labs (all labs ordered are listed, but only abnormal results are displayed) Labs Reviewed  CBC WITH DIFFERENTIAL/PLATELET - Abnormal; Notable for the following components:      Result Value   Hemoglobin 10.7 (*)    HCT 34.8 (*)    Eosinophils Absolute 0.6 (*)    Abs Immature Granulocytes 0.10 (*)    All other  components within normal limits  COMPREHENSIVE METABOLIC PANEL - Abnormal; Notable for the following components:   Glucose, Bld 131 (*)    Total Protein 6.4 (*)    Albumin 3.1 (*)    AST 14 (*)    All other components within normal limits  URINALYSIS, ROUTINE W REFLEX MICROSCOPIC    EKG EKG Interpretation  Date/Time:  Sunday July 08 2019 22:24:43 EST Ventricular Rate:  85 PR Interval:    QRS Duration: 79 QT Interval:  376 QTC Calculation: 448 R Axis:   38 Text Interpretation: Sinus rhythm Abnormal R-wave progression, early transition When compared with ECG of 11/19/2017, voltage criteria for LVH are no longer present Confirmed by Dione Booze ([id number]) on 07/09/2019 12:23:11 AM   Radiology CT Head Wo Contrast  Result Date: 07/08/2019 CLINICAL DATA:  Found unresponsive, history of MS, hypertension, diabetes EXAM: CT HEAD WITHOUT CONTRAST TECHNIQUE: Contiguous axial images were obtained from the base of the skull through the vertex without intravenous contrast. COMPARISON:  11/22/2013 FINDINGS: Brain: Chronic small vessel ischemic changes are seen within the bilateral basal ganglia and periventricular white matter, stable. No acute infarct or hemorrhage. Lateral ventricles and midline structures are stable. No acute extra-axial fluid collections. No mass effect. Vascular: No hyperdense vessel or unexpected calcification. Skull: Normal. Negative for fracture or focal lesion. Sinuses/Orbits: No acute finding. Other: None IMPRESSION: 1. Stable head CT, no acute process. Electronically Signed   By: Sharlet Salina M.D.   On: 07/08/2019 22:43    Procedures Procedures (including critical care time)  Medications Ordered in ED Medications - No data to display  ED Course  I have reviewed the triage vital signs and the nursing notes.  Pertinent labs & imaging results that were available during my care of the patient were reviewed by me and considered in  my medical decision making (see chart  for details).    MDM Rules/Calculators/A&P                      Here because the patient was reportedly unresponsive, told EMS and repeats here that she is just "tired because of my MS".  Reports her fatigue is no different than usual, she has no other complaints and she is at her mental status baseline.  Differential includes sleeping, seizure although this is less likely because she was not convulsing and not confused or sleepy after the episode, intracranial bleed although this is less likely as she is at her mental status baseline and feeling much better, syncope, UTI, electrolyte abnormality.  EKG without evidence of emergent arrhythmia or ischemic changes, no palpitations, chest pain, shortness of breath, lightheadedness, do not suspect arrhythmia.  No emergent changes on labs thus far, urine pending.  CT without acute change.  Patient is well-appearing, do not suspect emergent pathology, plan to follow-up with her in discharge back to her facility. Final Clinical Impression(s) / ED Diagnoses Final diagnoses:  Fatigue, unspecified type    Rx / DC Orders ED Discharge Orders    None       Sheylin Scharnhorst, [place], MD 07/09/19 1219    Elnora Morrison, MD 07/09/19 2333

## 2019-07-08 NOTE — ED Triage Notes (Addendum)
Pt BIB GEMS from [facility] initial call out unresponsiveness.   EMS reporst staff last know normal at 8 pm. Staff returned at 9 pm pt was unresponsive. Staff attempted sternal rub and were unsuccessful. EMS called. Pt alert on EMS arrival.   On arrival to ED pt alert, communicating appropriately, and able to follow commands. No complaints on arrival other than fatigue.  HX MS and dementia

## 2019-07-08 NOTE — ED Notes (Signed)
Transported to CT 

## 2019-07-09 DIAGNOSIS — R5383 Other fatigue: Secondary | ICD-10-CM | POA: Diagnosis not present

## 2019-07-09 LAB — URINALYSIS, MICROSCOPIC (REFLEX): Squamous Epithelial / LPF: NONE SEEN (ref 0–5)

## 2019-07-09 LAB — URINALYSIS, ROUTINE W REFLEX MICROSCOPIC
Bilirubin Urine: NEGATIVE
Glucose, UA: NEGATIVE mg/dL
Hgb urine dipstick: NEGATIVE
Ketones, ur: 15 mg/dL — AB
Leukocytes,Ua: NEGATIVE
Nitrite: POSITIVE — AB
Protein, ur: NEGATIVE mg/dL
Specific Gravity, Urine: 1.025 (ref 1.005–1.030)
pH: 5 (ref 5.0–8.0)

## 2019-07-09 MED ORDER — CEPHALEXIN 250 MG PO CAPS
500.0000 mg | ORAL_CAPSULE | Freq: Once | ORAL | Status: AC
  Administered 2019-07-09: 500 mg via ORAL
  Filled 2019-07-09: qty 2

## 2019-07-09 MED ORDER — CEPHALEXIN 500 MG PO CAPS: 500.0000 mg | ORAL_CAPSULE | Freq: Two times a day (BID) | ORAL | 0 refills | Status: DC

## 2019-07-09 NOTE — ED Notes (Signed)
Pt leaving with [facility] back to facility.

## 2019-07-09 NOTE — ED Notes (Signed)
[facility] Called to take patient to [facility]

## 2019-07-09 NOTE — Discharge Instructions (Addendum)
Your emergency department workup revealed evidence of possible urinary tract infection.  You were given antibiotics in the ER and given a prescription to take for the next week.  If your symptoms worsen, you should return to the ER.

## 2019-07-09 NOTE — ED Notes (Signed)
Report given to Academic librarian at [facility]. Discussed findings and discharge instructions.   Pt to be transported via [facility].

## 2019-07-09 NOTE — ED Provider Notes (Signed)
Patient was thought to have been unresponsive at her facility.    "Per EMS the patient was seen normal at 8 PM, staff came to check on her at 9 PM and she was "unresponsive", staff reportedly attempted sternal rub and patient would not awaken so EMS was called.  Staff left the room to call EMS and when they returned she was awake and alert and without any complaints.  On EMS arrival the patient was awake and alert and at her baseline.  She is conversant and oriented to person and place.  She says she is tired "because of my MS" and the patient reports that this fatigue has been going on for "years" and that she does not feel any worse at this time than she normally does. "  Patient reassessed by me.  She is alert and oriented.  She states that she feels fine.  I asked her about dysuria and she said that she has had a little bit of discomfort.  She does have an abnormal UA, so I will treat with keflex and send urine culture.  VSS.  NAD.  I think she appears stable for discharge.   Roxy Horseman, PA-C 07/09/19 0206    Dione Booze, MD 07/09/19 [phone]

## 2019-07-10 LAB — URINE CULTURE: Culture: >=100000 — AB

## 2019-07-11 ENCOUNTER — Telehealth: Payer: Self-pay

## 2019-07-11 NOTE — Telephone Encounter (Signed)
Post ED Visit - Positive Culture Follow-up  Culture report reviewed by antimicrobial stewardship pharmacist: [facility] []  , Pharm.D. []  Enzo Bi, Pharm.D., BCPS AQ-ID []  , Pharm.D., BCPS []  Celedonio Miyamoto, Pharm.D., BCPS []  [place], Garvin Fila.D., BCPS, AAHIVP []  , Pharm.D., BCPS, AAHIVP []  Georgina Pillion, PharmD, BCPS []  , PharmD, BCPS []  [place], PharmD, BCPS []  [place], PharmD []  , PharmD, BCPS []  Estella Husk, PharmD PharmD [facility] []  , PharmD []  Phillips Climes, PharmD []  , PharmD []  Agapito Games, Rph []  ) Verlan Friends, PharmD []  , PharmD []  Mervyn Gay, PharmD []  , PharmD []  Vinnie Level, PharmD []  Gerrit Halls, PharmD []  Wonda Olds, PharmD []  , PharmD []  Len Childs, PharmD   Positive urine culture Treated with Cephalexin, organism sensitive to the same and no further patient follow-up is required at this time.  07/11/2019, 9:32 AM

## 2021-09-10 ENCOUNTER — Emergency Department (HOSPITAL_COMMUNITY)
Admission: EM | Admit: 2021-09-10 | Discharge: 2021-09-11 | Disposition: A | Discharge status: Home / Self Care | Payer: BC Managed Care – PPO | Attending: Emergency Medicine | Admitting: Emergency Medicine

## 2021-09-10 ENCOUNTER — Other Ambulatory Visit: Payer: Self-pay

## 2021-09-10 DIAGNOSIS — R410 Disorientation, unspecified: Secondary | ICD-10-CM | POA: Diagnosis not present

## 2021-09-10 DIAGNOSIS — E119 Type 2 diabetes mellitus without complications: Secondary | ICD-10-CM | POA: Insufficient documentation

## 2021-09-10 DIAGNOSIS — R4182 Altered mental status, unspecified: Secondary | ICD-10-CM | POA: Diagnosis present

## 2021-09-10 DIAGNOSIS — I10 Essential (primary) hypertension: Secondary | ICD-10-CM | POA: Diagnosis not present

## 2021-09-10 DIAGNOSIS — Z7901 Long term (current) use of anticoagulants: Secondary | ICD-10-CM | POA: Diagnosis not present

## 2021-09-10 DIAGNOSIS — Z79899 Other long term (current) drug therapy: Secondary | ICD-10-CM | POA: Insufficient documentation

## 2021-09-10 DIAGNOSIS — E860 Dehydration: Secondary | ICD-10-CM

## 2021-09-10 LAB — CBC
HCT: 31.4 % — ABNORMAL LOW (ref 36.0–46.0)
Hemoglobin: 10 g/dL — ABNORMAL LOW (ref 12.0–15.0)
MCH: 23.6 pg — ABNORMAL LOW (ref 26.0–34.0)
MCHC: 31.8 g/dL (ref 30.0–36.0)
MCV: 74.1 fL — ABNORMAL LOW (ref 80.0–100.0)
Platelets: 352 10*3/uL (ref 150–400)
RBC: 4.24 MIL/uL (ref 3.87–5.11)
RDW: 17 % — ABNORMAL HIGH (ref 11.5–15.5)
WBC: 9 10*3/uL (ref 4.0–10.5)
nRBC: 0 % (ref 0.0–0.2)

## 2021-09-10 LAB — COMPREHENSIVE METABOLIC PANEL
ALT: 7 U/L (ref 0–44)
AST: 16 U/L (ref 15–41)
Albumin: 3.2 g/dL — ABNORMAL LOW (ref 3.5–5.0)
Alkaline Phosphatase: 65 U/L (ref 38–126)
Anion gap: 7 (ref 5–15)
BUN: 14 mg/dL (ref 8–23)
CO2: 24 mmol/L (ref 22–32)
Calcium: 8.9 mg/dL (ref 8.9–10.3)
Chloride: 108 mmol/L (ref 98–111)
Creatinine, Ser: 0.75 mg/dL (ref 0.44–1.00)
GFR, Estimated: >60 mL/min (ref >60)
Glucose, Bld: 170 mg/dL — ABNORMAL HIGH (ref 70–99)
Potassium: 4.1 mmol/L (ref 3.5–5.1)
Sodium: 139 mmol/L (ref 135–145)
Total Bilirubin: 0.3 mg/dL (ref 0.3–1.2)
Total Protein: 6.5 g/dL (ref 6.5–8.1)

## 2021-09-10 LAB — AMMONIA: Ammonia: 15 umol/L (ref 9–35)

## 2021-09-10 MED ORDER — SODIUM CHLORIDE 0.9 % IV BOLUS
500.0000 mL | Freq: Once | INTRAVENOUS | Status: AC
  Administered 2021-09-11: 500 mL via INTRAVENOUS

## 2021-09-10 NOTE — ED Provider Notes (Signed)
?[facility] EMERGENCY DEPARTMENT ?Provider Note ? ? ?CSN: [id number] ?Arrival date & time: 09/10/21  2119 ? ?  ? ?History ? ?Chief Complaint  ?Patient presents with  ? Altered Mental Status  ?  Personnel at [facility] report patient is more lethargic and harder to wake up this evening.   ? ? ?[name] is a 66 y.o. female. With past medical history of T2DM, MS, HTN who presents to the emergency department with altered mental status. ? ?LEVEL 5 CAVEAT: AMS ? ?From [facility] with report of lethargy this evening.   ? ?Collateral: Nia (spelling?) caretaker at [facility]. She states that the patient kept saying "she was fine." But that she was not responding "like she normally does." She states that her blood pressure was low and that she normally runs 115-138. Also states her BG was high and that it is normally under control. No fevers or complaints recently from the patient. She is bedbound at baseline from MS. Ate all of her dinner this evening. Taking all of her medications. Nurse held her BP meds this evening because she was hypotensive.  ?HPI ? ?  ? ?Home Medications ?Prior to Admission medications   ?Medication Sig Start Date End Date Taking? Authorizing Provider  ?atorvastatin (LIPITOR) 20 MG tablet Take 1 tablet (20 mg total) by mouth daily. 11/15/17   Shon Hale, MD  ?barrier cream (NON-SPECIFIED) CREA Apply 1 application topically See admin instructions. Apply 1 application to buttocks after every changing    [provider]  ?cephALEXin (KEFLEX) 500 MG capsule Take 1 capsule (500 mg total) by mouth 2 (two) times daily. 07/09/19   Roxy Horseman, PA-C  ?cholecalciferol (VITAMIN D) 400 units TABS tablet Take 800 Units by mouth daily.    [provider]  ?cloNIDine (CATAPRES) 0.2 MG tablet Take 1 tablet (0.2 mg total) by mouth 3 (three) times daily. 11/25/17   Tyrone Nine, MD  ?Cranberry 500 MG CAPS Take 500 mg by mouth 2 (two) times daily.    [provider]  ?ferrous sulfate 325 (65 FE) MG tablet Take 1 tablet (325 mg total) by mouth daily with breakfast. ?Patient not taking: Reported on 11/17/2017 11/15/17   Shon Hale, MD  ?gabapentin (NEURONTIN) 300 MG capsule Take 1 capsule (300 mg total) by mouth every 12 (twelve) hours. 11/15/17   Shon Hale, MD  ?hydrALAZINE (APRESOLINE) 25 MG tablet Take 1 tablet (25 mg total) by mouth 3 (three) times daily. 11/25/17   Tyrone Nine, MD  ?HYDROcodone-acetaminophen (NORCO/VICODIN) 5-325 MG tablet Take 1-2 tablets by mouth every 6 (six) hours as needed for moderate pain. 11/25/17   Tyrone Nine, MD  ?interferon beta-1a (AVONEX PEN) 30 MCG/0.5ML injection Inject 30 mcg into the muscle every 7 (seven) days.      [provider]  ?metoprolol succinate (TOPROL-XL) 100 MG 24 hr tablet Take 1 tablet (100 mg total) by mouth daily. Take with or immediately following a meal. 11/25/17   Tyrone Nine, MD  ?Propylene Glycol (SYSTANE BALANCE) 0.6 % SOLN Place 1 drop into both eyes 2 (two) times daily.    [provider]  ?Rivaroxaban (XARELTO) 15 MG TABS tablet Take 1 tablet (15 mg total) by mouth 2 (two) times daily with a meal. 11/25/17   Tyrone Nine, MD  ?rivaroxaban (XARELTO) 20 MG TABS tablet Take 1 tablet (20 mg total) by mouth daily with supper. 12/16/17   Tyrone Nine, MD  ?Travoprost,  BAK Free, (TRAVATAN) 0.004 % SOLN ophthalmic solution Place 1 drop into both eyes at bedtime.    [provider]  ?zinc oxide 20 % ointment Apply 1 application topically See admin instructions. Apply 1 application after every episode of urinary incontinence/wash first    [provider]  ?   ? ?Allergies    ?Patient has no known allergies.   ? ?Review of Systems   ?Review of Systems  ?Unable to perform ROS: Mental status change  ? ?Physical Exam ?Updated Vital Signs ?BP (!) 90/56   Pulse 79   Temp 98.4 ?F (36.9 ?C) (Oral)   Resp 18   SpO2 98%  ?Physical Exam ?Vitals and nursing note reviewed.   ?Constitutional:   ?   General: She is not in acute distress. ?   Appearance: Normal appearance. She is ill-appearing. She is not toxic-appearing.  ?HENT:  ?   Head: Normocephalic and atraumatic.  ?   Nose: Nose normal.  ?   Mouth/Throat:  ?   Mouth: Mucous membranes are moist.  ?   Pharynx: Oropharynx is clear.  ?Eyes:  ?   General: No scleral icterus. ?   Extraocular Movements: Extraocular movements intact.  ?   Conjunctiva/sclera: Conjunctivae normal.  ?   Pupils: Pupils are equal, round, and reactive to light.  ?Cardiovascular:  ?   Rate and Rhythm: Normal rate and regular rhythm.  ?   Pulses: Normal pulses.  ?   Heart sounds: No murmur heard. ?Pulmonary:  ?   Effort: Pulmonary effort is normal. No respiratory distress.  ?   Breath sounds: Normal breath sounds.  ?Abdominal:  ?   General: Bowel sounds are normal. There is no distension.  ?   Palpations: Abdomen is soft.  ?   Tenderness: There is no abdominal tenderness.  ?Musculoskeletal:  ?   Cervical back: Neck supple.  ?   Right lower leg: No edema.  ?   Left lower leg: No edema.  ?   Comments: Contracted   ?Skin: ?   General: Skin is warm and dry.  ?   Capillary Refill: Capillary refill takes less than 2 seconds.  ?   Comments: Stage 2 on sacrum  ?Stage 1 on left heel   ?Neurological:  ?   General: No focal deficit present.  ?   Mental Status: She is lethargic and disoriented.  ?   Cranial Nerves: No cranial nerve deficit.  ?   Sensory: Sensation is intact.  ?   Motor: Weakness present.  ?Psychiatric:     ?   Behavior: Behavior is slowed.  ? ? ?ED Results / Procedures / Treatments   ?Labs ?(all labs ordered are listed, but only abnormal results are displayed) ?Labs Reviewed  ?COMPREHENSIVE METABOLIC PANEL - Abnormal; Notable for the following components:  ?    Result Value  ? Glucose, Bld 170 (*)   ? Albumin 3.2 (*)   ? All other components within normal limits  ?CBC - Abnormal; Notable for the following components:  ? Hemoglobin 10.0 (*)   ? HCT 31.4 (*)    ? MCV 74.1 (*)   ? MCH 23.6 (*)   ? RDW 17.0 (*)   ? All other components within normal limits  ?AMMONIA  ?URINALYSIS, ROUTINE W REFLEX MICROSCOPIC  ?CBG MONITORING, ED  ? ?EKG ?None ? ?Radiology ?No results found. ? ?Procedures ?Procedures  ? ?Medications Ordered in ED ?Medications  ?sodium chloride 0.9 % bolus 500 mL (has no  administration in time range)  ? ? ?ED Course/ Medical Decision Making/ A&P ?  ?                        ?Medical Decision Making ?Amount and/or Complexity of Data Reviewed ?Labs: ordered. ?Radiology: ordered. ? ?66 year old female who presents to the emergency department with altered mental status.  She does appear to be somewhat lethargic today on exam.  She requires multiple commands to answer a question and appears somewhat confused.  She declines having any pain when she is answering my questions.  She has a nonfocal neuro exam.  Her physical exam is otherwise unremarkable.  She is contracted.  There are no open wounds concerning for infection. ?I spoke with her facility and they state that her blood pressure was low this evening and her blood sugar was elevated from her baseline.  She has been eating and drinking normally.  She has bedbound at baseline with no falls.  There is what appears to be Xarelto and hydrocodone on her med list and I confirmed that she is not taking these medications.  ?-Pupils are not pinpoint, doubt that she is had opioid overdose ?-Nonfocal neuro exam however will obtain CT head which is pending ?-CBC without leukocytosis, she is afebrile.  No open wounds ?-UA pending ?-No electrolyte abnormalities, AKI or transaminitis ?-Her blood glucose is 170 which is not dangerously elevated. ?-Ammonia is 15 which is negative ?She is hypotensive here with blood pressures in the 90s.  Facility states they held her blood pressure medication this evening due to this.  I will give her 500 mL fluid bolus challenge and reassess. ? ?Care of patient is being handed off to Berle Mull, PA-C at this time.  Ultimate disposition based on completed work-up. ?Final Clinical Impression(s) / ED Diagnoses ?Final diagnoses:  ?None  ? ? ?Rx / DC Orders ?ED Discharge Orders   ? ? None  ? ?  ? ?

## 2021-09-10 NOTE — ED Triage Notes (Signed)
Patient is alert and oriented to person, place, and time but is very drowsy and falls asleep during conversation.  ?

## 2021-09-10 NOTE — ED Provider Notes (Signed)
Received from Ruta Hinds, PA-C please see her note for full detail ? ?Patient with medical history including type 2 diabetes, MS, hypertension presents  with altered mental status per previous provider coming from [facility] due to appearing lethargic this evening, previous provider spoke with caretaker states that she was fine but was not responding like she normally does.  She is bedbound, has been eating and drinking no URI-like symptoms urinary symptoms, has taken all medications. ? ?Per previous provider follow-up on CT head and lab work if unremarkable and stable can be discharged home. ? ?Labs-which I have personally reviewed and interpreted CBC shows microcytic anemia hemoglobin 10 at baseline, CMP shows glucose of 170 albumin 3.2, ammonia 15, rapid urine drug screen negative, UA unremarkable ? ?Imaging-which I have personally reviewed and interpreted, chest x-ray and CT head were both negative for acute findings. ? ?EKG-which I have reviewed personally interpreted sinus without signs of ischemia ? ?Patient was reassessed after fluids, she is found resting comfortably, having no complaints, vital signs are reassuring, she is agreeable for discharge ? ?Disposition ? ?Altered mental status-since resolved suspect she was likely dehydrated, will recommend that she continues to hydrate, follow-up with her PCP for further evaluation and strict return precautions. ?  ?Carroll Sage, PA-C ?09/11/21 0119 ? ?  ?Cathren Laine, MD ?09/11/21 0522 ? ?

## 2021-09-10 NOTE — ED Notes (Signed)
Patient to ER hall bed 20 via EMS that reports patient was sent to ER from [facility] facility for increased lethargy this evening. Upon nurses initial assessment patient is alert to person and place. Patient is a very poor historian, patient can recall her birthday but cannot recall what year or month it is. Patient is very sleepy and falls asleep during conversation, patient responds to verbal and painful stimuli.  ?

## 2021-09-11 ENCOUNTER — Emergency Department (HOSPITAL_COMMUNITY): Payer: BC Managed Care – PPO

## 2021-09-11 DIAGNOSIS — R410 Disorientation, unspecified: Secondary | ICD-10-CM | POA: Diagnosis not present

## 2021-09-11 LAB — URINALYSIS, ROUTINE W REFLEX MICROSCOPIC
Bilirubin Urine: NEGATIVE
Glucose, UA: NEGATIVE mg/dL
Hgb urine dipstick: NEGATIVE
Ketones, ur: NEGATIVE mg/dL
Nitrite: NEGATIVE
Protein, ur: NEGATIVE mg/dL
Specific Gravity, Urine: 1.018 (ref 1.005–1.030)
pH: 5 (ref 5.0–8.0)

## 2021-09-11 LAB — RAPID URINE DRUG SCREEN, HOSP PERFORMED
Amphetamines: NOT DETECTED
Barbiturates: NOT DETECTED
Benzodiazepines: NOT DETECTED
Cocaine: NOT DETECTED
Opiates: NOT DETECTED
Tetrahydrocannabinol: NOT DETECTED

## 2021-09-11 NOTE — ED Notes (Signed)
EMS present in ED for patient discharge. Report called and care endorsed to [facility]. Perlie Gold reports patient has returned to her baseline and has dementia at baseline. Patient leaves department via EMS. ?

## 2021-09-11 NOTE — ED Notes (Signed)
Patient is [facility] more alert at this time. Patient recalls name, date of birth, and where she is at. Patient requests discharge. Call bell in reach. Provider aware.  ?

## 2021-09-11 NOTE — ED Notes (Addendum)
Patient remains resting in bed and awaiting EMS transport to home.  ?

## 2021-09-11 NOTE — ED Notes (Signed)
Patient remains resting in bed awaiting EMS transport.  ?

## 2021-09-11 NOTE — ED Notes (Signed)
[facility] Called 

## 2021-09-11 NOTE — Discharge Instructions (Addendum)
Lab work and imaging all reassuring, you were dehydrated please continue to hydrate at home continue all home medications ? ?Follow-up with PCP as needed ? ?Come back to the emergency department if you develop chest pain, shortness of breath, severe abdominal pain, uncontrolled nausea, vomiting, diarrhea. ? ?

## 2021-10-21 ENCOUNTER — Other Ambulatory Visit: Payer: Self-pay

## 2021-10-21 ENCOUNTER — Encounter (HOSPITAL_COMMUNITY): Payer: Self-pay | Admitting: Emergency Medicine

## 2021-10-21 ENCOUNTER — Emergency Department (HOSPITAL_COMMUNITY)
Admission: EM | Admit: 2021-10-21 | Discharge: 2021-10-21 | Disposition: A | Discharge status: Skilled Nursing Facility | Payer: BC Managed Care – PPO | Attending: Emergency Medicine | Admitting: Emergency Medicine

## 2021-10-21 ENCOUNTER — Emergency Department (HOSPITAL_COMMUNITY): Payer: BC Managed Care – PPO

## 2021-10-21 DIAGNOSIS — S0990XA Unspecified injury of head, initial encounter: Secondary | ICD-10-CM | POA: Insufficient documentation

## 2021-10-21 DIAGNOSIS — W06XXXA Fall from bed, initial encounter: Secondary | ICD-10-CM | POA: Insufficient documentation

## 2021-10-21 DIAGNOSIS — Z7901 Long term (current) use of anticoagulants: Secondary | ICD-10-CM | POA: Diagnosis not present

## 2021-10-21 DIAGNOSIS — S0011XA Contusion of right eyelid and periocular area, initial encounter: Secondary | ICD-10-CM | POA: Insufficient documentation

## 2021-10-21 DIAGNOSIS — S0511XA Contusion of eyeball and orbital tissues, right eye, initial encounter: Secondary | ICD-10-CM | POA: Diagnosis present

## 2021-10-21 LAB — I-STAT CHEM 8, ED
BUN: 14 mg/dL (ref 8–23)
Calcium, Ion: 0.5 mmol/L — CL (ref 1.15–1.40)
Chloride: 103 mmol/L (ref 98–111)
Creatinine, Ser: 0.7 mg/dL (ref 0.44–1.00)
Glucose, Bld: 132 mg/dL — ABNORMAL HIGH (ref 70–99)
HCT: 37 % (ref 36.0–46.0)
Hemoglobin: 12.6 g/dL (ref 12.0–15.0)
Potassium: 4.5 mmol/L (ref 3.5–5.1)
Sodium: 141 mmol/L (ref 135–145)
TCO2: 23 mmol/L (ref 22–32)

## 2021-10-21 NOTE — Discharge Instructions (Signed)
There is no bleeding inside your skull on CT.  There was no facial fracture.  I would like your family doctor to recheck this in about a week or 2.

## 2021-10-21 NOTE — ED Triage Notes (Signed)
Pt via [facility] from [facility]. Hx MS and tried to roll over in bed, and her torso rolled off the bed, hitting her head on the floor. Legs remained in the bed. On Xarelto, bruising on R forehead. Pt A/O x2 at baseline and same today.

## 2021-10-21 NOTE — ED Notes (Signed)
Attempted to call report to [facility] w/o answer.  Will attempt to call again.

## 2021-10-21 NOTE — ED Notes (Signed)
Report given to [facility] and [employer]. Pt DC'd from ED taken via stretcher.

## 2021-10-21 NOTE — ED Notes (Signed)
[facility] called for transport.  

## 2021-10-21 NOTE — ED Notes (Signed)
Patient transported to CT 

## 2021-10-21 NOTE — ED Provider Notes (Signed)
[facility] EMERGENCY DEPARTMENT Provider Note   CSN: [id number] Arrival date & time: 10/21/21  1002     History  Chief Complaint  Patient presents with   Level 2 / fall    [name] is a 66 y.o. female.  66 yo F with a chief complaints of a fall out of bed.  Patient is typically bedbound and had fallen at some point in the night.  Found to have bruising around the right eye and was sent to the ED for evaluation.  She is on Xarelto.  Took her last dose last night.       Home Medications Prior to Admission medications   Medication Sig Start Date End Date Taking? Authorizing Provider  ammonium lactate (LAC-HYDRIN) 12 % lotion Apply 1 application. topically every 30 (thirty) days.   Yes [provider]  ARTIFICIAL TEAR SOLUTION OP Place 1 drop into both eyes 2 (two) times daily.   Yes [provider]  atorvastatin (LIPITOR) 10 MG tablet Take 5 mg by mouth every evening.   Yes [provider]  Cholecalciferol (VITAMIN D3) 10 MCG (400 UNIT) CAPS Take 800 Units by mouth every evening.   Yes [provider]  cloNIDine (CATAPRES) 0.2 MG tablet Take 1 tablet (0.2 mg total) by mouth 3 (three) times daily. Patient taking differently: Take 0.2 mg by mouth See admin instructions. Take 0.2 mg by mouth three times a day and hold for a Systolic reading less than 110 11/25/17  Yes Tyrone Nine, MD  atorvastatin (LIPITOR) 20 MG tablet Take 1 tablet (20 mg total) by mouth daily. Patient not taking: Reported on 10/21/2021 11/15/17   Shon Hale, MD  barrier cream (NON-SPECIFIED) CREA Apply 1 application topically See admin instructions. Apply 1 application to buttocks after every changing    [provider]  cephALEXin (KEFLEX) 500 MG capsule Take 1 capsule (500 mg total) by mouth 2 (two) times daily. Patient not taking: Reported on 10/21/2021 07/09/19   Roxy Horseman, PA-C  ferrous sulfate 325 (65 FE) MG tablet Take 1 tablet (325  mg total) by mouth daily with breakfast. Patient not taking: Reported on 11/17/2017 11/15/17   Shon Hale, MD  gabapentin (NEURONTIN) 300 MG capsule Take 1 capsule (300 mg total) by mouth every 12 (twelve) hours. 11/15/17   Shon Hale, MD  hydrALAZINE (APRESOLINE) 25 MG tablet Take 1 tablet (25 mg total) by mouth 3 (three) times daily. 11/25/17   Tyrone Nine, MD  HYDROcodone-acetaminophen (NORCO/VICODIN) 5-325 MG tablet Take 1-2 tablets by mouth every 6 (six) hours as needed for moderate pain. 11/25/17   Tyrone Nine, MD  interferon beta-1a (AVONEX) 30 MCG/0.5ML injection Inject 30 mcg into the muscle every 7 (seven) days.      [provider]  metoprolol succinate (TOPROL-XL) 100 MG 24 hr tablet Take 1 tablet (100 mg total) by mouth daily. Take with or immediately following a meal. 11/25/17   Tyrone Nine, MD  Propylene Glycol 0.6 % SOLN Place 1 drop into both eyes 2 (two) times daily.    [provider]  Rivaroxaban (XARELTO) 15 MG TABS tablet Take 1 tablet (15 mg total) by mouth 2 (two) times daily with a meal. 11/25/17   Tyrone Nine, MD  rivaroxaban (XARELTO) 20 MG TABS tablet Take 1 tablet (20 mg total) by mouth daily with supper. 12/16/17   Tyrone Nine, MD  Travoprost, BAK Free, (TRAVATAN) 0.004 % SOLN ophthalmic solution Place  1 drop into both eyes at bedtime.    [provider]  zinc oxide 20 % ointment Apply 1 application topically See admin instructions. Apply 1 application after every episode of urinary incontinence/wash first    [provider]      Allergies    Patient has no known allergies.    Review of Systems   Review of Systems  Physical Exam Updated Vital Signs BP 137/85   Pulse 88   Temp 98.1 F (36.7 C) (Oral)   Resp 17   SpO2 98%  Physical Exam Vitals and nursing note reviewed.  Constitutional:      General: She is not in acute distress.    Appearance: She is well-developed. She is not diaphoretic.  HENT:     Head:  Normocephalic.     Comments: Right periorbital hematoma, not obviously acute on exam.  Extraocular motion intact. Eyes:     Pupils: Pupils are equal, round, and reactive to light.  Cardiovascular:     Rate and Rhythm: Normal rate and regular rhythm.     Heart sounds: No murmur heard.   No friction rub. No gallop.  Pulmonary:     Effort: Pulmonary effort is normal.     Breath sounds: No wheezing or rales.  Abdominal:     General: There is no distension.     Palpations: Abdomen is soft.     Tenderness: There is no abdominal tenderness.  Musculoskeletal:        General: No tenderness.     Cervical back: Normal range of motion and neck supple.  Skin:    General: Skin is warm and dry.  Neurological:     Mental Status: She is alert and oriented to person, place, and time.  Psychiatric:        Behavior: Behavior normal.    ED Results / Procedures / Treatments   Labs (all labs ordered are listed, but only abnormal results are displayed) Labs Reviewed  I-STAT CHEM 8, ED - Abnormal; Notable for the following components:      Result Value   Glucose, Bld 132 (*)    Calcium, Ion 0.50 (*)    All other components within normal limits    EKG None  Radiology CT Head Wo Contrast  Result Date: 10/21/2021 CLINICAL DATA:  Head trauma, minor (Age >= 65y); Facial trauma, blunt EXAM: CT HEAD WITHOUT CONTRAST CT MAXILLOFACIAL WITHOUT CONTRAST TECHNIQUE: Multidetector CT imaging of the head and maxillofacial structures were performed using the standard protocol without intravenous contrast. Multiplanar CT image reconstructions of the maxillofacial structures were also generated. RADIATION DOSE REDUCTION: This exam was performed according to the departmental dose-optimization program which includes automated exposure control, adjustment of the mA and/or kV according to patient size and/or use of iterative reconstruction technique. COMPARISON:  CT head April 28 23. FINDINGS: CT HEAD FINDINGS Brain: No  evidence of acute infarction, hemorrhage, hydrocephalus, extra-axial collection or mass lesion/mass effect. Patchy white matter hypodensities, nonspecific but with chronic microvascular ischemic disease. Cerebral atrophy. Vascular: No hyperdense vessel identified calcific intracranial sclerosis. Skull: Right forehead contusion without acute calvarial fracture. Other: No mastoid effusions. CT MAXILLOFACIAL FINDINGS Osseous: No fracture or mandibular dislocation. No destructive process. Edentulous. Orbits: Negative. No traumatic or inflammatory finding. Sinuses: Air-fluid levels with frothy secretions in the sphenoid sinuses. Otherwise, clear sinuses. Soft tissues: Right forehead contusion. IMPRESSION: 1. No evidence acute intracranial abnormality or facial fracture. 2. Right forehead contusion without acute calvarial fracture. 3. Air-fluid levels with frothy  secretions in the sphenoid sinuses. Electronically Signed   By: Feliberto Harts M.D.   On: 10/21/2021 12:09   CT Maxillofacial Wo Contrast  Result Date: 10/21/2021 CLINICAL DATA:  Head trauma, minor (Age >= 65y); Facial trauma, blunt EXAM: CT HEAD WITHOUT CONTRAST CT MAXILLOFACIAL WITHOUT CONTRAST TECHNIQUE: Multidetector CT imaging of the head and maxillofacial structures were performed using the standard protocol without intravenous contrast. Multiplanar CT image reconstructions of the maxillofacial structures were also generated. RADIATION DOSE REDUCTION: This exam was performed according to the departmental dose-optimization program which includes automated exposure control, adjustment of the mA and/or kV according to patient size and/or use of iterative reconstruction technique. COMPARISON:  CT head April 28 23. FINDINGS: CT HEAD FINDINGS Brain: No evidence of acute infarction, hemorrhage, hydrocephalus, extra-axial collection or mass lesion/mass effect. Patchy white matter hypodensities, nonspecific but with chronic microvascular ischemic disease.  Cerebral atrophy. Vascular: No hyperdense vessel identified calcific intracranial sclerosis. Skull: Right forehead contusion without acute calvarial fracture. Other: No mastoid effusions. CT MAXILLOFACIAL FINDINGS Osseous: No fracture or mandibular dislocation. No destructive process. Edentulous. Orbits: Negative. No traumatic or inflammatory finding. Sinuses: Air-fluid levels with frothy secretions in the sphenoid sinuses. Otherwise, clear sinuses. Soft tissues: Right forehead contusion. IMPRESSION: 1. No evidence acute intracranial abnormality or facial fracture. 2. Right forehead contusion without acute calvarial fracture. 3. Air-fluid levels with frothy secretions in the sphenoid sinuses. Electronically Signed   By: Feliberto Harts M.D.   On: 10/21/2021 12:09    Procedures Procedures    Medications Ordered in ED Medications - No data to display  ED Course/ Medical Decision Making/ A&P                           Medical Decision Making Amount and/or Complexity of Data Reviewed Radiology: ordered.   66 yo F with a chief complaints of a fall from her bed.  This occurred reportedly at some point last night and had signs of head trauma and was sent here for evaluation.  Per hospital protocol the patient arrived as a level 2 trauma.  We will obtain a CT of the head and the face.  Difficult IV stick obtained a i-STAT Chem-8 with a significantly low ionized calcium.  She is having no symptoms consistent with the same.  May be due to the difficulty of IV stick.  We will have her rechecked at her facility.  CT of the head and face without acute intracranial pathology or obvious facial fracture on my independent interpretation.  Patient continues to do well.  Will discharge home.  12:25 PM:  I have discussed the diagnosis/risks/treatment options with the patient.  Evaluation and diagnostic testing in the emergency department does not suggest an emergent condition requiring admission or immediate  intervention beyond what has been performed at this time.  They will follow up with PCP. We also discussed returning to the ED immediately if new or worsening sx occur. We discussed the sx which are most concerning (e.g., sudden worsening pain, fever, inability to tolerate by mouth) that necessitate immediate return. Medications administered to the patient during their visit and any new prescriptions provided to the patient are listed below.  Medications given during this visit Medications - No data to display   The patient appears reasonably screen and/or stabilized for discharge and I doubt any other medical condition or other [facility] requiring further screening, evaluation, or treatment in the ED at this time prior to discharge.  Final Clinical Impression(s) / ED Diagnoses Final diagnoses:  Fall from bed, initial encounter    Rx / DC Orders ED Discharge Orders     None         Melene Plan, DO 10/21/21 1225

## 2021-10-21 NOTE — ED Notes (Signed)
ED Provider at bedside. 

## 2021-10-21 NOTE — ED Notes (Signed)
Report called to [facility] nurse.

## 2021-10-21 NOTE — Progress Notes (Signed)
Orthopedic Tech Progress Note Patient Details:  [name] 06-04-1955 [id number]  Level 2 trauma   Patient ID: [name], female   DOB: [birth year], 66 y.o.   MRN: [id number]  [name] 10/21/2021, 10:08 AM

## 2021-12-30 ENCOUNTER — Encounter (HOSPITAL_COMMUNITY): Payer: Self-pay

## 2021-12-30 ENCOUNTER — Other Ambulatory Visit: Payer: Self-pay

## 2021-12-30 ENCOUNTER — Emergency Department (HOSPITAL_COMMUNITY)
Admission: EM | Admit: 2021-12-30 | Discharge: 2021-12-31 | Disposition: A | Discharge status: Skilled Nursing Facility | Payer: BC Managed Care – PPO | Attending: Emergency Medicine | Admitting: Emergency Medicine

## 2021-12-30 ENCOUNTER — Emergency Department (HOSPITAL_COMMUNITY): Payer: BC Managed Care – PPO

## 2021-12-30 DIAGNOSIS — W06XXXA Fall from bed, initial encounter: Secondary | ICD-10-CM | POA: Diagnosis not present

## 2021-12-30 DIAGNOSIS — Z7901 Long term (current) use of anticoagulants: Secondary | ICD-10-CM | POA: Diagnosis not present

## 2021-12-30 DIAGNOSIS — Z86718 Personal history of other venous thrombosis and embolism: Secondary | ICD-10-CM | POA: Diagnosis not present

## 2021-12-30 DIAGNOSIS — S0990XA Unspecified injury of head, initial encounter: Secondary | ICD-10-CM | POA: Diagnosis present

## 2021-12-30 DIAGNOSIS — Y92129 Unspecified place in nursing home as the place of occurrence of the external cause: Secondary | ICD-10-CM | POA: Insufficient documentation

## 2021-12-30 DIAGNOSIS — S0083XA Contusion of other part of head, initial encounter: Secondary | ICD-10-CM | POA: Insufficient documentation

## 2021-12-30 DIAGNOSIS — S060XAA Concussion with loss of consciousness status unknown, initial encounter: Secondary | ICD-10-CM | POA: Diagnosis not present

## 2021-12-30 LAB — I-STAT CHEM 8, ED
BUN: 11 mg/dL (ref 8–23)
Calcium, Ion: 1.12 mmol/L — ABNORMAL LOW (ref 1.15–1.40)
Chloride: 103 mmol/L (ref 98–111)
Creatinine, Ser: 0.5 mg/dL (ref 0.44–1.00)
Glucose, Bld: 115 mg/dL — ABNORMAL HIGH (ref 70–99)
HCT: 37 % (ref 36.0–46.0)
Hemoglobin: 12.6 g/dL (ref 12.0–15.0)
Potassium: 4.1 mmol/L (ref 3.5–5.1)
Sodium: 139 mmol/L (ref 135–145)
TCO2: 24 mmol/L (ref 22–32)

## 2021-12-30 NOTE — Progress Notes (Signed)
Orthopedic Tech Progress Note Patient Details:  [name] 1955-11-30 [id number]  Patient ID: [name], female   DOB: January 09, 1956, 66 y.o.   MRN: [id number] Level II; not needed. [name] 12/30/2021, 11:21 PM

## 2021-12-30 NOTE — ED Triage Notes (Signed)
Pt BIB EMS from [facility]. Pt had mechanical fall out of bed. Hit head on ground, no LOC. On xarelto. EMS reports swelling to back of head.  Pt is contracted on left side due to prev stroke. Pt alert. VSS with EMS.

## 2021-12-30 NOTE — ED Provider Notes (Signed)
[facility] EMERGENCY DEPARTMENT Provider Note   CSN: [id number] Arrival date & time: 12/30/21  2307     History {Add pertinent medical, surgical, social history, OB history to HPI:1} Chief Complaint  Patient presents with   Level 2 Fall on Thinners     [name] is a 66 y.o. female.  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  Patient resides in a local nursing facility and was found in the floor.  She had reported headache after the fall.  No other reports of traumatic injury.  Patient's only complaint at this time is that she is thirsty. No other acute complaints at this time    Home Medications Prior to Admission medications   Medication Sig Start Date End Date Taking? Authorizing Provider  acetaminophen (TYLENOL) 325 MG tablet Take 650 mg by mouth See admin instructions. Take 650 mg by mouth two times a day and DO NOT EXCEED 3,000 MG IN HOURS    [provider]  ammonium lactate (LAC-HYDRIN) 12 % lotion Apply 1 application. topically every 30 (thirty) days.    [provider]  ARTIFICIAL TEAR SOLUTION OP Place 1 drop into both eyes 2 (two) times daily.    [provider]  atorvastatin (LIPITOR) 10 MG tablet Take 5 mg by mouth every evening.    [provider]  atorvastatin (LIPITOR) 20 MG tablet Take 1 tablet (20 mg total) by mouth daily. Patient not taking: Reported on 10/21/2021 11/15/17   Shon Hale, MD  bimatoprost (LUMIGAN) 0.01 % SOLN Place 1 drop into both eyes every evening.    [provider]  cephALEXin (KEFLEX) 500 MG capsule Take 1 capsule (500 mg total) by mouth 2 (two) times daily. Patient not taking: Reported on 10/21/2021 07/09/19   Roxy Horseman, PA-C  Cholecalciferol (VITAMIN D3) 10 MCG (400 UNIT) CAPS Take 800 Units by mouth every evening.    [provider]  cloNIDine (CATAPRES) 0.2 MG tablet Take 1 tablet (0.2 mg total) by mouth  3 (three) times daily. Patient taking differently: Take 0.2 mg by mouth See admin instructions. Take 0.2 mg by mouth three times a day and hold for a Systolic reading less than 110 11/25/17   Tyrone Nine, MD  Cranberry 450 MG TABS Take 450 mg by mouth 2 (two) times daily.    [provider]  ferrous sulfate 325 (65 FE) MG tablet Take 1 tablet (325 mg total) by mouth daily with breakfast. Patient taking differently: Take 325 mg by mouth daily with supper. 11/15/17   Shon Hale, MD  gabapentin (NEURONTIN) 300 MG capsule Take 1 capsule (300 mg total) by mouth every 12 (twelve) hours. Patient not taking: Reported on 10/21/2021 11/15/17   Shon Hale, MD  gabapentin (NEURONTIN) 400 MG capsule Take 400 mg by mouth 3 (three) times daily.    [provider]  hydrALAZINE (APRESOLINE) 25 MG tablet Take 1 tablet (25 mg total) by mouth 3 (three) times daily. Patient taking differently: Take 25 mg by mouth See admin instructions. Take 25 mg by mouth three times a day and hold of the Systolic reading is less than 010 11/25/17   Tyrone Nine, MD  HYDROcodone-acetaminophen (NORCO/VICODIN) 5-325 MG tablet Take 1-2 tablets by mouth every 6 (six) hours as needed for moderate pain. Patient not taking: Reported on 10/21/2021 11/25/17   Tyrone Nine, MD  metFORMIN (GLUCOPHAGE) 500 MG tablet Take 500 mg by  mouth daily with breakfast.    [provider]  metoprolol (TOPROL-XL) 200 MG 24 hr tablet Take 200 mg by mouth See admin instructions. Take 200 mg by mouth in the morning and hold if the heart rate is less than 60    [provider]  metoprolol succinate (TOPROL-XL) 100 MG 24 hr tablet Take 1 tablet (100 mg total) by mouth daily. Take with or immediately following a meal. Patient not taking: Reported on 10/21/2021 11/25/17   Tyrone Nine, MD  Rivaroxaban (XARELTO) 15 MG TABS tablet Take 1 tablet (15 mg total) by mouth 2 (two) times daily with a meal. Patient not taking: Reported on  10/21/2021 11/25/17   Tyrone Nine, MD  rivaroxaban (XARELTO) 20 MG TABS tablet Take 1 tablet (20 mg total) by mouth daily with supper. 12/16/17   Tyrone Nine, MD  sennosides-docusate sodium (SENOKOT-S) 8.6-50 MG tablet Take 2 tablets by mouth at bedtime.    [provider]  SYSTANE BALANCE 0.6 % SOLN Place 1 drop into both eyes 2 (two) times daily.    [provider]      Allergies    Patient has no known allergies.    Review of Systems   Review of Systems  Constitutional:  Negative for fever.    Physical Exam Updated Vital Signs BP 136/77   Pulse 66   Temp 97.9 F (36.6 C) (Oral)   Resp 10   Ht 1.575 m (5\' 2" )   Wt 77.1 kg   SpO2 100%   BMI 31.09 kg/m  Physical Exam CONSTITUTIONAL: Chronically ill-appearing, no acute distress HEAD: Questionable small hematoma to occiput.  No lacerations or other signs of trauma EYES: EOMI/PERRL ENMT: Mucous membranes moist ?  Old bruising noted around the right maxilla.  No tenderness. Face is stable without any obvious signs of acute trauma SPINE/BACK:entire spine nontender, no bruising/crepitance/stepoffs noted to spine CV: S1/S2 noted LUNGS: Lungs are clear to auscultation bilaterally, no apparent distress ABDOMEN: soft, nontender NEURO: Pt is awake/alert, no distress noted.  Patient able to move extremities but limited due to contractures EXTREMITIES: pulses normal/equal, chronic contractures are noted in the lower extremities.  No obvious deformities to extremities.  Pelvis is stable SKIN: warm, color normal  ED Results / Procedures / Treatments   Labs (all labs ordered are listed, but only abnormal results are displayed) Labs Reviewed  I-STAT CHEM 8, ED - Abnormal; Notable for the following components:      Result Value   Glucose, Bld 115 (*)    Calcium, Ion 1.12 (*)    All other components within normal limits    EKG EKG Interpretation  Date/Time:  Wednesday December 30 2021 23:18:39 EDT Ventricular Rate:   64 PR Interval:  58 QRS Duration: 93 QT Interval:  399 QTC Calculation: 412 R Axis:   28 Text Interpretation: Sinus rhythm Short PR interval Abnormal R-wave progression, early transition Confirmed by 10-28-1994 (Zadie Rhine) on 12/30/2021 11:24:28 PM  Radiology No results found.  Procedures Procedures  {Document cardiac monitor, telemetry assessment procedure when appropriate:1}  Medications Ordered in ED Medications - No data to display  ED Course/ Medical Decision Making/ A&P                           Medical Decision Making Amount and/or Complexity of Data Reviewed Radiology: ordered.   This patient presents to the ED for concern of fall and head injury, this involves  an extensive number of treatment options, and is a complaint that carries with it a high risk of complications and morbidity.  The differential diagnosis includes but is not limited to subdural hematoma, skull fracture, subarachnoid hemorrhage, cervical spine fracture  Comorbidities that complicate the patient evaluation: Patient's presentation is complicated by their history of multiple sclerosis, VTE on anticoagulation  Social Determinants of Health: Patient's  limited mobility   increases the complexity of managing their presentation  Additional history obtained: Additional history obtained from nursing home/care facility discussed with nurse at the facility who confirms patient was found in the floor and reported headache with a small lump on her head.  No other signs of acute trauma.  Patient is bedbound at baseline Records reviewed previous admission documents  Lab Tests: I Ordered, and personally interpreted labs.  The pertinent results include: Minimal elevation glucose  Imaging Studies ordered: I ordered imaging studies including CT scan head and C-spine   Chest x-ray and pelvic x-ray I independently visualized and interpreted imaging which showed *** I agree with the radiologist  interpretation  Cardiac Monitoring: The patient was maintained on a cardiac monitor.  I personally viewed and interpreted the cardiac monitor which showed an underlying rhythm of:  {cardiac monitor:26849}  Medicines ordered and prescription drug management: I ordered medication including ***  for ***  Reevaluation of the patient after these medicines showed that the patient    {resolved/improved/worsened:23923::"improved"}  Test Considered: Patient is low risk / negative by ***, therefore do not feel that *** is indicated.  Critical Interventions:  ***  Consultations Obtained: I requested consultation with the {consultation:26851}, and discussed  findings as well as pertinent plan - they recommend: ***  Reevaluation: After the interventions noted above, I reevaluated the patient and found that they have :{resolved/improved/worsened:23923::"improved"}  Complexity of problems addressed: Patient's presentation is most consistent with  {FGHW:29937}  Disposition: After consideration of the diagnostic results and the patient's response to treatment,  I feel that the patent would benefit from {disposition:26850}.     {Document critical care time when appropriate:1} {Document review of labs and clinical decision tools ie heart score, Chads2Vasc2 etc:1}  {Document your independent review of radiology images, and any outside records:1} {Document your discussion with family members, caretakers, and with consultants:1} {Document social determinants of health affecting pt's care:1} {Document your decision making why or why not admission, treatments were needed:1} Final Clinical Impression(s) / ED Diagnoses Final diagnoses:  None    Rx / DC Orders ED Discharge Orders     None

## 2021-12-30 NOTE — ED Notes (Signed)
Pt taken to CT with this RN.

## 2021-12-31 NOTE — ED Notes (Signed)
PTAR called for transport back to [facility]. 

## 2021-12-31 NOTE — ED Notes (Signed)
[facility] to transport pt back to [facility]

## 2022-02-26 ENCOUNTER — Other Ambulatory Visit: Payer: Self-pay

## 2022-02-26 ENCOUNTER — Emergency Department (HOSPITAL_COMMUNITY)
Admission: EM | Admit: 2022-02-26 | Discharge: 2022-02-26 | Disposition: A | Discharge status: Skilled Nursing Facility | Payer: BC Managed Care – PPO | Attending: Emergency Medicine | Admitting: Emergency Medicine

## 2022-02-26 ENCOUNTER — Encounter (HOSPITAL_COMMUNITY): Payer: Self-pay

## 2022-02-26 DIAGNOSIS — Z7984 Long term (current) use of oral hypoglycemic drugs: Secondary | ICD-10-CM | POA: Diagnosis not present

## 2022-02-26 DIAGNOSIS — F0390 Unspecified dementia without behavioral disturbance: Secondary | ICD-10-CM | POA: Insufficient documentation

## 2022-02-26 DIAGNOSIS — X58XXXA Exposure to other specified factors, initial encounter: Secondary | ICD-10-CM | POA: Insufficient documentation

## 2022-02-26 DIAGNOSIS — R600 Localized edema: Secondary | ICD-10-CM | POA: Diagnosis not present

## 2022-02-26 DIAGNOSIS — S0011XA Contusion of right eyelid and periocular area, initial encounter: Secondary | ICD-10-CM | POA: Insufficient documentation

## 2022-02-26 DIAGNOSIS — N3000 Acute cystitis without hematuria: Secondary | ICD-10-CM | POA: Insufficient documentation

## 2022-02-26 DIAGNOSIS — S0993XA Unspecified injury of face, initial encounter: Secondary | ICD-10-CM | POA: Diagnosis present

## 2022-02-26 DIAGNOSIS — Z7901 Long term (current) use of anticoagulants: Secondary | ICD-10-CM | POA: Diagnosis not present

## 2022-02-26 DIAGNOSIS — R4182 Altered mental status, unspecified: Secondary | ICD-10-CM | POA: Diagnosis not present

## 2022-02-26 LAB — URINALYSIS, ROUTINE W REFLEX MICROSCOPIC
Bilirubin Urine: NEGATIVE
Glucose, UA: NEGATIVE mg/dL
Ketones, ur: NEGATIVE mg/dL
Nitrite: POSITIVE — AB
Protein, ur: NEGATIVE mg/dL
Specific Gravity, Urine: 1.015 (ref 1.005–1.030)
WBC, UA: >50 WBC/hpf — ABNORMAL HIGH (ref 0–5)
pH: 5 (ref 5.0–8.0)

## 2022-02-26 LAB — BASIC METABOLIC PANEL
Anion gap: 9 (ref 5–15)
BUN: 12 mg/dL (ref 8–23)
CO2: 24 mmol/L (ref 22–32)
Calcium: 9.3 mg/dL (ref 8.9–10.3)
Chloride: 107 mmol/L (ref 98–111)
Creatinine, Ser: 0.72 mg/dL (ref 0.44–1.00)
GFR, Estimated: >60 mL/min (ref >60)
Glucose, Bld: 142 mg/dL — ABNORMAL HIGH (ref 70–99)
Potassium: 4 mmol/L (ref 3.5–5.1)
Sodium: 140 mmol/L (ref 135–145)

## 2022-02-26 LAB — CBC WITH DIFFERENTIAL/PLATELET
Abs Immature Granulocytes: 0.07 10*3/uL (ref 0.00–0.07)
Basophils Absolute: 0.1 10*3/uL (ref 0.0–0.1)
Basophils Relative: 1 %
Eosinophils Absolute: 0.3 10*3/uL (ref 0.0–0.5)
Eosinophils Relative: 5 %
HCT: 37.2 % (ref 36.0–46.0)
Hemoglobin: 12.1 g/dL (ref 12.0–15.0)
Immature Granulocytes: 1 %
Lymphocytes Relative: 30 %
Lymphs Abs: 1.9 10*3/uL (ref 0.7–4.0)
MCH: 26.6 pg (ref 26.0–34.0)
MCHC: 32.5 g/dL (ref 30.0–36.0)
MCV: 81.8 fL (ref 80.0–100.0)
Monocytes Absolute: 0.8 10*3/uL (ref 0.1–1.0)
Monocytes Relative: 12 %
Neutro Abs: 3.4 10*3/uL (ref 1.7–7.7)
Neutrophils Relative %: 51 %
Platelets: 254 10*3/uL (ref 150–400)
RBC: 4.55 MIL/uL (ref 3.87–5.11)
RDW: 14.9 % (ref 11.5–15.5)
WBC: 6.5 10*3/uL (ref 4.0–10.5)
nRBC: 0 % (ref 0.0–0.2)

## 2022-02-26 MED ORDER — CEPHALEXIN 500 MG PO CAPS: 500.0000 mg | ORAL_CAPSULE | Freq: Four times a day (QID) | ORAL | 0 refills | Status: DC

## 2022-02-26 MED ORDER — SODIUM CHLORIDE 0.9 % IV SOLN
1.0000 g | Freq: Once | INTRAVENOUS | Status: AC
  Administered 2022-02-26: 1 g via INTRAVENOUS
  Filled 2022-02-26: qty 10

## 2022-02-26 NOTE — ED Notes (Signed)
Pt's sister contacted and they were unaware that pt was sent to ED.

## 2022-02-26 NOTE — ED Notes (Signed)
[facility] arrived to transport pt. AVS and MOST form given to transport team to return to facility.

## 2022-02-26 NOTE — ED Provider Notes (Addendum)
[facility] EMERGENCY DEPARTMENT Provider Note   CSN: [id number] Arrival date & time: 02/26/22  1624     History  Chief Complaint  Patient presents with   Altered Mental Status    [name] is a 66 y.o. female.  Patient brought in by EMS from nursing facility.  Patient is at [facility] and rehab.  According to paperwork seems that she has been there since 2019.  Patient reported to have dementia they sent her in for altered mental status but patient is awake here and will talk to [place] denies any complaints.  Patient has resuscitated paperwork says that a try CPR but then just comfort measures.  No long-term resuscitative efforts.  Contacted patient's sister who is listed as a point of contact and she was not aware that the patient was sent in.  Patient does have some blueness around her right eye the patient's sister states that that is her skin color is been there always.  Does not reflect fall or injury.  Patient's vital signs here are very normal no fevers blood pressure little elevated at 141/110 pulse 82 respirations 20 patient is nonambulatory.       Home Medications Prior to Admission medications   Medication Sig Start Date End Date Taking? Authorizing Provider  cephALEXin (KEFLEX) 500 MG capsule Take 1 capsule (500 mg total) by mouth 4 (four) times daily. 02/26/22  Yes Vanetta Mulders, MD  acetaminophen (TYLENOL) 325 MG tablet Take 650 mg by mouth See admin instructions. Take 650 mg by mouth two times a day and DO NOT EXCEED 3,000 MG IN HOURS    [provider]  ammonium lactate (LAC-HYDRIN) 12 % lotion Apply 1 application. topically every 30 (thirty) days.    [provider]  ARTIFICIAL TEAR SOLUTION OP Place 1 drop into both eyes 2 (two) times daily.    [provider]  atorvastatin (LIPITOR) 10 MG tablet Take 5 mg by mouth every evening.    [provider]  atorvastatin (LIPITOR) 20 MG tablet Take 1 tablet (20 mg  total) by mouth daily. Patient not taking: Reported on 10/21/2021 11/15/17   Shon Hale, MD  bimatoprost (LUMIGAN) 0.01 % SOLN Place 1 drop into both eyes every evening.    [provider]  cephALEXin (KEFLEX) 500 MG capsule Take 1 capsule (500 mg total) by mouth 2 (two) times daily. Patient not taking: Reported on 10/21/2021 07/09/19   Roxy Horseman, PA-C  Cholecalciferol (VITAMIN D3) 10 MCG (400 UNIT) CAPS Take 800 Units by mouth every evening.    [provider]  cloNIDine (CATAPRES) 0.2 MG tablet Take 1 tablet (0.2 mg total) by mouth 3 (three) times daily. Patient taking differently: Take 0.2 mg by mouth See admin instructions. Take 0.2 mg by mouth three times a day and hold for a Systolic reading less than 110 11/25/17   Tyrone Nine, MD  Cranberry 450 MG TABS Take 450 mg by mouth 2 (two) times daily.    [provider]  ferrous sulfate 325 (65 FE) MG tablet Take 1 tablet (325 mg total) by mouth daily with breakfast. Patient taking differently: Take 325 mg by mouth daily with supper. 11/15/17   Shon Hale, MD  gabapentin (NEURONTIN) 300 MG capsule Take 1 capsule (300 mg total) by mouth every 12 (twelve) hours. Patient not taking: Reported on 12/30/2021 11/15/17   Shon Hale, MD  gabapentin (NEURONTIN) 400 MG capsule Take 400 mg by mouth 3 (three) times daily.  [provider]  hydrALAZINE (APRESOLINE) 25 MG tablet Take 1 tablet (25 mg total) by mouth 3 (three) times daily. Patient taking differently: Take 25 mg by mouth See admin instructions. Take 25 mg by mouth three times a day and hold of the Systolic reading is less than 110 11/25/17   Patrecia Pour, MD  HYDROcodone-acetaminophen (NORCO/VICODIN) 5-325 MG tablet Take 1-2 tablets by mouth every 6 (six) hours as needed for moderate pain. Patient not taking: Reported on 10/21/2021 11/25/17   Patrecia Pour, MD  metFORMIN (GLUCOPHAGE) 500 MG tablet Take 500 mg by mouth daily with breakfast.     [provider]  metoprolol (TOPROL-XL) 200 MG 24 hr tablet Take 200 mg by mouth See admin instructions. Take 200 mg by mouth in the morning and hold if the heart rate is less than 60    [provider]  metoprolol succinate (TOPROL-XL) 100 MG 24 hr tablet Take 1 tablet (100 mg total) by mouth daily. Take with or immediately following a meal. Patient not taking: Reported on 10/21/2021 11/25/17   Patrecia Pour, MD  Rivaroxaban (XARELTO) 15 MG TABS tablet Take 1 tablet (15 mg total) by mouth 2 (two) times daily with a meal. Patient not taking: Reported on 10/21/2021 11/25/17   Patrecia Pour, MD  rivaroxaban (XARELTO) 20 MG TABS tablet Take 1 tablet (20 mg total) by mouth daily with supper. 12/16/17   Patrecia Pour, MD  sennosides-docusate sodium (SENOKOT-S) 8.6-50 MG tablet Take 2 tablets by mouth at bedtime.    [provider]  SYSTANE BALANCE 0.6 % SOLN Place 1 drop into both eyes 2 (two) times daily.    [provider]      Allergies    Patient has no known allergies.    Review of Systems   Review of Systems  Unable to perform ROS: Dementia    Physical Exam Updated Vital Signs BP (!) 141/110 (BP Location: Right Arm)   Pulse 82   Temp 98.2 F (36.8 C) (Oral)   Resp 20   Ht 1.575 m (5\' 2" )   Wt 77.1 kg   SpO2 100%   BMI 31.09 kg/m  Physical Exam Vitals and nursing note reviewed.  Constitutional:      General: She is not in acute distress.    Appearance: Normal appearance. She is well-developed. She is not ill-appearing or toxic-appearing.  HENT:     Head: Normocephalic and atraumatic.     Comments: Some bruising around the right eye and cheek area family states that that is old.  Does not reflect a injury.  Just skin discoloration that she is always had.  Nontender to palpation.    Nose:     Comments: Oropharynx is moist does not appear to be dehydrated.    Mouth/Throat:     Mouth: Mucous membranes are moist.  Eyes:     Extraocular Movements:  Extraocular movements intact.     Conjunctiva/sclera: Conjunctivae normal.     Pupils: Pupils are equal, round, and reactive to light.  Cardiovascular:     Rate and Rhythm: Normal rate and regular rhythm.     Heart sounds: No murmur heard. Pulmonary:     Effort: Pulmonary effort is normal. No respiratory distress.     Breath sounds: Normal breath sounds.  Abdominal:     Palpations: Abdomen is soft.     Tenderness: There is no abdominal tenderness.  Musculoskeletal:        General: Swelling  present.     Cervical back: Neck supple.     Right lower leg: Edema present.     Left lower leg: Edema present.  Skin:    General: Skin is warm and dry.     Capillary Refill: Capillary refill takes less than 2 seconds.  Neurological:     Mental Status: She is alert.     Comments: Legs atrophied minimal movement.  Little bit of bilateral swelling to the feet but not the ankles.  Patient is awake will talk when asked questions.  Psychiatric:        Mood and Affect: Mood normal.     ED Results / Procedures / Treatments   Labs (all labs ordered are listed, but only abnormal results are displayed) Labs Reviewed  URINALYSIS, ROUTINE W REFLEX MICROSCOPIC - Abnormal; Notable for the following components:      Result Value   APPearance HAZY (*)    Hgb urine dipstick SMALL (*)    Nitrite POSITIVE (*)    Leukocytes,Ua SMALL (*)    WBC, UA >50 (*)    Bacteria, UA FEW (*)    All other components within normal limits  BASIC METABOLIC PANEL - Abnormal; Notable for the following components:   Glucose, Bld 142 (*)    All other components within normal limits  URINE CULTURE  CBC WITH DIFFERENTIAL/PLATELET    EKG None  Radiology No results found.  Procedures Procedures    Medications Ordered in ED Medications  cefTRIAXone (ROCEPHIN) 1 g in sodium chloride 0.9 % 100 mL IVPB (has no administration in time range)    ED Course/ Medical Decision Making/ A&P                            Medical Decision Making Amount and/or Complexity of Data Reviewed Labs: ordered.  Risk Prescription drug management.   Patient appears relatively stable vital signs very normal.  Certainly no history of fall reported.  Will check urinalysis CBC and basic metabolic panel.  Urinalysis consistent with urinary tract infection positive nitrite white blood cells are greater than 50 and there is bacteria.  No significant red blood cells.  CBC normal with a white count of 6.5 hemoglobin 12.1 basic metabolic panel glucose 142 BUN and creatinine normal normal renal function.  Electrolytes are normal.  Urine sent for culture, patient treated with 1 g Rocephin here would recommend Keflex to continue at the facility.  Follow-up urine culture.  Patient stable for discharge back to facility.   Final Clinical Impression(s) / ED Diagnoses Final diagnoses:  Dementia, unspecified dementia severity, unspecified dementia type, unspecified whether behavioral, psychotic, or mood disturbance or anxiety (HCC)  Acute cystitis without hematuria    Rx / DC Orders ED Discharge Orders          Ordered    cephALEXin (KEFLEX) 500 MG capsule  4 times daily        02/26/22 1904              Vanetta Mulders, MD 02/26/22 1706    Vanetta Mulders, MD 02/26/22 1907

## 2022-02-26 NOTE — Discharge Instructions (Signed)
Urinalysis consistent with urinary tract infection.  Otherwise no significant lab abnormalities.  Patient urine sent for culture.  Patient received 1 g of Rocephin here.  Would recommend continuing Keflex and following up the urine culture.  Patient stable for discharge back to nursing facility.

## 2022-02-26 NOTE — ED Triage Notes (Signed)
Pt BIB [facility] from [facility]. Pt presents with AMS. Pt with dementia but is not acting herself. No other complaints reported.   EMS Vitals 152/92 78 CBG 140

## 2022-03-01 LAB — URINE CULTURE: Culture: >=100000 — AB

## 2022-03-02 ENCOUNTER — Telehealth (HOSPITAL_BASED_OUTPATIENT_CLINIC_OR_DEPARTMENT_OTHER): Payer: Self-pay | Admitting: *Deleted

## 2022-03-02 NOTE — Telephone Encounter (Signed)
Post ED Visit - Positive Culture Follow-up  Culture report reviewed by antimicrobial stewardship pharmacist:  Pharmacy Team [] Nathan Batchelder, Pharm.D. [] Jeremy Frens, Pharm.D., BCPS AQ-ID [] Mike Maccia, Pharm.D., BCPS [] Elizabeth Martin, Pharm.D., BCPS [] Minh Pham, Pharm.D., BCPS, AAHIVP [] Michelle Turner, Pharm.D., BCPS, AAHIVP [] Rachel Rumbarger, PharmD, BCPS [] Thuy Dang, PharmD, BCPS [] Alison Masters, PharmD, BCPS [] Erin Deja, PharmD [] Catherine Pierce, PharmD, BCPS [] Benjamin Mancheril, PharmD  [facility] [] Michelle Bell, PharmD [] Jigna Gadhia, PharmD [] Nikola Glogovac, PharmD [] Terri Green, Rph [] Rachel (Ellen) Jackson, PharmD [] Justin Legge, PharmD [] Michelle Lilliston, PharmD [] Anh Pham, PharmD [] Leann Poindexter, PharmD [] Christine Shade, PharmD [] Mary Swayne, PharmD [] Erin Williamson, PharmD [] Drew Wofford, PharmD   Positive urine culture Treated with Cephalexin, organism sensitive to the same and no further patient follow-up is required at this time.  Emily Steinbock, Pharm D  [name] 03/02/2022, 10:23 AM   

## 2022-03-12 ENCOUNTER — Non-Acute Institutional Stay: Payer: Medicare Other | Admitting: Student

## 2022-03-12 DIAGNOSIS — G35 Multiple sclerosis: Secondary | ICD-10-CM

## 2022-03-12 DIAGNOSIS — Z515 Encounter for palliative care: Secondary | ICD-10-CM

## 2022-03-12 DIAGNOSIS — R52 Pain, unspecified: Secondary | ICD-10-CM

## 2022-03-12 NOTE — Progress Notes (Unsigned)
[facility] Consult Note Telephone: [phone]  Fax: [phone]   Date of encounter: 03/12/22 4:09 PM PATIENT NAME: [name] [address] [place] [address]   [phone] (home)  DOB: 02/27/1956 MRN: [id number] PRIMARY CARE PROVIDER:    Reymundo Poll, MD,  [address]. [name] [place] [address] [phone]  REFERRING PROVIDER:   Reymundo Poll, MD [address]. [name] [place],  [place] [address] [phone]  RESPONSIBLE PARTY:    Contact Information     Name Relation Home Work [place] Sister [phone]  [phone]   [name] [phone]     [name] Niece [phone]          I met face to face with patient in the facility. Palliative Care was asked to follow this patient by consultation request of Claudia Brigham, MD to address advance care planning and complex medical decision making. This is the initial visit.                                     ASSESSMENT AND PLAN / RECOMMENDATIONS:   Advance Care Planning/Goals of Care: Goals include to maximize quality of life and symptom management. Patient/health care surrogate gave his/her permission to discuss.Our advance care planning conversation included a discussion about:    The value and importance of advance care planning  Experiences with loved ones who have been seriously ill or have died  Exploration of personal, cultural or spiritual beliefs that might influence medical decisions  Exploration of goals of care in the event of a sudden injury or illness  Identification  of a healthcare agent  Review and updating or creation of an  advance directive document  Decision not to resuscitate or to de-escalate disease focused treatments due to poor prognosis. CODE STATUS: Full Code   Education provided on Palliative Medicine vs. Hospice services. She expresses comfort  for sister. She is open to hospitalization, IV fluids, antibiotics as needed. Will provide symptom management as needed, monitor for changes and declines. Facility SW notified with request of information for HCPOA for sister [name] and request for haircut. We discussed her code status. [name] states this is what patient wanted when she moved to facility several years ago. She is going to check with patient's son [name] to see if this is what he still wants for patient.   Symptom Management/Plan:    Follow up Palliative Care Visit: Palliative care will continue to follow for complex medical decision making, advance care planning, and clarification of goals. Return *** weeks or prn.  I spent *** minutes providing this consultation. More than 50% of the time in this consultation was spent in counseling and care coordination.  This visit was coded based on medical decision making (MDM).***  PPS: 30%  HOSPICE ELIGIBILITY/DIAGNOSIS: TBD  Chief Complaint: Palliative Medicine initial consult.   HISTORY OF PRESENT ILLNESS:  [name] is a 66 y.o. year old female  with MS, .   Sister states she was not as talkative as when she last saw her in the Spring. She has had skin breakdown in the past, but this is currently healed. She has one son, [name].   History obtained from review of EMR, discussion with primary team, and interview with family, facility staff/caregiver and/or [name].  I reviewed available labs, medications, imaging,  studies and related documents from the EMR.  Records reviewed and summarized above.   ROS  *** General: NAD EYES: denies vision changes ENMT: denies dysphagia Cardiovascular: denies chest pain, denies DOE Pulmonary: denies cough, denies increased SOB Abdomen: endorses good appetite, denies constipation, endorses continence of bowel GU: denies dysuria, endorses continence of urine MSK:  denies increased weakness,  no falls reported Skin: denies  rashes or wounds Neurological: denies pain, denies insomnia Psych: Endorses positive mood Heme/lymph/immuno: denies bruises, abnormal bleeding  Physical Exam: Current and past weights: Constitutional: NAD General: frail appearing, thin/WNWD/obese  EYES: anicteric sclera, lids intact, no discharge  ENMT: intact hearing, oral mucous membranes moist, dentition intact CV: S1S2, RRR, no LE edema Pulmonary: LCTA, no increased work of breathing, no cough, room air Abdomen: intake 100%, normo-active BS + 4 quadrants, soft and non tender, no ascites GU: deferred MSK: no sarcopenia, moves all extremities, ambulatory Skin: warm and dry, no rashes or wounds on visible skin Neuro:  no generalized weakness,  no cognitive impairment Psych: non-anxious affect, A and O x 3 Hem/lymph/immuno: no widespread bruising CURRENT PROBLEM LIST:  Patient Active Problem List   Diagnosis Date Noted   Leukocytosis 11/17/2017   Sepsis ([facility]) 11/17/2017   DVT (deep venous thrombosis) Rt Leg 11/14/2017   Pulmonary embolism ([facility]) 11/13/2017   Pulmonary emboli ([facility]) 11/13/2017   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   Cerebrovascular disease 01/17/2015   Encephalopathy, metabolic 85/46/2703   Pyelonephritis, acute 12/29/2011   Hypokalemia 12/29/2011   Depression    MS (multiple sclerosis) ([facility]) 12/28/2011   Fever 12/28/2011   DM2 (diabetes mellitus, type 2) ([facility]) 12/28/2011   PAST MEDICAL HISTORY:  Active Ambulatory Problems    Diagnosis Date Noted   MS (multiple sclerosis) ([facility]) 12/28/2011   Fever 12/28/2011   DM2 (diabetes mellitus, type 2) ([facility]) 12/28/2011   Encephalopathy, metabolic 50/01/3817   Depression    Pyelonephritis, acute 12/29/2011   Hypokalemia 12/29/2011   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   Cerebrovascular disease 01/17/2015   Pulmonary embolism ([facility]) 11/13/2017   Pulmonary emboli ([facility]) 11/13/2017   DVT (deep venous thrombosis) Rt Leg  11/14/2017   Leukocytosis 11/17/2017   Sepsis ([facility]) 11/17/2017   Resolved Ambulatory Problems    Diagnosis Date Noted   Mental status, decreased 12/28/2011   Past Medical History:  Diagnosis Date   Allergic rhinitis    Blood dyscrasia    Dementia (H[facility])    Diabetes mellitus    Dysmetabolic syndrome X    Family history of anesthesia complication    Hypertension    Multiple sclerosis ([facility])    Neuropathic pain    Urinary incontinence    Vitamin D deficiency    SOCIAL HX:  Social History   Tobacco Use   Smoking status: Never   Smokeless tobacco: Never  Substance Use Topics   Alcohol use: Yes    Alcohol/week: 0.0 standard drinks of alcohol    Comment: former    FAMILY HX:  Family History  Problem Relation Age of Onset   Multiple sclerosis Brother       ALLERGIES: No Known Allergies   PERTINENT MEDICATIONS:  Outpatient Encounter Medications as of 03/12/2022  Medication Sig   acetaminophen (TYLENOL) 325 MG tablet Take 650 mg by mouth See admin instructions. Take 650 mg by mouth two times a day and DO NOT EXCEED 3,000 MG IN HOURS   ammonium lactate (LAC-HYDRIN) 12 % lotion Apply 1 application. topically every 30 (thirty) days.  ARTIFICIAL TEAR SOLUTION OP Place 1 drop into both eyes 2 (two) times daily.   atorvastatin (LIPITOR) 10 MG tablet Take 5 mg by mouth every evening.   atorvastatin (LIPITOR) 20 MG tablet Take 1 tablet (20 mg total) by mouth daily. (Patient not taking: Reported on 10/21/2021)   bimatoprost (LUMIGAN) 0.01 % SOLN Place 1 drop into both eyes every evening.   cephALEXin (KEFLEX) 500 MG capsule Take 1 capsule (500 mg total) by mouth 2 (two) times daily. (Patient not taking: Reported on 10/21/2021)   cephALEXin (KEFLEX) 500 MG capsule Take 1 capsule (500 mg total) by mouth 4 (four) times daily.   Cholecalciferol (VITAMIN D3) 10 MCG (400 UNIT) CAPS Take 800 Units by mouth every evening.   cloNIDine (CATAPRES) 0.2 MG tablet Take 1 tablet (0.2 mg total) by mouth  3 (three) times daily. (Patient taking differently: Take 0.2 mg by mouth See admin instructions. Take 0.2 mg by mouth three times a day and hold for a Systolic reading less than 110)   Cranberry 450 MG TABS Take 450 mg by mouth 2 (two) times daily.   ferrous sulfate 325 (65 FE) MG tablet Take 1 tablet (325 mg total) by mouth daily with breakfast. (Patient taking differently: Take 325 mg by mouth daily with supper.)   gabapentin (NEURONTIN) 300 MG capsule Take 1 capsule (300 mg total) by mouth every 12 (twelve) hours. (Patient not taking: Reported on 12/30/2021)   gabapentin (NEURONTIN) 400 MG capsule Take 400 mg by mouth 3 (three) times daily.   hydrALAZINE (APRESOLINE) 25 MG tablet Take 1 tablet (25 mg total) by mouth 3 (three) times daily. (Patient taking differently: Take 25 mg by mouth See admin instructions. Take 25 mg by mouth three times a day and hold of the Systolic reading is less than 110)   HYDROcodone-acetaminophen (NORCO/VICODIN) 5-325 MG tablet Take 1-2 tablets by mouth every 6 (six) hours as needed for moderate pain. (Patient not taking: Reported on 10/21/2021)   metFORMIN (GLUCOPHAGE) 500 MG tablet Take 500 mg by mouth daily with breakfast.   metoprolol (TOPROL-XL) 200 MG 24 hr tablet Take 200 mg by mouth See admin instructions. Take 200 mg by mouth in the morning and hold if the heart rate is less than 60   metoprolol succinate (TOPROL-XL) 100 MG 24 hr tablet Take 1 tablet (100 mg total) by mouth daily. Take with or immediately following a meal. (Patient not taking: Reported on 10/21/2021)   Rivaroxaban (XARELTO) 15 MG TABS tablet Take 1 tablet (15 mg total) by mouth 2 (two) times daily with a meal. (Patient not taking: Reported on 10/21/2021)   rivaroxaban (XARELTO) 20 MG TABS tablet Take 1 tablet (20 mg total) by mouth daily with supper.   sennosides-docusate sodium (SENOKOT-S) 8.6-50 MG tablet Take 2 tablets by mouth at bedtime.   SYSTANE BALANCE 0.6 % SOLN Place 1 drop into both eyes 2  (two) times daily.   No facility-administered encounter medications on file as of 03/12/2022.   Thank you for the opportunity to participate in the care of [name].  The palliative care team will continue to follow. Please call our office at [phone] if we can be of additional assistance.   Ezekiel Slocumb, NP  COVID-19 PATIENT SCREENING TOOL Asked and negative response unless otherwise noted:  Have you had symptoms of covid, tested positive or been in contact with someone with symptoms/positive test in the past 5-10 days? No

## 2022-04-15 ENCOUNTER — Other Ambulatory Visit: Payer: Self-pay

## 2022-04-15 ENCOUNTER — Encounter (HOSPITAL_COMMUNITY): Payer: Self-pay

## 2022-04-15 ENCOUNTER — Emergency Department (HOSPITAL_COMMUNITY): Payer: BC Managed Care – PPO

## 2022-04-15 ENCOUNTER — Emergency Department (HOSPITAL_COMMUNITY)
Admission: EM | Admit: 2022-04-15 | Discharge: 2022-04-15 | Disposition: A | Discharge status: Skilled Nursing Facility | Payer: BC Managed Care – PPO | Attending: Emergency Medicine | Admitting: Emergency Medicine

## 2022-04-15 DIAGNOSIS — S0990XA Unspecified injury of head, initial encounter: Secondary | ICD-10-CM | POA: Diagnosis present

## 2022-04-15 DIAGNOSIS — S3991XA Unspecified injury of abdomen, initial encounter: Secondary | ICD-10-CM | POA: Insufficient documentation

## 2022-04-15 DIAGNOSIS — Z86718 Personal history of other venous thrombosis and embolism: Secondary | ICD-10-CM | POA: Diagnosis not present

## 2022-04-15 DIAGNOSIS — E119 Type 2 diabetes mellitus without complications: Secondary | ICD-10-CM | POA: Insufficient documentation

## 2022-04-15 DIAGNOSIS — F0390 Unspecified dementia without behavioral disturbance: Secondary | ICD-10-CM | POA: Insufficient documentation

## 2022-04-15 DIAGNOSIS — W06XXXA Fall from bed, initial encounter: Secondary | ICD-10-CM | POA: Diagnosis not present

## 2022-04-15 DIAGNOSIS — Z7901 Long term (current) use of anticoagulants: Secondary | ICD-10-CM | POA: Diagnosis not present

## 2022-04-15 DIAGNOSIS — S299XXA Unspecified injury of thorax, initial encounter: Secondary | ICD-10-CM | POA: Insufficient documentation

## 2022-04-15 DIAGNOSIS — Z7984 Long term (current) use of oral hypoglycemic drugs: Secondary | ICD-10-CM | POA: Diagnosis not present

## 2022-04-15 DIAGNOSIS — W19XXXA Unspecified fall, initial encounter: Secondary | ICD-10-CM

## 2022-04-15 DIAGNOSIS — Y92129 Unspecified place in nursing home as the place of occurrence of the external cause: Secondary | ICD-10-CM | POA: Diagnosis not present

## 2022-04-15 LAB — COMPREHENSIVE METABOLIC PANEL
ALT: 27 U/L (ref 0–44)
AST: 20 U/L (ref 15–41)
Albumin: 3.4 g/dL — ABNORMAL LOW (ref 3.5–5.0)
Alkaline Phosphatase: 53 U/L (ref 38–126)
Anion gap: 5 (ref 5–15)
BUN: 14 mg/dL (ref 8–23)
CO2: 24 mmol/L (ref 22–32)
Calcium: 9.2 mg/dL (ref 8.9–10.3)
Chloride: 109 mmol/L (ref 98–111)
Creatinine, Ser: 0.68 mg/dL (ref 0.44–1.00)
GFR, Estimated: >60 mL/min (ref >60)
Glucose, Bld: 101 mg/dL — ABNORMAL HIGH (ref 70–99)
Potassium: 4.2 mmol/L (ref 3.5–5.1)
Sodium: 138 mmol/L (ref 135–145)
Total Bilirubin: 0.5 mg/dL (ref 0.3–1.2)
Total Protein: 6.8 g/dL (ref 6.5–8.1)

## 2022-04-15 LAB — I-STAT CHEM 8, ED
BUN: 14 mg/dL (ref 8–23)
Calcium, Ion: 1.21 mmol/L (ref 1.15–1.40)
Chloride: 106 mmol/L (ref 98–111)
Creatinine, Ser: 0.6 mg/dL (ref 0.44–1.00)
Glucose, Bld: 100 mg/dL — ABNORMAL HIGH (ref 70–99)
HCT: 35 % — ABNORMAL LOW (ref 36.0–46.0)
Hemoglobin: 11.9 g/dL — ABNORMAL LOW (ref 12.0–15.0)
Potassium: 4.2 mmol/L (ref 3.5–5.1)
Sodium: 139 mmol/L (ref 135–145)
TCO2: 22 mmol/L (ref 22–32)

## 2022-04-15 LAB — CBC
HCT: 35 % — ABNORMAL LOW (ref 36.0–46.0)
Hemoglobin: 11.5 g/dL — ABNORMAL LOW (ref 12.0–15.0)
MCH: 27.3 pg (ref 26.0–34.0)
MCHC: 32.9 g/dL (ref 30.0–36.0)
MCV: 82.9 fL (ref 80.0–100.0)
Platelets: 259 10*3/uL (ref 150–400)
RBC: 4.22 MIL/uL (ref 3.87–5.11)
RDW: 14.2 % (ref 11.5–15.5)
WBC: 8.8 10*3/uL (ref 4.0–10.5)
nRBC: 0 % (ref 0.0–0.2)

## 2022-04-15 LAB — PROTIME-INR
INR: 1.2 (ref 0.8–1.2)
Prothrombin Time: 14.8 seconds (ref 11.4–15.2)

## 2022-04-15 LAB — SAMPLE TO BLOOD BANK

## 2022-04-15 MED ORDER — IOHEXOL 350 MG/ML SOLN
75.0000 mL | Freq: Once | INTRAVENOUS | Status: AC | PRN
  Administered 2022-04-15: 75 mL via INTRAVENOUS

## 2022-04-15 NOTE — Discharge Instructions (Signed)
You were seen today after a fall out of bed.  We did CT scans of your head, neck, chest, abdomen, pelvis.  There were no acute injuries to any of these areas.  Please see the attached information for fall prevention.  Please follow-up with your PCP.  If you have any concerns that you are having an emergency, please return to the emergency department.

## 2022-04-15 NOTE — ED Notes (Signed)
Report given to Maish Vaya, Charity fundraiser.

## 2022-04-15 NOTE — ED Notes (Signed)
C-collar removed per MD instruction.

## 2022-04-15 NOTE — Progress Notes (Signed)
Responded to page to provide support. Chaplain will follow as needed.

## 2022-04-15 NOTE — ED Provider Notes (Signed)
[facility] EMERGENCY DEPARTMENT Provider Note   CSN: [id number] Arrival date & time: 04/15/22  1159     History  Chief Complaint  Patient presents with   Fall    Fall on thinners. Per ems pt rolled from bed approx 6 inches from floor onto right side.     [name] is a 66 y.o. female.  With past medical history significant for dementia, MS (bedbound at baseline), prior DVT and PE on Xarelto, DMT2, presenting as a level 2 trauma after a fall from bed.  Per EMS, patient was lying in bed and reportedly fell out to the ground.  They state that her bed is approximately 6 inches from the ground.  Patient is on blood thinning medications.  She is unsure if she lost consciousness.  She endorses diffuse pain.      Home Medications Prior to Admission medications   Medication Sig Start Date End Date Taking? Authorizing Provider  acetaminophen (TYLENOL) 325 MG tablet Take 650 mg by mouth See admin instructions. Take 650 mg by mouth two times a day and DO NOT EXCEED 3,000 MG IN HOURS    [provider]  ammonium lactate (LAC-HYDRIN) 12 % lotion Apply 1 application. topically every 30 (thirty) days.    [provider]  ARTIFICIAL TEAR SOLUTION OP Place 1 drop into both eyes 2 (two) times daily.    [provider]  atorvastatin (LIPITOR) 10 MG tablet Take 5 mg by mouth every evening.    [provider]  atorvastatin (LIPITOR) 20 MG tablet Take 1 tablet (20 mg total) by mouth daily. Patient not taking: Reported on 10/21/2021 11/15/17   Shon Hale, MD  bimatoprost (LUMIGAN) 0.01 % SOLN Place 1 drop into both eyes every evening.    [provider]  cephALEXin (KEFLEX) 500 MG capsule Take 1 capsule (500 mg total) by mouth 2 (two) times daily. Patient not taking: Reported on 10/21/2021 07/09/19   Roxy Horseman, PA-C  cephALEXin (KEFLEX) 500 MG capsule Take 1 capsule (500 mg total) by mouth 4 (four) times daily. 02/26/22   Vanetta Mulders, MD  Cholecalciferol (VITAMIN D3) 10 MCG (400 UNIT) CAPS Take 800 Units by mouth every evening.    [provider]  cloNIDine (CATAPRES) 0.2 MG tablet Take 1 tablet (0.2 mg total) by mouth 3 (three) times daily. Patient taking differently: Take 0.2 mg by mouth See admin instructions. Take 0.2 mg by mouth three times a day and hold for a Systolic reading less than 110 11/25/17   Tyrone Nine, MD  Cranberry 450 MG TABS Take 450 mg by mouth 2 (two) times daily.    [provider]  ferrous sulfate 325 (65 FE) MG tablet Take 1 tablet (325 mg total) by mouth daily with breakfast. Patient taking differently: Take 325 mg by mouth daily with supper. 11/15/17   Shon Hale, MD  gabapentin (NEURONTIN) 300 MG capsule Take 1 capsule (300 mg total) by mouth every 12 (twelve) hours. Patient not taking: Reported on 12/30/2021 11/15/17   Shon Hale, MD  gabapentin (NEURONTIN) 400 MG capsule Take 400 mg by mouth 3 (three) times daily.    [provider]  hydrALAZINE (APRESOLINE) 25 MG tablet Take 1 tablet (25 mg total) by mouth 3 (three) times daily. Patient taking differently: Take 25 mg by mouth See admin instructions. Take 25 mg by mouth three times a day and hold of the Systolic reading is less than 762 11/25/17  Tyrone Nine, MD  HYDROcodone-acetaminophen (NORCO/VICODIN) 5-325 MG tablet Take 1-2 tablets by mouth every 6 (six) hours as needed for moderate pain. Patient not taking: Reported on 10/21/2021 11/25/17   Tyrone Nine, MD  metFORMIN (GLUCOPHAGE) 500 MG tablet Take 500 mg by mouth daily with breakfast.    [provider]  metoprolol (TOPROL-XL) 200 MG 24 hr tablet Take 200 mg by mouth See admin instructions. Take 200 mg by mouth in the morning and hold if the heart rate is less than 60    [provider]  metoprolol succinate (TOPROL-XL) 100 MG 24 hr tablet Take 1 tablet (100 mg total) by mouth daily. Take with or immediately following a  meal. Patient not taking: Reported on 10/21/2021 11/25/17   Tyrone Nine, MD  metoprolol tartrate (LOPRESSOR) 100 MG tablet Take 100 mg by mouth 2 (two) times daily. 03/15/22   [provider]  Rivaroxaban (XARELTO) 15 MG TABS tablet Take 1 tablet (15 mg total) by mouth 2 (two) times daily with a meal. Patient not taking: Reported on 10/21/2021 11/25/17   Tyrone Nine, MD  rivaroxaban (XARELTO) 20 MG TABS tablet Take 1 tablet (20 mg total) by mouth daily with supper. 12/16/17   Tyrone Nine, MD  sennosides-docusate sodium (SENOKOT-S) 8.6-50 MG tablet Take 2 tablets by mouth at bedtime.    [provider]  SYSTANE BALANCE 0.6 % SOLN Place 1 drop into both eyes 2 (two) times daily.    [provider]      Allergies    Patient has no known allergies.    Review of Systems   Review of Systems  Physical Exam Updated Vital Signs BP (!) 123/91   Pulse 77   Temp 97.8 F (36.6 C) (Oral)   Resp 14   Ht 5\' 1"  (1.549 m)   Wt 77 kg   SpO2 100%   BMI 32.07 kg/m  Physical Exam HENT:     Head: Normocephalic.     Comments: Dark discoloration around right eye and cheek-possibly bruising.  It is not tender to palpation, is not swollen, there is no underlying crepitus.  Midface is stable.  Nasal bridge is stable.  Mouth is atraumatic.    Mouth/Throat:     Mouth: Mucous membranes are moist.     Pharynx: Oropharynx is clear. No oropharyngeal exudate or posterior oropharyngeal erythema.  Eyes:     Extraocular Movements: Extraocular movements intact.     Pupils: Pupils are equal, round, and reactive to light.  Neck:     Comments: C-collar in place.  Patient has tenderness to palpation on C-spine.  No deformities or step-offs. Cardiovascular:     Rate and Rhythm: Normal rate and regular rhythm.     Pulses: Normal pulses.     Heart sounds: Normal heart sounds.  Pulmonary:     Effort: Pulmonary effort is normal.     Breath sounds: Normal breath sounds.  Abdominal:      General: Bowel sounds are normal. There is no distension.     Tenderness: There is no abdominal tenderness. There is no guarding or rebound.     Comments: No contusions or abrasions.  Musculoskeletal:     Comments: Chest wall is atraumatic.  Patient is tender to palpation along the T/L-spine.  There are no deformities or step-offs.  No obvious injury visualized.  Extremities are atraumatic.  She has decreased tone in her lower extremities consistent with her bedbound status.  She has  decreased tone in her upper extremities.  Patient has history of MS, decreased mobility at baseline.  Neurological:     Mental Status: Mental status is at baseline.     ED Results / Procedures / Treatments   Labs (all labs ordered are listed, but only abnormal results are displayed) Labs Reviewed  COMPREHENSIVE METABOLIC PANEL - Abnormal; Notable for the following components:      Result Value   Glucose, Bld 101 (*)    Albumin 3.4 (*)    All other components within normal limits  CBC - Abnormal; Notable for the following components:   Hemoglobin 11.5 (*)    HCT 35.0 (*)    All other components within normal limits  I-STAT CHEM 8, ED - Abnormal; Notable for the following components:   Glucose, Bld 100 (*)    Hemoglobin 11.9 (*)    HCT 35.0 (*)    All other components within normal limits  PROTIME-INR  ETHANOL  URINALYSIS, ROUTINE W REFLEX MICROSCOPIC  SAMPLE TO BLOOD BANK    EKG None  Radiology CT CHEST ABDOMEN PELVIS W CONTRAST  Result Date: 04/15/2022 CLINICAL DATA:  Trauma EXAM: CT CHEST, ABDOMEN, AND PELVIS WITH CONTRAST TECHNIQUE: Multidetector CT imaging of the chest, abdomen and pelvis was performed following the standard protocol during bolus administration of intravenous contrast. RADIATION DOSE REDUCTION: This exam was performed according to the departmental dose-optimization program which includes automated exposure control, adjustment of the mA and/or kV according to patient size  and/or use of iterative reconstruction technique. CONTRAST:  75 mL OMNIPAQUE IOHEXOL 350 MG/ML SOLN COMPARISON:  11/17/2017 FINDINGS: CT CHEST FINDINGS Cardiovascular: Atheromatous calcifications aorta and coronary arteries. Normal heart size. No pericardial effusion. Mediastinum/Nodes: No enlarged mediastinal, hilar, or axillary lymph nodes. Thyroid gland, trachea, and esophagus demonstrate no significant findings. Lungs/Pleura: Left hemidiaphragm elevated with atelectasis or scarring at the left base above the diaphragm. Minimal subsegmental atelectasis identified right base. Lungs are otherwise clear. Normal pulmonary vasculature. Musculoskeletal: No chest wall mass or suspicious bone lesions identified. No traumatic osseous abnormalities identified. CT ABDOMEN PELVIS FINDINGS Hepatobiliary: No hepatic injury or perihepatic hematoma. Gallbladder is unremarkable. No biliary ductal dilatation. No hepatic parenchymal lesions. Pancreas: Unremarkable. No pancreatic ductal dilatation or surrounding inflammatory changes. Spleen: No splenic injury or perisplenic hematoma. Adrenals/Urinary Tract: No adrenal hemorrhage or renal injury identified. Bladder is unremarkable. Stomach/Bowel: Stomach is within normal limits. Appendix is not seen and there is no evidence for appendicitis. No bowel dilatation. No mesenteric inflammatory changes. Note is made of thickening of the wall of the rectum and sigmoid. Mucosal lesions can not be excluded. Clinical follow-up and management recommended. Vascular/Lymphatic: Aortic atherosclerosis. No enlarged abdominal or pelvic lymph nodes. Reproductive: Status post hysterectomy. No adnexal masses. Other: No abdominal wall hernia or abnormality. No abdominopelvic ascites. Musculoskeletal: No fracture is seen. IMPRESSION: 1. No acute traumatic abnormalities of the chest, abdomen and pelvis. 2. Rectosigmoid nonspecific mucosal thickening that should be followed up on a clinical basis to exclude  mucosal process. Electronically Signed   By: Layla Maw M.D.   On: 04/15/2022 13:24   CT HEAD WO CONTRAST  Result Date: 04/15/2022 CLINICAL DATA:  Head trauma, fall.  On blood thinner. EXAM: CT HEAD WITHOUT CONTRAST CT CERVICAL SPINE WITHOUT CONTRAST TECHNIQUE: Multidetector CT imaging of the head and cervical spine was performed following the standard protocol without intravenous contrast. Multiplanar CT image reconstructions of the cervical spine were also generated. RADIATION DOSE REDUCTION: This exam was performed according to the departmental  dose-optimization program which includes automated exposure control, adjustment of the mA and/or kV according to patient size and/or use of iterative reconstruction technique. COMPARISON:  CT head 12/30/2021 FINDINGS: CT HEAD FINDINGS Brain: Moderate to advanced atrophy. Moderate white matter hypodensity bilaterally appears chronic and unchanged. Negative for acute infarct, hemorrhage, mass Vascular: Negative for hyperdense vessel Skull: Negative Sinuses/Orbits: Air-fluid level sphenoid sinus. Remaining sinuses clear. Negative orbit Other: Mild contusion left parietal scalp. CT CERVICAL SPINE FINDINGS Alignment: Normal Skull base and vertebrae: Negative for fracture Soft tissues and spinal canal: Hypoplastic left lobe of the thyroid question thyroidectomy. No nodule on the right. No enlarged lymph node in the neck. Disc levels: Mild disc degeneration and spurring C5-6 and C6-7. No significant stenosis. Upper chest: Lung apices clear bilaterally Other: None IMPRESSION: No acute intracranial abnormality. Atrophy and chronic microvascular ischemic change in the white matter. Negative for cervical spine fracture. Electronically Signed   By: Marlan Palau M.D.   On: 04/15/2022 13:16   CT CERVICAL SPINE WO CONTRAST  Result Date: 04/15/2022 CLINICAL DATA:  Head trauma, fall.  On blood thinner. EXAM: CT HEAD WITHOUT CONTRAST CT CERVICAL SPINE WITHOUT CONTRAST  TECHNIQUE: Multidetector CT imaging of the head and cervical spine was performed following the standard protocol without intravenous contrast. Multiplanar CT image reconstructions of the cervical spine were also generated. RADIATION DOSE REDUCTION: This exam was performed according to the departmental dose-optimization program which includes automated exposure control, adjustment of the mA and/or kV according to patient size and/or use of iterative reconstruction technique. COMPARISON:  CT head 12/30/2021 FINDINGS: CT HEAD FINDINGS Brain: Moderate to advanced atrophy. Moderate white matter hypodensity bilaterally appears chronic and unchanged. Negative for acute infarct, hemorrhage, mass Vascular: Negative for hyperdense vessel Skull: Negative Sinuses/Orbits: Air-fluid level sphenoid sinus. Remaining sinuses clear. Negative orbit Other: Mild contusion left parietal scalp. CT CERVICAL SPINE FINDINGS Alignment: Normal Skull base and vertebrae: Negative for fracture Soft tissues and spinal canal: Hypoplastic left lobe of the thyroid question thyroidectomy. No nodule on the right. No enlarged lymph node in the neck. Disc levels: Mild disc degeneration and spurring C5-6 and C6-7. No significant stenosis. Upper chest: Lung apices clear bilaterally Other: None IMPRESSION: No acute intracranial abnormality. Atrophy and chronic microvascular ischemic change in the white matter. Negative for cervical spine fracture. Electronically Signed   By: Marlan Palau M.D.   On: 04/15/2022 13:16   DG Pelvis Portable  Result Date: 04/15/2022 CLINICAL DATA:  Trauma.  Fall EXAM: PORTABLE PELVIS 1-2 VIEWS COMPARISON:  12/30/2021 FINDINGS: Nonstandard patient positioning limiting assessment of the right hip. There is no evidence of pelvic fracture or diastasis. Similar degenerative changes. No pelvic bone lesions are seen. IMPRESSION: 1. No acute fracture or diastasis. 2. Nonstandard patient positioning limits assessment of the right  hip. Dedicated right hip radiographs could be obtained if there is any clinical suspicion for hip fracture. Electronically Signed   By: Duanne Guess D.O.   On: 04/15/2022 12:32   DG Chest Port 1 View  Result Date: 04/15/2022 CLINICAL DATA:  Fall EXAM: PORTABLE CHEST 1 VIEW COMPARISON:  12/30/2021 FINDINGS: The heart size and mediastinal contours are within normal limits. Persistent elevation of the left hemidiaphragm. Lungs appear clear. No pneumothorax. The visualized skeletal structures are unremarkable. IMPRESSION: No active disease. Electronically Signed   By: Duanne Guess D.O.   On: 04/15/2022 12:30    Procedures Procedures    Medications Ordered in ED Medications  iohexol (OMNIPAQUE) 350 MG/ML injection 75 mL (75 mLs  Intravenous Contrast Given 04/15/22 1308)    ED Course/ Medical Decision Making/ A&P                           Medical Decision Making Patient presents as above.  Fall sounds as though it was relatively low mechanism (6 inches from bed to floor), however as patient is on blood thinners and has midline spinal tenderness throughout and difficulty providing history, trauma scans ordered.  Amount and/or Complexity of Data Reviewed Labs: ordered. Decision-making details documented in ED Course. Radiology: ordered and independent interpretation performed. Decision-making details documented in ED Course.  Risk Prescription drug management.   CT C-spine showed no acute fracture or malalignment of CT C-spine.  No acute intracranial abnormality.  No acute traumatic injury to chest, abdomen, pelvis including T, L-spine.  Labs showed normal WBC, hemoglobin very slightly low at 11.5, platelets normal.  CMP with glucose 101, albumin 3.4, otherwise completely within normal limits.  PT/INR unremarkable.  Patient is stable for discharge at this time.  She remained hemodynamically stable throughout her observation time here in the emergency department.  She was observed for  a period of over 3 hours and did not have any acute changes in mental status.  Her imaging is overall reassuring.  Her physical exam is overall reassuring.  She is discharged in stable condition to return to her nursing facility.         Final Clinical Impression(s) / ED Diagnoses Final diagnoses:  Fall, initial encounter    Rx / DC Orders ED Discharge Orders     None         Linward Foster, MD 04/15/22 1526    Blane Ohara, MD 04/17/22 [phone]

## 2022-04-15 NOTE — Progress Notes (Signed)
Orthopedic Tech Progress Note Patient Details:  [name] [birth year] [id number] Level 2 Trauma. Not needed Patient ID: [name], female   DOB: [birth year], 66 y.o.   MRN: [id number]  [name] 04/15/2022, 2:42 PM

## 2022-04-15 NOTE — ED Notes (Signed)
..  Trauma Response Nurse Documentation   [name] is a 66 y.o. female arriving to [facility] ED via EMS  On Xarelto (rivaroxaban) daily. Trauma was activated as a Level 2 by charge nurse based on the following trauma criteria GCS 10-14 associated with trauma or AVPU < A. Trauma team at the bedside on patient arrival.   Patient cleared for CT by Dr. Louellen Molder. Pt transported to CT with trauma response nurse present to monitor. RN remained with the patient throughout their absence from the department for clinical observation.   GCS 14.  History   Past Medical History:  Diagnosis Date   Allergic rhinitis    Blood dyscrasia    Dementia (HCC)    Depression    Diabetes mellitus    Dysmetabolic syndrome X    Family history of anesthesia complication    brother "   Hypertension    Hypokalemia    Multiple sclerosis (HCC)    Neuropathic pain    Urinary incontinence    Vitamin D deficiency      Past Surgical History:  Procedure Laterality Date   ABDOMINAL HYSTERECTOMY     CESAREAN SECTION         Initial Focused Assessment (If applicable, or please see trauma documentation): No obvious injuries, contractures at baseline of LE. Alert, pupils equal and responsive.   CT's Completed:   CT Head, CT C-Spine, CT Chest w/ contrast, and CT abdomen/pelvis w/ contrast   Interventions:  Initial trauma assessment, pt placed on CCM, IV established, pt transported to/from CT without incident.   Plan for disposition:  Discharge home   Consults completed:  none  Event Summary: Pt arrived via [facility] from SNF with alert patient in Ccollar. Per staff pt rolled out of bed this am (about 6in). Pt noted to have discoloration to R side of face, unclear if this area is a birthmark or older injury. Pt log rolled, pt reporting tenderness to all location on her body. Pt transported to CT on CCM with TRN. Pt reports she would like something to eat asap.  Scans completed without incident and pt  transported back to ED21.  -all Scans and Xrays neg for acute processes.  Anticipate d/c back to SNF   Bedside handoff with ED RN Carina.    Milisa Kimbell Dee  Trauma Response RN  Please call TRN at [phone] for further assistance.

## 2022-04-15 NOTE — ED Triage Notes (Signed)
Level 2 trauma activation for fall on thinners, GCS 14. C-collar in place. VSS.

## 2022-06-01 ENCOUNTER — Non-Acute Institutional Stay: Payer: Medicare Other | Admitting: Hospice

## 2022-06-01 DIAGNOSIS — R52 Pain, unspecified: Secondary | ICD-10-CM

## 2022-06-01 DIAGNOSIS — G35 Multiple sclerosis: Secondary | ICD-10-CM

## 2022-06-01 DIAGNOSIS — Z515 Encounter for palliative care: Secondary | ICD-10-CM

## 2022-06-01 DIAGNOSIS — R1310 Dysphagia, unspecified: Secondary | ICD-10-CM

## 2022-06-01 NOTE — Progress Notes (Signed)
Therapist, nutritional Palliative Care Consult Note Telephone: [phone]  Fax: [phone]   Date of encounter: 06/01/22 12:48 PM PATIENT NAME: [name] [address] [place] [address]   [phone] (home)  DOB: Nov 06, 1955 MRN: [id number] PRIMARY CARE PROVIDER:    Dr. Sharmon Revere, NP  REFERRING PROVIDER:   Dr. Sharmon Revere, NP  RESPONSIBLE PARTY:    Contact Information     Name Relation Home Work [place] Sister [phone]  [phone]   Louanna Raw [phone]     Wade,Adacia Niece [phone]          I met face to face with patient in the facility. Palliative Care was asked to follow this patient by consultation request of Clement Sayres, MD to address advance care planning and complex medical decision making. This is a follow-up visit                                    ASSESSMENT AND PLAN / RECOMMENDATIONS:   CODE STATUS: Full Code   Symptom Management/Plan:  MS- advanced;  dependent for all adl's; she is bed bound, contractures in upper and lower extremities.  Repositioning and range of motion exercises as tolerated.  Air mattress in bed, continue ongoing supportive care.  Dysphagia: continue pureed soft diet; she is a feeder, offer assistance during meals to ensure adequate oral intake. Aspiration precautions. ST consult as needed.   Pain-generalized pain, related to MS. Continue Tylenol and gabapentin as ordered.  Monitor for worsening pain.  Routine CBC CMP.  Follow up Palliative Care Visit: Palliative care will continue to follow for complex medical decision making, advance care planning, and clarification of goals. Return in 4-6 weeks or prn.   PPS: 30%  HOSPICE ELIGIBILITY/DIAGNOSIS: TBD  Chief Complaint: Follow-up visit  HISTORY OF PRESENT ILLNESS:  [name] is a 67 y.o. year old female  with MS,  cerebral infarction, cerebrovascular disease, hemiplegia, T2DM, DVT, depression, hypertension.  She denies pain/discomfort, no complaints today.  Rest of 10 point ROS asked and negative. History obtained from review of EMR, discussion with primary team, and interview with family, facility staff/caregiver and/or [name].  I reviewed available labs, medications, imaging, studies and related documents from the EMR.  Records reviewed and summarized above.   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   Cerebrovascular disease 01/17/2015   Encephalopathy, metabolic 12/29/2011   Pyelonephritis, acute 12/29/2011   Hypokalemia 12/29/2011   Depression    MS (multiple sclerosis) (HCC) 12/28/2011   Fever 12/28/2011   DM2 (diabetes mellitus, type 2) (HCC) 12/28/2011   PAST MEDICAL HISTORY:  Active Ambulatory Problems    Diagnosis Date Noted   MS (multiple sclerosis) (HCC) 12/28/2011   Fever 12/28/2011   DM2 (diabetes mellitus, type 2) (HCC) 12/28/2011   Encephalopathy, metabolic 12/29/2011   Depression    Pyelonephritis, acute 12/29/2011   Hypokalemia 12/29/2011   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   Cerebrovascular disease 01/17/2015   Pulmonary embolism (HCC) 11/13/2017   Pulmonary emboli (HCC) 11/13/2017   DVT (deep venous thrombosis) Rt Leg 11/14/2017  Leukocytosis 11/17/2017   Sepsis ([place]) 11/17/2017   Resolved Ambulatory Problems    Diagnosis Date Noted   Mental status, decreased 12/28/2011   Past Medical History:  Diagnosis Date   Allergic rhinitis    Blood dyscrasia    Dementia ([place])    Diabetes mellitus    Dysmetabolic syndrome X    Family history of anesthesia complication     Hypertension    Multiple sclerosis ([place])    Neuropathic pain    Urinary incontinence    Vitamin D deficiency    SOCIAL HX:  Social History   Tobacco Use   Smoking status: Never   Smokeless tobacco: Never  Substance Use Topics   Alcohol use: Yes    Alcohol/week: 0.0 standard drinks of alcohol    Comment: former    FAMILY HX:  Family History  Problem Relation Age of Onset   Multiple sclerosis Brother       ALLERGIES: No Known Allergies   PERTINENT MEDICATIONS:  Outpatient Encounter Medications as of 06/01/2022  Medication Sig   acetaminophen (TYLENOL) 325 MG tablet Take 650 mg by mouth See admin instructions. Take 650 mg by mouth two times a day and DO NOT EXCEED 3,000 MG IN HOURS   ammonium lactate (LAC-HYDRIN) 12 % lotion Apply 1 application. topically every 30 (thirty) days.   ARTIFICIAL TEAR SOLUTION OP Place 1 drop into both eyes 2 (two) times daily.   atorvastatin (LIPITOR) 10 MG tablet Take 5 mg by mouth every evening.   atorvastatin (LIPITOR) 20 MG tablet Take 1 tablet (20 mg total) by mouth daily. (Patient not taking: Reported on 10/21/2021)   bimatoprost (LUMIGAN) 0.01 % SOLN Place 1 drop into both eyes every evening.   cephALEXin (KEFLEX) 500 MG capsule Take 1 capsule (500 mg total) by mouth 2 (two) times daily. (Patient not taking: Reported on 10/21/2021)   cephALEXin (KEFLEX) 500 MG capsule Take 1 capsule (500 mg total) by mouth 4 (four) times daily.   Cholecalciferol (VITAMIN D3) 10 MCG (400 UNIT) CAPS Take 800 Units by mouth every evening.   cloNIDine (CATAPRES) 0.2 MG tablet Take 1 tablet (0.2 mg total) by mouth 3 (three) times daily. (Patient taking differently: Take 0.2 mg by mouth See admin instructions. Take 0.2 mg by mouth three times a day and hold for a Systolic reading less than 110)   Cranberry 450 MG TABS Take 450 mg by mouth 2 (two) times daily.   ferrous sulfate 325 (65 FE) MG tablet Take 1 tablet (325 mg total) by mouth daily with breakfast. (Patient taking  differently: Take 325 mg by mouth daily with supper.)   gabapentin (NEURONTIN) 300 MG capsule Take 1 capsule (300 mg total) by mouth every 12 (twelve) hours. (Patient not taking: Reported on 12/30/2021)   gabapentin (NEURONTIN) 400 MG capsule Take 400 mg by mouth 3 (three) times daily.   hydrALAZINE (APRESOLINE) 25 MG tablet Take 1 tablet (25 mg total) by mouth 3 (three) times daily. (Patient taking differently: Take 25 mg by mouth See admin instructions. Take 25 mg by mouth three times a day and hold of the Systolic reading is less than 110)   HYDROcodone-acetaminophen (NORCO/VICODIN) 5-325 MG tablet Take 1-2 tablets by mouth every 6 (six) hours as needed for moderate pain. (Patient not taking: Reported on 10/21/2021)   metFORMIN (GLUCOPHAGE) 500 MG tablet Take 500 mg by mouth daily with breakfast.   metoprolol (TOPROL-XL) 200 MG 24 hr tablet Take 200 mg by mouth See admin instructions.  Take 200 mg by mouth in the morning and hold if the heart rate is less than 60   metoprolol succinate (TOPROL-XL) 100 MG 24 hr tablet Take 1 tablet (100 mg total) by mouth daily. Take with or immediately following a meal. (Patient not taking: Reported on 10/21/2021)   metoprolol tartrate (LOPRESSOR) 100 MG tablet Take 100 mg by mouth 2 (two) times daily.   Rivaroxaban (XARELTO) 15 MG TABS tablet Take 1 tablet (15 mg total) by mouth 2 (two) times daily with a meal. (Patient not taking: Reported on 10/21/2021)   rivaroxaban (XARELTO) 20 MG TABS tablet Take 1 tablet (20 mg total) by mouth daily with supper.   sennosides-docusate sodium (SENOKOT-S) 8.6-50 MG tablet Take 2 tablets by mouth at bedtime.   SYSTANE BALANCE 0.6 % SOLN Place 1 drop into both eyes 2 (two) times daily.   No facility-administered encounter medications on file as of 06/01/2022.   I spent 40 minutes providing this consultation; this includes time spent with patient/family, chart review and documentation. More than 50% of the time in this consultation was  spent on counseling and coordinating communication.   Thank you for the opportunity to participate in the care of [name].  The palliative care team will continue to follow. Please call our office at [phone] if we can be of additional assistance.   Teodoro Spray, NP

## 2022-07-02 ENCOUNTER — Non-Acute Institutional Stay: Payer: Medicare Other | Admitting: Hospice

## 2022-07-02 DIAGNOSIS — Z515 Encounter for palliative care: Secondary | ICD-10-CM

## 2022-07-02 DIAGNOSIS — R1310 Dysphagia, unspecified: Secondary | ICD-10-CM

## 2022-07-02 DIAGNOSIS — R52 Pain, unspecified: Secondary | ICD-10-CM

## 2022-07-02 DIAGNOSIS — G35 Multiple sclerosis: Secondary | ICD-10-CM

## 2022-07-02 NOTE — Progress Notes (Signed)
[facility] Consult Note Telephone: [phone]  Fax: [phone]   Date of encounter: 07/02/22 11:17 AM PATIENT NAME: [name] [address] [place] [address]   [phone] (home)  DOB: Jun 09, 1955 MRN: [id number] PRIMARY CARE PROVIDER:    Dr. Meyer Russel, NP  REFERRING PROVIDER:   Dr. Meyer Russel, NP  RESPONSIBLE PARTY:    Contact Information     Name Relation Home Work [place] Sister [phone]  [phone]   Elizabeth Palau [phone]     Wade,Adacia Niece [phone]          I met face to face with patient in the facility. Palliative Care was asked to follow this patient by consultation request of Lincoln Brigham, MD to address advance care planning and complex medical decision making. This is a follow-up visit  Counselling on coping with chronic illness. She reports watching TV and her spirituality helps her to cope.                                   ASSESSMENT AND PLAN / RECOMMENDATIONS:   CODE STATUS: Full Code   Symptom Management/Plan:  MS- advanced;  Out of bed daily. Patient is dependent for all adl's; she is bed bound, contractures in upper and lower extremities.  Repositioning and range of motion exercises as tolerated.  Air mattress in bed, continue ongoing supportive care.  Dysphagia: no report of aspiration since last visit; continue pureed soft diet; she is a feeder, offer assistance during meals to ensure adequate oral intake. Continue aspiration precautions. ST consult as needed.   Pain-generalized pain, related to MS. Continue Tylenol and gabapentin as ordered.  Monitor for worsening pain.  Routine CBC CMP.  Follow up Palliative Care Visit: Palliative care will continue to follow for complex medical decision making, advance care planning, and clarification of goals. Return in 4-6 weeks  or prn.   PPS: 30%  HOSPICE ELIGIBILITY/DIAGNOSIS: TBD  Chief Complaint: Follow-up visit  HISTORY OF PRESENT ILLNESS:  [name] is a 67 y.o. year old female  with MS, cerebral infarction, cerebrovascular disease, hemiplegia, T2DM, DVT, depression, hypertension.  Patient in no distress, resting in bed, denies pain/discomfort, no complaints today.   Rest of 10 point ROS asked and negative. History obtained from review of EMR, discussion with primary team, and interview with family, facility staff/caregiver and/or [name].  I reviewed available labs, medications, imaging, studies and related documents from the EMR.  Records reviewed and summarized above.   CURRENT PROBLEM LIST:  Patient Active Problem List   Diagnosis Date Noted   Leukocytosis 11/17/2017   Sepsis ([facility]) 11/17/2017   DVT (deep venous thrombosis) Rt Leg 11/14/2017   Pulmonary embolism ([place] [facility]) 11/13/2017   Pulmonary emboli ([place]) 11/13/2017   Secondary progressive multiple sclerosis ([facility]) 01/17/2015   Cerebrovascular disease 01/17/2015   Encephalopathy, metabolic 99991111   Pyelonephritis, acute 12/29/2011   Hypokalemia 12/29/2011   Depression    MS (multiple sclerosis) ([place]) 12/28/2011   Fever 12/28/2011   DM2 (diabetes mellitus, type 2) ([facility]) 12/28/2011   PAST MEDICAL HISTORY:  Active Ambulatory Problems    Diagnosis Date Noted   MS (multiple sclerosis) ([facility]) 12/28/2011   Fever 12/28/2011   DM2 (diabetes mellitus, type 2) ([facility]) 12/28/2011   Encephalopathy, metabolic 99991111   Depression    Pyelonephritis, acute 12/29/2011  Hypokalemia 12/29/2011   Secondary progressive multiple sclerosis ([place]) 01/17/2015   Cerebrovascular disease 01/17/2015   Pulmonary embolism ([facility]) 11/13/2017   Pulmonary emboli ([facility]) 11/13/2017   DVT (deep venous thrombosis) Rt Leg 11/14/2017   Leukocytosis 11/17/2017   Sepsis ([facility]) 11/17/2017   Resolved Ambulatory Problems    Diagnosis Date Noted   Mental status,  decreased 12/28/2011   Past Medical History:  Diagnosis Date   Allergic rhinitis    Blood dyscrasia    Dementia (HCC)    Diabetes mellitus    Dysmetabolic syndrome X    Family history of anesthesia complication    Hypertension    Multiple sclerosis (HCC)    Neuropathic pain    Urinary incontinence    Vitamin D deficiency    SOCIAL HX:  Social History   Tobacco Use   Smoking status: Never   Smokeless tobacco: Never  Substance Use Topics   Alcohol use: Yes    Alcohol/week: 0.0 standard drinks of alcohol    Comment: former    FAMILY HX:  Family History  Problem Relation Age of Onset   Multiple sclerosis Brother       ALLERGIES: No Known Allergies   PERTINENT MEDICATIONS:  Outpatient Encounter Medications as of 07/02/2022  Medication Sig   acetaminophen (TYLENOL) 325 MG tablet Take 650 mg by mouth See admin instructions. Take 650 mg by mouth two times a day and DO NOT EXCEED 3,000 MG IN HOURS   ammonium lactate (LAC-HYDRIN) 12 % lotion Apply 1 application. topically every 30 (thirty) days.   ARTIFICIAL TEAR SOLUTION OP Place 1 drop into both eyes 2 (two) times daily.   atorvastatin (LIPITOR) 10 MG tablet Take 5 mg by mouth every evening.   atorvastatin (LIPITOR) 20 MG tablet Take 1 tablet (20 mg total) by mouth daily. (Patient not taking: Reported on 10/21/2021)   bimatoprost (LUMIGAN) 0.01 % SOLN Place 1 drop into both eyes every evening.   cephALEXin (KEFLEX) 500 MG capsule Take 1 capsule (500 mg total) by mouth 2 (two) times daily. (Patient not taking: Reported on 10/21/2021)   cephALEXin (KEFLEX) 500 MG capsule Take 1 capsule (500 mg total) by mouth 4 (four) times daily.   Cholecalciferol (VITAMIN D3) 10 MCG (400 UNIT) CAPS Take 800 Units by mouth every evening.   cloNIDine (CATAPRES) 0.2 MG tablet Take 1 tablet (0.2 mg total) by mouth 3 (three) times daily. (Patient taking differently: Take 0.2 mg by mouth See admin instructions. Take 0.2 mg by mouth three times a day and  hold for a Systolic reading less than 110)   Cranberry 450 MG TABS Take 450 mg by mouth 2 (two) times daily.   ferrous sulfate 325 (65 FE) MG tablet Take 1 tablet (325 mg total) by mouth daily with breakfast. (Patient taking differently: Take 325 mg by mouth daily with supper.)   gabapentin (NEURONTIN) 300 MG capsule Take 1 capsule (300 mg total) by mouth every 12 (twelve) hours. (Patient not taking: Reported on 12/30/2021)   gabapentin (NEURONTIN) 400 MG capsule Take 400 mg by mouth 3 (three) times daily.   hydrALAZINE (APRESOLINE) 25 MG tablet Take 1 tablet (25 mg total) by mouth 3 (three) times daily. (Patient taking differently: Take 25 mg by mouth See admin instructions. Take 25 mg by mouth three times a day and hold of the Systolic reading is less than 110)   HYDROcodone-acetaminophen (NORCO/VICODIN) 5-325 MG tablet Take 1-2 tablets by mouth every 6 (six) hours as needed for moderate pain. (  Patient not taking: Reported on 10/21/2021)   metFORMIN (GLUCOPHAGE) 500 MG tablet Take 500 mg by mouth daily with breakfast.   metoprolol (TOPROL-XL) 200 MG 24 hr tablet Take 200 mg by mouth See admin instructions. Take 200 mg by mouth in the morning and hold if the heart rate is less than 60   metoprolol succinate (TOPROL-XL) 100 MG 24 hr tablet Take 1 tablet (100 mg total) by mouth daily. Take with or immediately following a meal. (Patient not taking: Reported on 10/21/2021)   metoprolol tartrate (LOPRESSOR) 100 MG tablet Take 100 mg by mouth 2 (two) times daily.   Rivaroxaban (XARELTO) 15 MG TABS tablet Take 1 tablet (15 mg total) by mouth 2 (two) times daily with a meal. (Patient not taking: Reported on 10/21/2021)   rivaroxaban (XARELTO) 20 MG TABS tablet Take 1 tablet (20 mg total) by mouth daily with supper.   sennosides-docusate sodium (SENOKOT-S) 8.6-50 MG tablet Take 2 tablets by mouth at bedtime.   SYSTANE BALANCE 0.6 % SOLN Place 1 drop into both eyes 2 (two) times daily.   No facility-administered  encounter medications on file as of 07/02/2022.   I spent 35 minutes providing this consultation; this includes time spent with patient/family, chart review and documentation. More than 50% of the time in this consultation was spent on counseling and coordinating communication.    Thank you for the opportunity to participate in the care of [name].  The palliative care team will continue to follow. Please call our office at [phone] if we can be of additional assistance.   Teodoro Spray, NP

## 2022-07-19 ENCOUNTER — Non-Acute Institutional Stay: Payer: Medicare Other | Admitting: Hospice

## 2022-07-19 DIAGNOSIS — G35 Multiple sclerosis: Secondary | ICD-10-CM

## 2022-07-19 DIAGNOSIS — R52 Pain, unspecified: Secondary | ICD-10-CM

## 2022-07-19 DIAGNOSIS — Z515 Encounter for palliative care: Secondary | ICD-10-CM

## 2022-07-19 DIAGNOSIS — R1310 Dysphagia, unspecified: Secondary | ICD-10-CM

## 2022-07-19 NOTE — Progress Notes (Signed)
[facility] Consult Note Telephone: [phone]  Fax: [phone]   Date of encounter: 07/19/22 3:58 PM PATIENT NAME: [name] [name] [id number]   [phone] (home)  DOB: November 27, 1955 MRN: [id number] PRIMARY CARE PROVIDER:    Dr. Meyer Russel, NP  REFERRING PROVIDER:   Dr. Meyer Russel, NP  RESPONSIBLE PARTY:    Contact Information     Name Relation Home Work [place] Sister [phone]  [phone]   Elizabeth Palau [phone]     Wade,Adacia Niece [phone]          I met face to face with patient in the facility. Palliative Care was asked to follow this patient by consultation request of Lincoln Brigham, MD to address advance care planning and complex medical decision making. This is a follow-up visit  Counselling on coping with chronic illness. She reports watching TV and her spirituality helps her to cope.  She also enjoys to participate in some of the facility activities.                                  ASSESSMENT AND PLAN / RECOMMENDATIONS:   CODE STATUS: Full Code   Symptom Management/Plan:  MS- advanced; continue out of bed daily. Patient is dependent for all adl's; she is bed bound, contractures in upper and lower extremities.  Repositioning and range of motion exercises as tolerated.  Air mattress in bed, continue ongoing supportive care.  Dysphagia: Continue aspiration precautions, pured diet. Offer assistance during meals to ensure adequate oral intake.  ST consult as needed.   Pain-generalized pain: related to MS. stable.  Continue Tylenol and gabapentin as ordered.  Monitor for worsening pain.  Routine CBC CMP.  Follow up Palliative Care Visit: Palliative care will continue to follow for complex medical decision making, advance care planning, and clarification of goals. Return in  4-6 weeks or prn.   PPS: 30%  HOSPICE ELIGIBILITY/DIAGNOSIS: TBD  Chief Complaint: Follow-up visit  HISTORY OF PRESENT ILLNESS:  [name] is a 67 y.o. year old female  with MS, cerebral infarction, cerebrovascular disease, hemiplegia, T2DM, DVT, depression, hypertension.  Patient in no distress, seen in her room and also at the dining/common area; she participated in facility activities today.  She denies pain/discomfort, no complaints today.   Rest of 10 point ROS asked and negative. History obtained from review of EMR, discussion with primary team, and interview with family, facility staff/caregiver and/or Ms. Mendel Ryder.  I reviewed available labs, medications, imaging, studies and related documents from the EMR.  Records reviewed and summarized above.   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   Cerebrovascular disease 01/17/2015   Encephalopathy, metabolic 99991111   Pyelonephritis, acute 12/29/2011   Hypokalemia 12/29/2011   Depression    MS (multiple sclerosis) ([place]) 12/28/2011   Fever 12/28/2011   DM2 (diabetes mellitus, type 2) ([place]) 12/28/2011   PAST MEDICAL HISTORY:  Active Ambulatory Problems    Diagnosis Date Noted   MS (multiple sclerosis) ([place]) 12/28/2011   Fever 12/28/2011   DM2 (diabetes mellitus, type 2) ([place]) 12/28/2011  Encephalopathy, metabolic 99991111   Depression    Pyelonephritis, acute 12/29/2011   Hypokalemia 12/29/2011   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   Cerebrovascular disease 01/17/2015   Pulmonary embolism ([place]) 11/13/2017   Pulmonary emboli ([place]) 11/13/2017   DVT (deep venous thrombosis) Rt Leg 11/14/2017   Leukocytosis 11/17/2017   Sepsis  ([place]) 11/17/2017   Resolved Ambulatory Problems    Diagnosis Date Noted   Mental status, decreased 12/28/2011   Past Medical History:  Diagnosis Date   Allergic rhinitis    Blood dyscrasia    Dementia (HCC)    Diabetes mellitus    Dysmetabolic syndrome X    Family history of anesthesia complication    Hypertension    Multiple sclerosis ([place])    Neuropathic pain    Urinary incontinence    Vitamin D deficiency    SOCIAL HX:  Social History   Tobacco Use   Smoking status: Never   Smokeless tobacco: Never  Substance Use Topics   Alcohol use: Yes    Alcohol/week: 0.0 standard drinks of alcohol    Comment: former    FAMILY HX:  Family History  Problem Relation Age of Onset   Multiple sclerosis Brother       ALLERGIES: No Known Allergies   PERTINENT MEDICATIONS:  Outpatient Encounter Medications as of 07/19/2022  Medication Sig   acetaminophen (TYLENOL) 325 MG tablet Take 650 mg by mouth See admin instructions. Take 650 mg by mouth two times a day and DO NOT EXCEED 3,000 MG IN HOURS   ammonium lactate (LAC-HYDRIN) 12 % lotion Apply 1 application. topically every 30 (thirty) days.   ARTIFICIAL TEAR SOLUTION OP Place 1 drop into both eyes 2 (two) times daily.   atorvastatin (LIPITOR) 10 MG tablet Take 5 mg by mouth every evening.   atorvastatin (LIPITOR) 20 MG tablet Take 1 tablet (20 mg total) by mouth daily. (Patient not taking: Reported on 10/21/2021)   bimatoprost (LUMIGAN) 0.01 % SOLN Place 1 drop into both eyes every evening.   cephALEXin (KEFLEX) 500 MG capsule Take 1 capsule (500 mg total) by mouth 2 (two) times daily. (Patient not taking: Reported on 10/21/2021)   cephALEXin (KEFLEX) 500 MG capsule Take 1 capsule (500 mg total) by mouth 4 (four) times daily.   Cholecalciferol (VITAMIN D3) 10 MCG (400 UNIT) CAPS Take 800 Units by mouth every evening.   cloNIDine (CATAPRES) 0.2 MG tablet Take 1 tablet (0.2 mg total) by mouth 3 (three) times daily. (Patient taking  differently: Take 0.2 mg by mouth See admin instructions. Take 0.2 mg by mouth three times a day and hold for a Systolic reading less than 110)   Cranberry 450 MG TABS Take 450 mg by mouth 2 (two) times daily.   ferrous sulfate 325 (65 FE) MG tablet Take 1 tablet (325 mg total) by mouth daily with breakfast. (Patient taking differently: Take 325 mg by mouth daily with supper.)   gabapentin (NEURONTIN) 300 MG capsule Take 1 capsule (300 mg total) by mouth every 12 (twelve) hours. (Patient not taking: Reported on 12/30/2021)   gabapentin (NEURONTIN) 400 MG capsule Take 400 mg by mouth 3 (three) times daily.   hydrALAZINE (APRESOLINE) 25 MG tablet Take 1 tablet (25 mg total) by mouth 3 (three) times daily. (Patient taking differently: Take 25 mg by mouth See admin instructions. Take 25 mg by mouth three times a day and hold of the Systolic reading is less than 110)   HYDROcodone-acetaminophen (NORCO/VICODIN) 5-325 MG tablet  Take 1-2 tablets by mouth every 6 (six) hours as needed for moderate pain. (Patient not taking: Reported on 10/21/2021)   metFORMIN (GLUCOPHAGE) 500 MG tablet Take 500 mg by mouth daily with breakfast.   metoprolol (TOPROL-XL) 200 MG 24 hr tablet Take 200 mg by mouth See admin instructions. Take 200 mg by mouth in the morning and hold if the heart rate is less than 60   metoprolol succinate (TOPROL-XL) 100 MG 24 hr tablet Take 1 tablet (100 mg total) by mouth daily. Take with or immediately following a meal. (Patient not taking: Reported on 10/21/2021)   metoprolol tartrate (LOPRESSOR) 100 MG tablet Take 100 mg by mouth 2 (two) times daily.   Rivaroxaban (XARELTO) 15 MG TABS tablet Take 1 tablet (15 mg total) by mouth 2 (two) times daily with a meal. (Patient not taking: Reported on 10/21/2021)   rivaroxaban (XARELTO) 20 MG TABS tablet Take 1 tablet (20 mg total) by mouth daily with supper.   sennosides-docusate sodium (SENOKOT-S) 8.6-50 MG tablet Take 2 tablets by mouth at bedtime.   SYSTANE  BALANCE 0.6 % SOLN Place 1 drop into both eyes 2 (two) times daily.   No facility-administered encounter medications on file as of 07/19/2022.   I spent 35 minutes providing this consultation; this includes time spent with patient/family, chart review and documentation. More than 50% of the time in this consultation was spent on counseling and coordinating communication.    Thank you for the opportunity to participate in the care of Ms. Mendel Ryder.  The palliative care team will continue to follow. Please call our office at [phone] if we can be of additional assistance.   Teodoro Spray, NP

## 2022-09-16 ENCOUNTER — Non-Acute Institutional Stay: Payer: Medicare Other | Admitting: Hospice

## 2022-09-16 DIAGNOSIS — K5901 Slow transit constipation: Secondary | ICD-10-CM

## 2022-09-16 DIAGNOSIS — Z515 Encounter for palliative care: Secondary | ICD-10-CM

## 2022-09-16 DIAGNOSIS — G35 Multiple sclerosis: Secondary | ICD-10-CM

## 2022-09-16 DIAGNOSIS — R1310 Dysphagia, unspecified: Secondary | ICD-10-CM

## 2022-09-16 DIAGNOSIS — R52 Pain, unspecified: Secondary | ICD-10-CM

## 2022-09-16 NOTE — Progress Notes (Signed)
[facility] Consult Note Telephone: [phone]  Fax: [phone]   Date of encounter: 09/16/22 11:18 AM PATIENT NAME: [name] [address] [place] [address]   [phone] (home)  DOB: 1955-08-31 MRN: [id number] PRIMARY CARE PROVIDER:    Dr. Sharmon Revere, NP  REFERRING PROVIDER:   Dr. Sharmon Revere, NP  RESPONSIBLE PARTY:    Contact Information     Name Relation Home Work [place] Sister [phone]  [phone]   Louanna Raw [phone]     Wade,Adacia Niece [phone]          I met face to face with patient in the facility. Palliative Care was asked to follow this patient by consultation request of Clement Sayres, MD to address advance care planning and complex medical decision making. This is a follow-up visit  Visit consisted of counseling and education dealing with the complex and emotionally intense issues of symptom management and palliative care in the setting of serious and potentially life-threatening illness. Palliative care team will continue to support patient, patient's family, and medical team.  She reports watching TV and her spirituality helps her to cope.  She continues to  enjoy to participate in some of the facility activities.                                  ASSESSMENT AND PLAN / RECOMMENDATIONS:   CODE STATUS: Full Code   Symptom Management/Plan:  MS- advanced with severe functional impairment. Patient is dependent for all adl's; she is bed bound, contractures in upper and lower extremities.   Continue out of bed daily.  Repositioning and range of motion exercises as tolerated.   Air mattress in bed, continue ongoing supportive care.   Dysphagia: Continue aspiration precautions, pured diet. Offer assistance during meals to ensure adequate oral intake.  ST consult as needed.    Pain-generalized pain: related to MS. stable.  Continue Tylenol and gabapentin as ordered.  Monitor for worsening pain.  Routine CBC CMP. Constipation: Continue senna with docusate sodium.  Follow up Palliative Care Visit: Palliative care will continue to follow for complex medical decision making, advance care planning, and clarification of goals. Return in 4-6 weeks or prn.   PPS: 30%  HOSPICE ELIGIBILITY/DIAGNOSIS: TBD  Chief Complaint: Follow-up visit  HISTORY OF PRESENT ILLNESS:  [name] is a 67 y.o. year old female  with MS, cerebral infarction, cerebrovascular disease, hemiplegia, T2DM, DVT, depression, hypertension.  Patient in no distress, seen in her room in no acute distress.  She reports her occasional generalized pain is well-managed with current pain regimen, denies pain/discomfort, no complaints today from nursing.   Rest of 10 point ROS asked and negative. History obtained from review of EMR, discussion with primary team, and interview with family, facility staff/caregiver and/or [name].  I reviewed available labs, medications, imaging, studies and related documents from the EMR.  Records reviewed and summarized above.   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   Cerebrovascular disease 01/17/2015   Encephalopathy, metabolic 12/29/2011   Pyelonephritis, acute 12/29/2011   Hypokalemia 12/29/2011  Depression    MS (multiple sclerosis) (HCC) 12/28/2011   Fever 12/28/2011   DM2 (diabetes mellitus, type 2) ([facility]) 12/28/2011   PAST MEDICAL HISTORY:  Active Ambulatory Problems    Diagnosis Date Noted   MS (multiple sclerosis) (HCC) 12/28/2011   Fever 12/28/2011   DM2 (diabetes mellitus,  type 2) ([facility]) 12/28/2011   Encephalopathy, metabolic 12/29/2011   Depression    Pyelonephritis, acute 12/29/2011   Hypokalemia 12/29/2011   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   Cerebrovascular disease 01/17/2015   Pulmonary embolism ([facility]CC) 11/13/2017   Pulmonary emboli (HCC) 11/13/2017   DVT (deep venous thrombosis) Rt Leg 11/14/2017   Leukocytosis 11/17/2017   Sepsis (HCC) 11/17/2017   Resolved Ambulatory Problems    Diagnosis Date Noted   Mental status, decreased 12/28/2011   Past Medical History:  Diagnosis Date   Allergic rhinitis    Blood dyscrasia    Dementia (HCC)    Diabetes mellitus    Dysmetabolic syndrome X    Family history of anesthesia complication    Hypertension    Multiple sclerosis (HCC)    Neuropathic pain    Urinary incontinence    Vitamin D deficiency    SOCIAL HX:  Social History   Tobacco Use   Smoking status: Never   Smokeless tobacco: Never  Substance Use Topics   Alcohol use: Yes    Alcohol/week: 0.0 standard drinks of alcohol    Comment: former    FAMILY HX:  Family History  Problem Relation Age of Onset   Multiple sclerosis Brother       ALLERGIES: No Known Allergies   PERTINENT MEDICATIONS:  Outpatient Encounter Medications as of 09/16/2022  Medication Sig   acetaminophen (TYLENOL) 325 MG tablet Take 650 mg by mouth See admin instructions. Take 650 mg by mouth two times a day and DO NOT EXCEED 3,000 MG IN HOURS   ammonium lactate (LAC-HYDRIN) 12 % lotion Apply 1 application. topically every 30 (thirty) days.   ARTIFICIAL TEAR SOLUTION OP Place 1 drop into both eyes 2 (two) times daily.   atorvastatin (LIPITOR) 10 MG tablet Take 5 mg by mouth every evening.   atorvastatin (LIPITOR) 20 MG tablet Take 1 tablet (20 mg total) by mouth daily. (Patient not taking: Reported on 10/21/2021)   bimatoprost (LUMIGAN) 0.01 % SOLN Place 1 drop into both eyes every evening.   cephALEXin (KEFLEX) 500 MG capsule Take 1 capsule  (500 mg total) by mouth 2 (two) times daily. (Patient not taking: Reported on 10/21/2021)   cephALEXin (KEFLEX) 500 MG capsule Take 1 capsule (500 mg total) by mouth 4 (four) times daily.   Cholecalciferol (VITAMIN D3) 10 MCG (400 UNIT) CAPS Take 800 Units by mouth every evening.   cloNIDine (CATAPRES) 0.2 MG tablet Take 1 tablet (0.2 mg total) by mouth 3 (three) times daily. (Patient taking differently: Take 0.2 mg by mouth See admin instructions. Take 0.2 mg by mouth three times a day and hold for a Systolic reading less than 110)   Cranberry 450 MG TABS Take 450 mg by mouth 2 (two) times daily.   ferrous sulfate 325 (65 FE) MG tablet Take 1 tablet (325 mg total) by mouth daily with breakfast. (Patient taking differently: Take 325 mg by mouth daily with supper.)   gabapentin (NEURONTIN) 300 MG capsule Take 1 capsule (300 mg total) by mouth every 12 (twelve) hours. (Patient not taking: Reported on 12/30/2021)   gabapentin (NEURONTIN) 400 MG capsule Take 400 mg by mouth 3 (  three) times daily.   hydrALAZINE (APRESOLINE) 25 MG tablet Take 1 tablet (25 mg total) by mouth 3 (three) times daily. (Patient taking differently: Take 25 mg by mouth See admin instructions. Take 25 mg by mouth three times a day and hold of the Systolic reading is less than 045)   HYDROcodone-acetaminophen (NORCO/VICODIN) 5-325 MG tablet Take 1-2 tablets by mouth every 6 (six) hours as needed for moderate pain. (Patient not taking: Reported on 10/21/2021)   metFORMIN (GLUCOPHAGE) 500 MG tablet Take 500 mg by mouth daily with breakfast.   metoprolol (TOPROL-XL) 200 MG 24 hr tablet Take 200 mg by mouth See admin instructions. Take 200 mg by mouth in the morning and hold if the heart rate is less than 60   metoprolol succinate (TOPROL-XL) 100 MG 24 hr tablet Take 1 tablet (100 mg total) by mouth daily. Take with or immediately following a meal. (Patient not taking: Reported on 10/21/2021)   metoprolol tartrate (LOPRESSOR) 100 MG tablet Take  100 mg by mouth 2 (two) times daily.   Rivaroxaban (XARELTO) 15 MG TABS tablet Take 1 tablet (15 mg total) by mouth 2 (two) times daily with a meal. (Patient not taking: Reported on 10/21/2021)   rivaroxaban (XARELTO) 20 MG TABS tablet Take 1 tablet (20 mg total) by mouth daily with supper.   sennosides-docusate sodium (SENOKOT-S) 8.6-50 MG tablet Take 2 tablets by mouth at bedtime.   SYSTANE BALANCE 0.6 % SOLN Place 1 drop into both eyes 2 (two) times daily.   No facility-administered encounter medications on file as of 09/16/2022.   I spent 35 minutes providing this consultation; this includes time spent with patient/family, chart review and documentation. More than 50% of the time in this consultation was spent on counseling and coordinating communication.    Thank you for the opportunity to participate in the care of [name].  The palliative care team will continue to follow. Please call our office at [phone] if we can be of additional assistance.   Rosaura Carpenter, NP

## 2022-10-19 ENCOUNTER — Non-Acute Institutional Stay: Payer: Medicare Other | Admitting: Hospice

## 2022-10-19 DIAGNOSIS — R52 Pain, unspecified: Secondary | ICD-10-CM

## 2022-10-19 DIAGNOSIS — Z515 Encounter for palliative care: Secondary | ICD-10-CM

## 2022-10-19 DIAGNOSIS — K5901 Slow transit constipation: Secondary | ICD-10-CM

## 2022-10-19 DIAGNOSIS — R1310 Dysphagia, unspecified: Secondary | ICD-10-CM

## 2022-10-19 DIAGNOSIS — G35 Multiple sclerosis: Secondary | ICD-10-CM

## 2022-10-19 NOTE — Progress Notes (Signed)
Therapist, nutritional Palliative Care Consult Note Telephone: [phone]  Fax: [phone]   Date of encounter: 10/19/22 3:27 PM PATIENT NAME: [name] [address] [place] [address]   [phone] (home)  DOB: 1955/09/29 MRN: [id number] PRIMARY CARE PROVIDER:    Dr. Sharmon Revere, NP  REFERRING PROVIDER:   Dr. Sharmon Revere, NP  RESPONSIBLE PARTY:    Contact Information     Name Relation Home Work [place] Sister [phone]  [phone]   Louanna Raw [phone]     Wade,Adacia Niece [phone]          I met face to face with patient in the facility. Palliative Care was asked to follow this patient by consultation request of Clement Sayres, MD to address advance care planning and complex medical decision making. This is a follow-up visit  Visit consisted of counseling and education dealing with the complex and emotionally intense issues of symptom management and palliative care in the setting of serious and potentially life-threatening illness. Palliative care team will continue to support patient, patient's family, and medical team.  She reports watching TV and her spirituality helps her to cope.  She continues to  enjoy to participate in some of the facility activities.                                  ASSESSMENT AND PLAN / RECOMMENDATIONS:   CODE STATUS: Full Code   Symptom Management/Plan: Patient has been fairly stable in her chronic diseases, no exacerbations, no acuities or hospitalization since last visit.  No changes to to plan of care. MS- continue ongoing supportive care; advanced with severe functional impairment. Patient is dependent for all adl's; she is bed bound, contractures in upper and lower extremities.   Continue out of bed daily.  Repositioning and range of motion exercises as tolerated.   Air mattress in bed.    Dysphagia: Continue aspiration precautions, pured diet. Offer assistance during meals to ensure adequate oral intake.  ST consult as needed.   Pain-generalized pain: related to MS. stable.  Continue Tylenol and gabapentin as ordered.  Monitor for worsening pain.  Routine CBC CMP. Constipation: Continue senna with docusate sodium.  Follow up Palliative Care Visit: Palliative care will continue to follow for complex medical decision making, advance care planning, and clarification of goals. Return in 4-6 weeks or prn.   PPS: 30%  HOSPICE ELIGIBILITY/DIAGNOSIS: TBD  Chief Complaint: Follow-up visit  HISTORY OF PRESENT ILLNESS:  [name] is a 67 y.o. year old female  with MS, cerebral infarction, cerebrovascular disease, hemiplegia, T2DM, DVT, depression, hypertension.  Patient in no distress, seen in her room in no acute distress.  She reports her occasional generalized pain is well-managed with current pain regimen, denies pain/discomfort, reports good appetite, no complaints today from nursing.   Rest of 10 point ROS asked and negative. History obtained from review of EMR, discussion with primary team, and interview with family, facility staff/caregiver and/or [name].  I reviewed available labs, medications, imaging, studies and related documents from the EMR.  Records reviewed and summarized above.   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   Cerebrovascular disease 01/17/2015   Encephalopathy, metabolic 12/29/2011   Pyelonephritis, acute 12/29/2011   Hypokalemia 12/29/2011   Depression    MS (multiple sclerosis) (HCC) 12/28/2011   Fever 12/28/2011   DM2 (diabetes mellitus, type 2) (HCC) 12/28/2011    PAST MEDICAL HISTORY:  Active Ambulatory Problems    Diagnosis Date Noted   MS (multiple sclerosis) (HCC) 12/28/2011   Fever 12/28/2011   DM2 (diabetes mellitus, type 2) (HCC) 12/28/2011   Encephalopathy, metabolic 12/29/2011   Depression    Pyelonephritis, acute 12/29/2011   Hypokalemia 12/29/2011   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   Cerebrovascular disease 01/17/2015   Pulmonary embolism (HCC) 11/13/2017   Pulmonary emboli (HCC) 11/13/2017   DVT (deep venous thrombosis) Rt Leg 11/14/2017   Leukocytosis 11/17/2017   Sepsis (HCC) 11/17/2017   Resolved Ambulatory Problems    Diagnosis Date Noted   Mental status, decreased 12/28/2011   Past Medical History:  Diagnosis Date   Allergic rhinitis    Blood dyscrasia    Dementia (HCC)    Diabetes mellitus    Dysmetabolic syndrome X    Family history of anesthesia complication    Hypertension    Multiple sclerosis (HCC)    Neuropathic pain    Urinary incontinence    Vitamin D deficiency    SOCIAL HX:  Social History   Tobacco Use   Smoking status: Never   Smokeless tobacco: Never  Substance Use Topics   Alcohol use: Yes    Alcohol/week: 0.0 standard drinks of alcohol    Comment: former    FAMILY HX:  Family History  Problem Relation Age of Onset   Multiple sclerosis Brother       ALLERGIES: No Known Allergies   PERTINENT MEDICATIONS:  Outpatient Encounter Medications as of 10/19/2022  Medication Sig   acetaminophen (TYLENOL) 325 MG tablet Take 650 mg by mouth See admin instructions. Take 650 mg by mouth two times a day and DO NOT EXCEED 3,000 MG IN HOURS   ammonium lactate (LAC-HYDRIN) 12 % lotion Apply 1 application. topically every 30 (thirty) days.   ARTIFICIAL TEAR SOLUTION OP Place 1 drop into both eyes 2 (two) times daily.   atorvastatin (LIPITOR) 10 MG tablet Take 5 mg by mouth every evening.   atorvastatin (LIPITOR) 20 MG tablet Take 1 tablet (20 mg total) by mouth daily. (Patient  not taking: Reported on 10/21/2021)   bimatoprost (LUMIGAN) 0.01 % SOLN Place 1 drop into both eyes every evening.   cephALEXin (KEFLEX) 500 MG capsule Take 1 capsule (500 mg total) by mouth 2 (two) times daily. (Patient not taking: Reported on 10/21/2021)   cephALEXin (KEFLEX) 500 MG capsule Take 1 capsule (500 mg total) by mouth 4 (four) times daily.   Cholecalciferol (VITAMIN D3) 10 MCG (400 UNIT) CAPS Take 800 Units by mouth every evening.   cloNIDine (CATAPRES) 0.2 MG tablet Take 1 tablet (0.2 mg total) by mouth 3 (three) times daily. (Patient taking differently: Take 0.2 mg by mouth See admin instructions. Take 0.2 mg by mouth three times a day and hold for a Systolic reading less than 110)   Cranberry 450 MG TABS Take 450 mg by mouth 2 (two) times daily.   ferrous sulfate 325 (65 FE) MG tablet Take 1 tablet (325 mg total) by mouth daily with breakfast. (Patient taking differently: Take 325 mg by mouth daily with supper.)   gabapentin (NEURONTIN) 300 MG capsule Take 1 capsule (300  mg total) by mouth every 12 (twelve) hours. (Patient not taking: Reported on 12/30/2021)   gabapentin (NEURONTIN) 400 MG capsule Take 400 mg by mouth 3 (three) times daily.   hydrALAZINE (APRESOLINE) 25 MG tablet Take 1 tablet (25 mg total) by mouth 3 (three) times daily. (Patient taking differently: Take 25 mg by mouth See admin instructions. Take 25 mg by mouth three times a day and hold of the Systolic reading is less than 161)   HYDROcodone-acetaminophen (NORCO/VICODIN) 5-325 MG tablet Take 1-2 tablets by mouth every 6 (six) hours as needed for moderate pain. (Patient not taking: Reported on 10/21/2021)   metFORMIN (GLUCOPHAGE) 500 MG tablet Take 500 mg by mouth daily with breakfast.   metoprolol (TOPROL-XL) 200 MG 24 hr tablet Take 200 mg by mouth See admin instructions. Take 200 mg by mouth in the morning and hold if the heart rate is less than 60   metoprolol succinate (TOPROL-XL) 100 MG 24 hr tablet Take 1 tablet (100  mg total) by mouth daily. Take with or immediately following a meal. (Patient not taking: Reported on 10/21/2021)   metoprolol tartrate (LOPRESSOR) 100 MG tablet Take 100 mg by mouth 2 (two) times daily.   Rivaroxaban (XARELTO) 15 MG TABS tablet Take 1 tablet (15 mg total) by mouth 2 (two) times daily with a meal. (Patient not taking: Reported on 10/21/2021)   rivaroxaban (XARELTO) 20 MG TABS tablet Take 1 tablet (20 mg total) by mouth daily with supper.   sennosides-docusate sodium (SENOKOT-S) 8.6-50 MG tablet Take 2 tablets by mouth at bedtime.   SYSTANE BALANCE 0.6 % SOLN Place 1 drop into both eyes 2 (two) times daily.   No facility-administered encounter medications on file as of 10/19/2022.   I spent 35 minutes providing this consultation; this includes time spent with patient/family, chart review and documentation. More than 50% of the time in this consultation was spent on counseling and coordinating communication.    Thank you for the opportunity to participate in the care of [name].  The palliative care team will continue to follow. Please call our office at [phone] if we can be of additional assistance.   Rosaura Carpenter, NP

## 2023-01-02 ENCOUNTER — Other Ambulatory Visit: Payer: Self-pay

## 2023-01-02 ENCOUNTER — Emergency Department (HOSPITAL_COMMUNITY): Payer: BC Managed Care – PPO

## 2023-01-02 ENCOUNTER — Encounter (HOSPITAL_COMMUNITY): Payer: Self-pay | Admitting: Emergency Medicine

## 2023-01-02 ENCOUNTER — Inpatient Hospital Stay (HOSPITAL_COMMUNITY)
Admission: EM | Admit: 2023-01-02 | Discharge: 2023-01-06 | DRG: 871 | Disposition: A | Discharge status: Skilled Nursing Facility | Payer: BC Managed Care – PPO | Attending: Internal Medicine | Admitting: Internal Medicine

## 2023-01-02 DIAGNOSIS — A419 Sepsis, unspecified organism: Secondary | ICD-10-CM | POA: Diagnosis present

## 2023-01-02 DIAGNOSIS — R7989 Other specified abnormal findings of blood chemistry: Secondary | ICD-10-CM

## 2023-01-02 DIAGNOSIS — A4189 Other specified sepsis: Principal | ICD-10-CM | POA: Diagnosis present

## 2023-01-02 DIAGNOSIS — E119 Type 2 diabetes mellitus without complications: Secondary | ICD-10-CM

## 2023-01-02 DIAGNOSIS — R6520 Severe sepsis without septic shock: Secondary | ICD-10-CM | POA: Diagnosis present

## 2023-01-02 DIAGNOSIS — E8881 Metabolic syndrome: Secondary | ICD-10-CM | POA: Diagnosis present

## 2023-01-02 DIAGNOSIS — Z820 Family history of epilepsy and other diseases of the nervous system: Secondary | ICD-10-CM

## 2023-01-02 DIAGNOSIS — Z90710 Acquired absence of both cervix and uterus: Secondary | ICD-10-CM

## 2023-01-02 DIAGNOSIS — Z7984 Long term (current) use of oral hypoglycemic drugs: Secondary | ICD-10-CM

## 2023-01-02 DIAGNOSIS — Z6833 Body mass index (BMI) 33.0-33.9, adult: Secondary | ICD-10-CM

## 2023-01-02 DIAGNOSIS — R6510 Systemic inflammatory response syndrome (SIRS) of non-infectious origin without acute organ dysfunction: Secondary | ICD-10-CM | POA: Diagnosis not present

## 2023-01-02 DIAGNOSIS — I10 Essential (primary) hypertension: Secondary | ICD-10-CM | POA: Diagnosis present

## 2023-01-02 DIAGNOSIS — E669 Obesity, unspecified: Secondary | ICD-10-CM | POA: Diagnosis present

## 2023-01-02 DIAGNOSIS — G35 Multiple sclerosis: Secondary | ICD-10-CM | POA: Diagnosis present

## 2023-01-02 DIAGNOSIS — L89150 Pressure ulcer of sacral region, unstageable: Secondary | ICD-10-CM | POA: Diagnosis present

## 2023-01-02 DIAGNOSIS — G35D Multiple sclerosis, unspecified: Secondary | ICD-10-CM | POA: Diagnosis present

## 2023-01-02 DIAGNOSIS — R1310 Dysphagia, unspecified: Secondary | ICD-10-CM | POA: Diagnosis present

## 2023-01-02 DIAGNOSIS — Z7901 Long term (current) use of anticoagulants: Secondary | ICD-10-CM

## 2023-01-02 DIAGNOSIS — E785 Hyperlipidemia, unspecified: Secondary | ICD-10-CM

## 2023-01-02 DIAGNOSIS — F03C0 Unspecified dementia, severe, without behavioral disturbance, psychotic disturbance, mood disturbance, and anxiety: Secondary | ICD-10-CM | POA: Diagnosis present

## 2023-01-02 DIAGNOSIS — G9341 Metabolic encephalopathy: Secondary | ICD-10-CM | POA: Insufficient documentation

## 2023-01-02 DIAGNOSIS — Z86718 Personal history of other venous thrombosis and embolism: Secondary | ICD-10-CM

## 2023-01-02 DIAGNOSIS — D638 Anemia in other chronic diseases classified elsewhere: Secondary | ICD-10-CM | POA: Diagnosis present

## 2023-01-02 DIAGNOSIS — E872 Acidosis, unspecified: Secondary | ICD-10-CM | POA: Diagnosis present

## 2023-01-02 DIAGNOSIS — U071 COVID-19: Secondary | ICD-10-CM | POA: Diagnosis present

## 2023-01-02 DIAGNOSIS — Z7401 Bed confinement status: Secondary | ICD-10-CM

## 2023-01-02 DIAGNOSIS — Z86711 Personal history of pulmonary embolism: Secondary | ICD-10-CM

## 2023-01-02 DIAGNOSIS — Z79899 Other long term (current) drug therapy: Secondary | ICD-10-CM

## 2023-01-02 DIAGNOSIS — I2489 Other forms of acute ischemic heart disease: Secondary | ICD-10-CM | POA: Diagnosis present

## 2023-01-02 DIAGNOSIS — G8220 Paraplegia, unspecified: Secondary | ICD-10-CM | POA: Diagnosis present

## 2023-01-02 LAB — COMPREHENSIVE METABOLIC PANEL
ALT: 25 U/L (ref 0–44)
AST: 21 U/L (ref 15–41)
Albumin: 3.2 g/dL — ABNORMAL LOW (ref 3.5–5.0)
Alkaline Phosphatase: 56 U/L (ref 38–126)
Anion gap: 15 (ref 5–15)
BUN: 12 mg/dL (ref 8–23)
CO2: 21 mmol/L — ABNORMAL LOW (ref 22–32)
Calcium: 8.7 mg/dL — ABNORMAL LOW (ref 8.9–10.3)
Chloride: 102 mmol/L (ref 98–111)
Creatinine, Ser: 0.7 mg/dL (ref 0.44–1.00)
GFR, Estimated: >60 mL/min (ref >60)
Glucose, Bld: 191 mg/dL — ABNORMAL HIGH (ref 70–99)
Potassium: 3.8 mmol/L (ref 3.5–5.1)
Sodium: 138 mmol/L (ref 135–145)
Total Bilirubin: 0.2 mg/dL — ABNORMAL LOW (ref 0.3–1.2)
Total Protein: 6.9 g/dL (ref 6.5–8.1)

## 2023-01-02 LAB — CBC WITH DIFFERENTIAL/PLATELET
Abs Immature Granulocytes: 0.05 10*3/uL (ref 0.00–0.07)
Basophils Absolute: 0 10*3/uL (ref 0.0–0.1)
Basophils Relative: 0 %
Eosinophils Absolute: 0 10*3/uL (ref 0.0–0.5)
Eosinophils Relative: 0 %
HCT: 35.8 % — ABNORMAL LOW (ref 36.0–46.0)
Hemoglobin: 11.8 g/dL — ABNORMAL LOW (ref 12.0–15.0)
Immature Granulocytes: 1 %
Lymphocytes Relative: 12 %
Lymphs Abs: 0.9 10*3/uL (ref 0.7–4.0)
MCH: 27.2 pg (ref 26.0–34.0)
MCHC: 33 g/dL (ref 30.0–36.0)
MCV: 82.5 fL (ref 80.0–100.0)
Monocytes Absolute: 1 10*3/uL (ref 0.1–1.0)
Monocytes Relative: 14 %
Neutro Abs: 5.3 10*3/uL (ref 1.7–7.7)
Neutrophils Relative %: 73 %
Platelets: 193 10*3/uL (ref 150–400)
RBC: 4.34 MIL/uL (ref 3.87–5.11)
RDW: 13.9 % (ref 11.5–15.5)
WBC: 7.2 10*3/uL (ref 4.0–10.5)
nRBC: 0 % (ref 0.0–0.2)

## 2023-01-02 LAB — SARS CORONAVIRUS 2 BY RT PCR: SARS Coronavirus 2 by RT PCR: POSITIVE — AB

## 2023-01-02 LAB — I-STAT CG4 LACTIC ACID, ED: Lactic Acid, Venous: 2.5 mmol/L (ref 0.5–1.9)

## 2023-01-02 LAB — TROPONIN I (HIGH SENSITIVITY): Troponin I (High Sensitivity): 25 ng/L — ABNORMAL HIGH (ref <18)

## 2023-01-02 MED ORDER — LACTATED RINGERS IV BOLUS
1000.0000 mL | Freq: Once | INTRAVENOUS | Status: AC
  Administered 2023-01-02: 1000 mL via INTRAVENOUS

## 2023-01-02 MED ORDER — SODIUM CHLORIDE 0.9 % IV SOLN
2.0000 g | Freq: Once | INTRAVENOUS | Status: AC
  Administered 2023-01-02: 2 g via INTRAVENOUS
  Filled 2023-01-02: qty 12.5

## 2023-01-02 MED ORDER — VANCOMYCIN HCL 2000 MG/400ML IV SOLN
2000.0000 mg | Freq: Once | INTRAVENOUS | Status: AC
  Administered 2023-01-02: 2000 mg via INTRAVENOUS
  Filled 2023-01-02: qty 400

## 2023-01-02 NOTE — ED Notes (Signed)
Two sets of blood cultures sent to lab.

## 2023-01-02 NOTE — ED Triage Notes (Signed)
Patient BIB [facility] c/o possible sepsis.  EMS reports patient's roommate told them that she was "more quiet than normal."  Patient responsive to painful stimuli with wet cough.  20 g L FA 155/93 ETC02 40 HR 98 50 mL NaCl bolus 94% RA- 2 L [place] 99%

## 2023-01-02 NOTE — ED Provider Notes (Signed)
[place] EMERGENCY DEPARTMENT AT [facility] Provider Note   CSN: [id number] Arrival date & time: 01/02/23  2224     History  Chief Complaint  Patient presents with   Code Sepsis    [name] is a 67 y.o. female.  HPI   Patient with medical history including hypertension, MS, PE, DVT, currently on Xarelto, presenting from the nursing facility due to fevers, HPI will be deferred due to severity of illness.  Spoke with nursing staff at facility states that patient had a fever was tachycardic and had a borderline low blood pressure, patient is bedbound, states.  Typically patient is quiet but will communicate slightly, there is been no recent head trauma, no stomach pains nausea or vomiting been tolerating p.o., apparently tested negative at facility for COVID.  I reviewed patient's chart, currently being followed by palliative care but not in hospice at this time, has a MOST form most recent palliative care  visits states that she is a full code.   Home Medications Prior to Admission medications   Medication Sig Start Date End Date Taking? Authorizing Provider  acetaminophen (TYLENOL) 325 MG tablet Take 650 mg by mouth in the morning, at noon, and at bedtime. DO NOT EXCEED 3,000 MG IN HOURS   Yes [provider]  ammonium lactate (LAC-HYDRIN) 12 % lotion Apply 1 application. topically every 30 (thirty) days.   Yes [provider]  ARTIFICIAL TEAR SOLUTION OP Place 1 drop into both eyes 2 (two) times daily.   Yes [provider]  atorvastatin (LIPITOR) 10 MG tablet Take 5 mg by mouth every evening.   Yes [provider]  Cholecalciferol (VITAMIN D3) 10 MCG (400 UNIT) CAPS Take 800 Units by mouth every evening.   Yes [provider]  cloNIDine (CATAPRES) 0.1 MG tablet Take 0.1 mg by mouth See admin instructions. Give 1 tablet by mouth as needed for sBP greater than 180 or dBP greater than 95   Yes [provider]   Cranberry 450 MG TABS Take 450 mg by mouth 2 (two) times daily.   Yes [provider]  ferrous sulfate 325 (65 FE) MG tablet Take 1 tablet (325 mg total) by mouth daily with breakfast. Patient taking differently: Take 325 mg by mouth daily with supper. 11/15/17  Yes Emokpae, Courage, MD  gabapentin (NEURONTIN) 400 MG capsule Take 400 mg by mouth 3 (three) times daily.   Yes [provider]  latanoprost (XALATAN) 0.005 % ophthalmic solution Place 1 drop into both eyes at bedtime. 11/24/22  Yes [provider]  metFORMIN (GLUCOPHAGE) 500 MG tablet Take 500 mg by mouth daily with breakfast.   Yes [provider]  metoprolol tartrate (LOPRESSOR) 100 MG tablet Take 100 mg by mouth 2 (two) times daily. 03/15/22  Yes [provider]  polyethylene glycol (MIRALAX / GLYCOLAX) 17 g packet Take 17 g by mouth daily as needed for mild constipation.   Yes [provider]  rivaroxaban (XARELTO) 20 MG TABS tablet Take 1 tablet (20 mg total) by mouth daily with supper. 12/16/17  Yes Tyrone Nine, MD  sennosides-docusate sodium (SENOKOT-S) 8.6-50 MG tablet Take 2 tablets by mouth at bedtime.   Yes [provider]  SYSTANE BALANCE 0.6 % SOLN Place 1 drop into both eyes 2 (two) times daily.   Yes [provider]      Allergies    Patient has no known allergies.    Review of Systems  Review of Systems  Unable to perform ROS: Patient nonverbal    Physical Exam Updated Vital Signs BP (!) 137/95   Pulse (!) 106   Temp (!) 102.7 F (39.3 C) (Rectal)   Resp 18   Wt 80 kg   SpO2 99%   BMI 33.32 kg/m  Physical Exam Vitals and nursing note reviewed.  Constitutional:      General: She is not in acute distress.    Appearance: She is ill-appearing and toxic-appearing.     Comments: Deconditioned state, chronically ill-appearing, toxic appearance  HENT:     Head: Normocephalic and atraumatic.     Comments: No evidence of trauma no raccoon  eyes or Battle sign noted.    Nose: No congestion.     Mouth/Throat:     Mouth: Mucous membranes are moist.     Pharynx: Oropharynx is clear.  Eyes:     Conjunctiva/sclera: Conjunctivae normal.     Pupils: Pupils are equal, round, and reactive to light.     Comments: Disconjugate gaze, PERRLA  Cardiovascular:     Rate and Rhythm: Regular rhythm. Tachycardia present.     Pulses: Normal pulses.     Heart sounds: No murmur heard.    No friction rub. No gallop.  Pulmonary:     Effort: No respiratory distress.     Breath sounds: Rhonchi present. No wheezing or rales.     Comments: No evidence of acute respiratory distress, no accessory muscle usage, on room air, has bilateral rhonchi, without wheezing rales or stridor. Abdominal:     Palpations: Abdomen is soft.     Tenderness: There is no abdominal tenderness. There is no right CVA tenderness or left CVA tenderness.     Comments: Abdomen nondistended, soft, patient does slightly groan when palpation in the abdomen nonfocal eyes without guarding rebound tenderness or peritoneal sign.  Skin:    General: Skin is warm and dry.  Neurological:     Mental Status: She is alert.     Comments: Patient is responsive to stimuli, will groan when asked questions, there is no notable facial asymmetry, unable to assess strength in the upper extremities that she will not follow commands, patient appears to have decreased muscle tone in the lower extremities.  Patient is in a contracted state.  Psychiatric:        Mood and Affect: Mood normal.     ED Results / Procedures / Treatments   Labs (all labs ordered are listed, but only abnormal results are displayed) Labs Reviewed  SARS CORONAVIRUS 2 BY RT PCR - Abnormal; Notable for the following components:      Result Value   SARS Coronavirus 2 by RT PCR POSITIVE (*)    All other components within normal limits  CBC WITH DIFFERENTIAL/PLATELET - Abnormal; Notable for the following components:    Hemoglobin 11.8 (*)    HCT 35.8 (*)    All other components within normal limits  COMPREHENSIVE METABOLIC PANEL - Abnormal; Notable for the following components:   CO2 21 (*)    Glucose, Bld 191 (*)    Calcium 8.7 (*)    Albumin 3.2 (*)    Total Bilirubin 0.2 (*)    All other components within normal limits  URINALYSIS, ROUTINE W REFLEX MICROSCOPIC - Abnormal; Notable for the following components:   Color, Urine COLORLESS (*)    Hgb urine dipstick SMALL (*)    All other components within normal limits  I-STAT CG4 LACTIC ACID, ED -  Abnormal; Notable for the following components:   Lactic Acid, Venous 2.5 (*)    All other components within normal limits  I-STAT CG4 LACTIC ACID, ED - Abnormal; Notable for the following components:   Lactic Acid, Venous 2.3 (*)    All other components within normal limits  TROPONIN I (HIGH SENSITIVITY) - Abnormal; Notable for the following components:   Troponin I (High Sensitivity) 25 (*)    All other components within normal limits  TROPONIN I (HIGH SENSITIVITY) - Abnormal; Notable for the following components:   Troponin I (High Sensitivity) 30 (*)    All other components within normal limits  URINE CULTURE  LIPASE, BLOOD    EKG EKG Interpretation Date/Time:  Sunday January 02 2023 22:43:50 EDT Ventricular Rate:  96 PR Interval:  187 QRS Duration:  72 QT Interval:  353 QTC Calculation: 447 R Axis:   33  Text Interpretation: Sinus rhythm Abnormal R-wave progression, early transition LVH by voltage Confirmed by Tilden Fossa [phone]) on 01/02/2023 11:05:56 PM  Radiology CT CHEST ABDOMEN PELVIS W CONTRAST  Result Date: 01/03/2023 CLINICAL DATA:  Sepsis EXAM: CT CHEST, ABDOMEN, AND PELVIS WITH CONTRAST TECHNIQUE: Multidetector CT imaging of the chest, abdomen and pelvis was performed following the standard protocol during bolus administration of intravenous contrast. RADIATION DOSE REDUCTION: This exam was performed according to the  departmental dose-optimization program which includes automated exposure control, adjustment of the mA and/or kV according to patient size and/or use of iterative reconstruction technique. CONTRAST:  75mL OMNIPAQUE IOHEXOL 350 MG/ML SOLN COMPARISON:  04/15/2022 FINDINGS: CT CHEST FINDINGS Cardiovascular: Extensive multi-vessel coronary artery calcification. Global cardiac size within normal limits. No pericardial effusion. Central pulmonary arteries are of normal caliber. Mild dilation of the proximal descending thoracic aorta which measures 3.1 cm in diameter, stable since prior examination. Ascending aorta and distal descending aorta are of normal caliber. Moderate atherosclerotic calcification of the thoracic aorta. Mediastinum/Nodes: Stable subcentimeter hypoenhancing nodules within the right thyroid lobe which are unlikely of clinical significance and for which no follow-up imaging is recommended. No pathologic thoracic adenopathy. Esophagus unremarkable. Lungs/Pleura: Elevation of the left hemidiaphragm with associated left basilar atelectasis, unchanged. Lungs are otherwise clear. No pneumothorax or pleural effusion. Musculoskeletal: No chest wall mass or suspicious bone lesions identified. CT ABDOMEN PELVIS FINDINGS Hepatobiliary: Mild hepatic steatosis. No enhancing intrahepatic mass. No intra or extrahepatic biliary ductal dilation. Gallbladder unremarkable. Pancreas: Unremarkable. No pancreatic ductal dilatation or surrounding inflammatory changes. Spleen: Normal in size without focal abnormality. Adrenals/Urinary Tract: Adrenal glands are unremarkable. Kidneys are normal, without renal calculi, focal lesion, or hydronephrosis. Bladder is unremarkable. Stomach/Bowel: Stomach is within normal limits. Appendix absent. No evidence of bowel wall thickening, distention, or inflammatory changes. Vascular/Lymphatic: Aortic atherosclerosis. No enlarged abdominal or pelvic lymph nodes. Reproductive: Status post  hysterectomy. No adnexal masses. Other: No abdominal wall hernia or abnormality. No abdominopelvic ascites. Musculoskeletal: Osseous structures are age-appropriate. No acute bone abnormality. No lytic or blastic bone lesion. IMPRESSION: 1. No acute intrathoracic or intra-abdominal pathology identified. No definite radiographic explanation for the patient's reported symptoms. 2. Extensive multi-vessel coronary artery calcification. 3. Mild hepatic steatosis. 4. Aortic atherosclerosis. Aortic Atherosclerosis (ICD10-I70.0). Electronically Signed   By: Helyn Numbers M.D.   On: 01/03/2023 01:00   CT Head Wo Contrast  Result Date: 01/02/2023 CLINICAL DATA:  Mental status change, unknown cause.  Sepsis. EXAM: CT HEAD WITHOUT CONTRAST TECHNIQUE: Contiguous axial images were obtained from the base of the skull through the vertex without intravenous contrast. RADIATION DOSE  REDUCTION: This exam was performed according to the departmental dose-optimization program which includes automated exposure control, adjustment of the mA and/or kV according to patient size and/or use of iterative reconstruction technique. COMPARISON:  04/15/2022 FINDINGS: Brain: There is atrophy and chronic small vessel disease changes. No acute intracranial abnormality. Specifically, no hemorrhage, hydrocephalus, mass lesion, acute infarction, or significant intracranial injury. Vascular: No hyperdense vessel or unexpected calcification. Skull: No acute calvarial abnormality. Sinuses/Orbits: No acute findings Other: None IMPRESSION: Atrophy, chronic microvascular disease. No acute intracranial abnormality. Electronically Signed   By: Charlett Nose M.D.   On: 01/02/2023 23:42   DG Chest Portable 1 View  Result Date: 01/02/2023 CLINICAL DATA:  Possible sepsis.  Wet cough. EXAM: PORTABLE CHEST 1 VIEW COMPARISON:  Radiograph and CT 04/15/2022 FINDINGS: Stable cardiomediastinal silhouette. Similar elevation of the left hemidiaphragm and left basilar  atelectasis. The lungs are otherwise clear. No displaced rib fractures. IMPRESSION: No active disease. Electronically Signed   By: Minerva Fester M.D.   On: 01/02/2023 23:07    Procedures .Critical Care  Performed by: Carroll Sage, PA-C Authorized by: Carroll Sage, PA-C   Critical care provider statement:    Critical care time (minutes):  30   Critical care time was exclusive of:  Separately billable procedures and treating other patients   Critical care was necessary to treat or prevent imminent or life-threatening deterioration of the following conditions:  Sepsis   Critical care was time spent personally by me on the following activities:  Development of treatment plan with patient or surrogate, discussions with consultants, evaluation of patient's response to treatment, examination of patient, ordering and review of laboratory studies, ordering and review of radiographic studies, ordering and performing treatments and interventions, pulse oximetry, re-evaluation of patient's condition and review of old charts   I assumed direction of critical care for this patient from another provider in my specialty: no     Care discussed with: admitting provider       Medications Ordered in ED Medications  lactated ringers bolus 1,000 mL (0 mLs Intravenous Stopped 01/03/23 0205)  ceFEPIme (MAXIPIME) 2 g in sodium chloride 0.9 % 100 mL IVPB (0 g Intravenous Stopped 01/02/23 2346)  lactated ringers bolus 1,000 mL (0 mLs Intravenous Stopped 01/03/23 0205)  vancomycin (VANCOREADY) IVPB 2000 mg/400 mL (0 mg Intravenous Stopped 01/03/23 0234)  iohexol (OMNIPAQUE) 350 MG/ML injection 75 mL (75 mLs Intravenous Contrast Given 01/03/23 0049)    ED Course/ Medical Decision Making/ A&P                                 Medical Decision Making Amount and/or Complexity of Data Reviewed Radiology: ordered.  Risk Prescription drug management. Decision regarding hospitalization.   This patient  presents to the ED for concern of sepsis, this involves an extensive number of treatment options, and is a complaint that carries with it a high risk of complications and morbidity.  The differential diagnosis includes intracranial bleed, CVA, MS flareup, pneumonia, ACS, intra-abdominal infection    Additional history obtained:  Additional history obtained from nursing facility External records from outside source obtained and reviewed including palliative care notes, recent hospitalization   Co morbidities that complicate the patient evaluation  MS, on anticoagulants  Social Determinants of Health:  Paraplegia, bedbound    Lab Tests:  I Ordered, and personally interpreted labs.  The pertinent results include: CBC shows normocytic anemia hemoglobin 11.8, CMP  reveals CO2 of 21, glucose 191, calcium 8.7, albumin 3.2, first troponin is 25, she is COVID-positive,   Imaging Studies ordered:  I ordered imaging studies including CT head, chest x-ray, CT chest abdomen pelvis I independently visualized and interpreted imaging which showed CT head unremarkable, chest echo unremarkable, I agree with the radiologist interpretation   Cardiac Monitoring:  The patient was maintained on a cardiac monitor.  I personally viewed and interpreted the cardiac monitored which showed an underlying rhythm of: Sinus without signs of ischemia   Medicines ordered and prescription drug management:  I ordered medication including fluids I have reviewed the patients home medicines and have made adjustments as needed  Critical Interventions:  Meet sepsis criteria, will start on broad-spectrum antibiotics, On reassessment patient is more responsive, she opens her eyes to voice and is able to communicate in short phrases, vital signs have remained stable we will continue to monitor.   Reevaluation:  Patient was reassessed she is resting comfortably, she is protecting her oral airway, her CT head and  x-ray unremarkable, will send down for CT chest abdomen pelvis with contrast for further evaluation.   CT scan unremarkable, patient was reassessed she is now more alert, she is talking, not endorsing any complaints at this time I suspect patient's symptoms are secondary to COVID, will continue to monitor.  Consultations Obtained:  I requested consultation with the pharmacy,  and discussed lab and imaging findings as well as pertinent plan - they recommend: Patient does not meet criteria for remdesivir, patient could be started on Paxlovid but recent events would have to be determined. Spoke to Dr. Robby Sermon  who  will admit the patient.    Test Considered:  N/a    Rule out low suspicion for internal head bleed and or mass as CT imaging is negative for acute findings.  Low suspicion for CVA she has no focal deficit present my exam.  Suspicion for pneumonia, low at this time lung sounds are clear no evidence of infection seen on chest x-ray.  I doubt intra-abdominal abnormality abdomen soft nontender, CT imaging negative acute findings.  Suspicion for ACS is low at this time EKG without signs of ischemia, for troponin slightly elevated, but has plateaued, I suspect slight elevation is from demand ischemia from increase heart rate from respiratory illness.  Doubt PE presentation atypical etiology nontachypneic nonhypoxic, currently on Xarelto has not missed any dosages making this less likely.    Dispostion and problem list  After consideration of the diagnostic results and the patients response to treatment, I feel that the patent would benefit from admission.  SIRS-secondary due to COVID, will defer on antiviral treatment as she has no respiratory illness, feel risks outweigh benefits of starting on Paxlovid, will need continued symptom management.            Final Clinical Impression(s) / ED Diagnoses Final diagnoses:  SIRS (systemic inflammatory response syndrome) (HCC)   COVID    Rx / DC Orders ED Discharge Orders     None         Barnie Del 01/03/23 0313    Tilden Fossa, MD 01/05/23 [phone]

## 2023-01-02 NOTE — Progress Notes (Signed)
ED Pharmacy Antibiotic Sign Off An antibiotic consult was received from an ED provider for vancomycin per pharmacy dosing for sepsis. A chart review was completed to assess appropriateness.   The following one time order(s) were placed:   Cefepime 2g IV x1 per MD Vancomycin 2000 mg IV x1  Further antibiotic and/or antibiotic pharmacy consults should be ordered by the admitting provider if indicated.   Thank you for allowing pharmacy to be a part of this patient's care.   Arabella Merles, [facility]  Clinical Pharmacist 01/02/23 11:11 PM

## 2023-01-03 ENCOUNTER — Emergency Department (HOSPITAL_COMMUNITY): Payer: BC Managed Care – PPO

## 2023-01-03 DIAGNOSIS — R6520 Severe sepsis without septic shock: Secondary | ICD-10-CM

## 2023-01-03 DIAGNOSIS — Z86711 Personal history of pulmonary embolism: Secondary | ICD-10-CM | POA: Diagnosis not present

## 2023-01-03 DIAGNOSIS — Z7401 Bed confinement status: Secondary | ICD-10-CM | POA: Diagnosis not present

## 2023-01-03 DIAGNOSIS — E785 Hyperlipidemia, unspecified: Secondary | ICD-10-CM | POA: Diagnosis present

## 2023-01-03 DIAGNOSIS — R7989 Other specified abnormal findings of blood chemistry: Secondary | ICD-10-CM

## 2023-01-03 DIAGNOSIS — E8881 Metabolic syndrome: Secondary | ICD-10-CM | POA: Diagnosis present

## 2023-01-03 DIAGNOSIS — R1310 Dysphagia, unspecified: Secondary | ICD-10-CM | POA: Diagnosis present

## 2023-01-03 DIAGNOSIS — A419 Sepsis, unspecified organism: Secondary | ICD-10-CM | POA: Diagnosis not present

## 2023-01-03 DIAGNOSIS — G9341 Metabolic encephalopathy: Secondary | ICD-10-CM | POA: Diagnosis present

## 2023-01-03 DIAGNOSIS — Z6833 Body mass index (BMI) 33.0-33.9, adult: Secondary | ICD-10-CM | POA: Diagnosis not present

## 2023-01-03 DIAGNOSIS — U071 COVID-19: Secondary | ICD-10-CM

## 2023-01-03 DIAGNOSIS — E872 Acidosis, unspecified: Secondary | ICD-10-CM | POA: Diagnosis present

## 2023-01-03 DIAGNOSIS — D638 Anemia in other chronic diseases classified elsewhere: Secondary | ICD-10-CM | POA: Diagnosis present

## 2023-01-03 DIAGNOSIS — Z7901 Long term (current) use of anticoagulants: Secondary | ICD-10-CM | POA: Diagnosis not present

## 2023-01-03 DIAGNOSIS — Z86718 Personal history of other venous thrombosis and embolism: Secondary | ICD-10-CM | POA: Diagnosis not present

## 2023-01-03 DIAGNOSIS — G8220 Paraplegia, unspecified: Secondary | ICD-10-CM | POA: Diagnosis present

## 2023-01-03 DIAGNOSIS — I2489 Other forms of acute ischemic heart disease: Secondary | ICD-10-CM | POA: Diagnosis present

## 2023-01-03 DIAGNOSIS — E119 Type 2 diabetes mellitus without complications: Secondary | ICD-10-CM | POA: Diagnosis present

## 2023-01-03 DIAGNOSIS — E11641 Type 2 diabetes mellitus with hypoglycemia with coma: Secondary | ICD-10-CM | POA: Diagnosis not present

## 2023-01-03 DIAGNOSIS — I10 Essential (primary) hypertension: Secondary | ICD-10-CM | POA: Diagnosis present

## 2023-01-03 DIAGNOSIS — Z7984 Long term (current) use of oral hypoglycemic drugs: Secondary | ICD-10-CM | POA: Diagnosis not present

## 2023-01-03 DIAGNOSIS — L89150 Pressure ulcer of sacral region, unstageable: Secondary | ICD-10-CM | POA: Diagnosis present

## 2023-01-03 DIAGNOSIS — A4189 Other specified sepsis: Secondary | ICD-10-CM | POA: Diagnosis present

## 2023-01-03 DIAGNOSIS — G35 Multiple sclerosis: Secondary | ICD-10-CM

## 2023-01-03 DIAGNOSIS — F03C0 Unspecified dementia, severe, without behavioral disturbance, psychotic disturbance, mood disturbance, and anxiety: Secondary | ICD-10-CM | POA: Diagnosis present

## 2023-01-03 DIAGNOSIS — Z820 Family history of epilepsy and other diseases of the nervous system: Secondary | ICD-10-CM | POA: Diagnosis not present

## 2023-01-03 DIAGNOSIS — R6510 Systemic inflammatory response syndrome (SIRS) of non-infectious origin without acute organ dysfunction: Secondary | ICD-10-CM | POA: Diagnosis present

## 2023-01-03 DIAGNOSIS — E669 Obesity, unspecified: Secondary | ICD-10-CM | POA: Diagnosis present

## 2023-01-03 LAB — BLOOD CULTURE ID PANEL (REFLEXED) - BCID2

## 2023-01-03 LAB — GLUCOSE, CAPILLARY
Glucose-Capillary: 106 mg/dL — ABNORMAL HIGH (ref 70–99)
Glucose-Capillary: 111 mg/dL — ABNORMAL HIGH (ref 70–99)
Glucose-Capillary: 117 mg/dL — ABNORMAL HIGH (ref 70–99)

## 2023-01-03 LAB — URINALYSIS, ROUTINE W REFLEX MICROSCOPIC
Bacteria, UA: NONE SEEN
Bilirubin Urine: NEGATIVE
Glucose, UA: NEGATIVE mg/dL
Ketones, ur: NEGATIVE mg/dL
Leukocytes,Ua: NEGATIVE
Nitrite: NEGATIVE
Protein, ur: NEGATIVE mg/dL
Specific Gravity, Urine: 1.015 (ref 1.005–1.030)
pH: 7 (ref 5.0–8.0)

## 2023-01-03 LAB — CBG MONITORING, ED
Glucose-Capillary: 113 mg/dL — ABNORMAL HIGH (ref 70–99)
Glucose-Capillary: 122 mg/dL — ABNORMAL HIGH (ref 70–99)

## 2023-01-03 LAB — I-STAT CG4 LACTIC ACID, ED: Lactic Acid, Venous: 2.3 mmol/L (ref 0.5–1.9)

## 2023-01-03 LAB — TROPONIN I (HIGH SENSITIVITY): Troponin I (High Sensitivity): 30 ng/L — ABNORMAL HIGH (ref <18)

## 2023-01-03 LAB — LACTIC ACID, PLASMA
Lactic Acid, Venous: 2.5 mmol/L (ref 0.5–1.9)
Lactic Acid, Venous: 1.3 mmol/L (ref 0.5–1.9)

## 2023-01-03 LAB — AMMONIA: Ammonia: 17 umol/L (ref 9–35)

## 2023-01-03 LAB — MAGNESIUM: Magnesium: 1.7 mg/dL (ref 1.7–2.4)

## 2023-01-03 LAB — LIPASE, BLOOD: Lipase: 49 U/L (ref 11–51)

## 2023-01-03 MED ORDER — FERROUS SULFATE 325 (65 FE) MG PO TABS: 325.0000 mg | ORAL_TABLET | Freq: Every day | ORAL | Status: DC

## 2023-01-03 MED ORDER — ACETAMINOPHEN 650 MG RE SUPP: 650.0000 mg | Freq: Four times a day (QID) | RECTAL | Status: DC | PRN

## 2023-01-03 MED ORDER — SODIUM CHLORIDE 0.9 % IV SOLN
200.0000 mg | Freq: Once | INTRAVENOUS | Status: AC
  Administered 2023-01-03: 200 mg via INTRAVENOUS
  Filled 2023-01-03: qty 40

## 2023-01-03 MED ORDER — METOPROLOL TARTRATE 25 MG PO TABS
25.0000 mg | ORAL_TABLET | Freq: Two times a day (BID) | ORAL | Status: DC
  Administered 2023-01-03 – 2023-01-06 (×6): 25 mg via ORAL
  Filled 2023-01-03 (×6): qty 1

## 2023-01-03 MED ORDER — INSULIN ASPART 100 UNIT/ML IJ SOLN: 0.0000 [IU] | Freq: Three times a day (TID) | INTRAMUSCULAR | Status: DC

## 2023-01-03 MED ORDER — FERROUS SULFATE 325 (65 FE) MG PO TABS
325.0000 mg | ORAL_TABLET | Freq: Every day | ORAL | Status: DC
  Administered 2023-01-04 – 2023-01-05 (×2): 325 mg via ORAL
  Filled 2023-01-03 (×3): qty 1

## 2023-01-03 MED ORDER — ACETAMINOPHEN 325 MG PO TABS
650.0000 mg | ORAL_TABLET | Freq: Four times a day (QID) | ORAL | Status: DC | PRN
  Administered 2023-01-03 – 2023-01-05 (×3): 650 mg via ORAL
  Filled 2023-01-03 (×5): qty 2

## 2023-01-03 MED ORDER — POLYVINYL ALCOHOL 1.4 % OP SOLN
1.0000 [drp] | Freq: Two times a day (BID) | OPHTHALMIC | Status: DC
  Administered 2023-01-03 – 2023-01-06 (×6): 1 [drp] via OPHTHALMIC
  Filled 2023-01-03: qty 15

## 2023-01-03 MED ORDER — ATORVASTATIN CALCIUM 10 MG PO TABS: 5.0000 mg | ORAL_TABLET | Freq: Every evening | ORAL | Status: DC

## 2023-01-03 MED ORDER — RIVAROXABAN 10 MG PO TABS: 20.0000 mg | ORAL_TABLET | Freq: Every day | ORAL | Status: DC

## 2023-01-03 MED ORDER — SODIUM CHLORIDE 0.9 % IV SOLN: INTRAVENOUS | Status: AC

## 2023-01-03 MED ORDER — ENOXAPARIN SODIUM 60 MG/0.6ML IJ SOSY
60.0000 mg | PREFILLED_SYRINGE | Freq: Two times a day (BID) | INTRAMUSCULAR | Status: DC
  Administered 2023-01-03 – 2023-01-04 (×2): 60 mg via SUBCUTANEOUS
  Filled 2023-01-03 (×2): qty 0.6

## 2023-01-03 MED ORDER — LATANOPROST 0.005 % OP SOLN
1.0000 [drp] | Freq: Every day | OPHTHALMIC | Status: DC
  Administered 2023-01-03 – 2023-01-05 (×3): 1 [drp] via OPHTHALMIC
  Filled 2023-01-03: qty 2.5

## 2023-01-03 MED ORDER — METOPROLOL TARTRATE 25 MG PO TABS
100.0000 mg | ORAL_TABLET | Freq: Two times a day (BID) | ORAL | Status: DC
  Administered 2023-01-03: 100 mg via ORAL
  Filled 2023-01-03: qty 4

## 2023-01-03 MED ORDER — IOHEXOL 350 MG/ML SOLN
75.0000 mL | Freq: Once | INTRAVENOUS | Status: AC | PRN
  Administered 2023-01-03: 75 mL via INTRAVENOUS

## 2023-01-03 MED ORDER — BISACODYL 10 MG RE SUPP
10.0000 mg | Freq: Every day | RECTAL | Status: DC | PRN
  Administered 2023-01-06: 10 mg via RECTAL
  Filled 2023-01-03: qty 1

## 2023-01-03 MED ORDER — ATORVASTATIN CALCIUM 10 MG PO TABS
5.0000 mg | ORAL_TABLET | Freq: Every evening | ORAL | Status: DC
  Administered 2023-01-04 – 2023-01-05 (×2): 5 mg via ORAL
  Filled 2023-01-03 (×3): qty 1

## 2023-01-03 MED ORDER — SENNOSIDES-DOCUSATE SODIUM 8.6-50 MG PO TABS: 2.0000 | ORAL_TABLET | Freq: Every day | ORAL | Status: DC

## 2023-01-03 MED ORDER — INSULIN ASPART 100 UNIT/ML IJ SOLN: 0.0000 [IU] | Freq: Every day | INTRAMUSCULAR | Status: DC

## 2023-01-03 MED ORDER — SODIUM CHLORIDE 0.9 % IV SOLN
100.0000 mg | Freq: Every day | INTRAVENOUS | Status: AC
  Administered 2023-01-04 – 2023-01-05 (×2): 100 mg via INTRAVENOUS
  Filled 2023-01-03 (×2): qty 20

## 2023-01-03 MED ORDER — INSULIN ASPART 100 UNIT/ML IJ SOLN
0.0000 [IU] | INTRAMUSCULAR | Status: DC
  Administered 2023-01-03 – 2023-01-04 (×2): 1 [IU] via SUBCUTANEOUS
  Administered 2023-01-04: 2 [IU] via SUBCUTANEOUS
  Administered 2023-01-04 (×2): 3 [IU] via SUBCUTANEOUS
  Administered 2023-01-05: 2 [IU] via SUBCUTANEOUS
  Administered 2023-01-05: 1 [IU] via SUBCUTANEOUS
  Administered 2023-01-05: 2 [IU] via SUBCUTANEOUS
  Administered 2023-01-06 (×2): 1 [IU] via SUBCUTANEOUS

## 2023-01-03 MED ORDER — METOPROLOL TARTRATE 5 MG/5ML IV SOLN: 2.5000 mg | Freq: Four times a day (QID) | INTRAVENOUS | Status: DC

## 2023-01-03 MED ORDER — ENOXAPARIN SODIUM 80 MG/0.8ML IJ SOSY: 80.0000 mg | PREFILLED_SYRINGE | Freq: Two times a day (BID) | INTRAMUSCULAR | Status: DC

## 2023-01-03 NOTE — Evaluation (Signed)
Clinical/Bedside Swallow Evaluation Patient Details  Name: [name] MRN: [id number] Date of Birth: 05/21/1955  Today's Date: 01/03/2023 Time: SLP Start Time (ACUTE ONLY): 1420 SLP Stop Time (ACUTE ONLY): 1440 SLP Time Calculation (min) (ACUTE ONLY): 20 min  Past Medical History:  Past Medical History:  Diagnosis Date   Allergic rhinitis    Blood dyscrasia    Dementia (HCC)    Depression    Diabetes mellitus    Dysmetabolic syndrome X    Family history of anesthesia complication    brother "   Hypertension    Hypokalemia    Multiple sclerosis (HCC)    Neuropathic pain    Urinary incontinence    Vitamin D deficiency    Past Surgical History:  Past Surgical History:  Procedure Laterality Date   ABDOMINAL HYSTERECTOMY     CESAREAN SECTION     HPI:  Pt is a 67 yo female presenting from SNF 8/18 with sepsis secondary to COVID. PMH includes: dysphagia (most recent swallow eval from 2013 was functional but mech soft was recommended as she did not have her dentures), dementia, advanced MS with severe functional impairment (bedbound and dependent for all ADLs at baseline), DMII, HTN, HLD, DVT/PE, chronic anemia, depression    Assessment / Plan / Recommendation  Clinical Impression  Patient is presenting with clinical s/s of dysphagia as per this bedside swallow evaluation. With PO's of thin liquids (water) and puree solids (applesauce), she exhibitied delayed initiation of swallowing but no overt s/s aspiration. She was total assist with feeding but able to drink through straw and full oral clearance of PO's. SLP will follow briefly to ensure PO toleration and determine if any ability to advance with solids. SLP Visit Diagnosis: Dysphagia, unspecified (R13.10)    Aspiration Risk  Mild aspiration risk    Diet Recommendation Dysphagia 1 (Puree);Thin liquid    Liquid Administration via: Cup;Straw Medication Administration: Whole meds with puree Supervision: Full  supervision/cueing for compensatory strategies Compensations: Slow rate;Small sips/bites Postural Changes: Other (Comment) (seated as upright as tolerated)    Other  Recommendations Oral Care Recommendations: Oral care BID    Recommendations for follow up therapy are one component of a multi-disciplinary discharge planning process, led by the attending physician.  Recommendations may be updated based on patient status, additional functional criteria and insurance authorization.  Follow up Recommendations Skilled nursing-short term rehab (<3 hours/day)      Assistance Recommended at Discharge    Functional Status Assessment Patient has had a recent decline in their functional status and demonstrates the ability to make significant improvements in function in a reasonable and predictable amount of time.  Frequency and Duration min 2x/week  1 week       Prognosis Prognosis for improved oropharyngeal function: Fair Barriers to Reach Goals: Cognitive deficits Barriers/Prognosis Comment: puree solids may be her baseline      Swallow Study   General Date of Onset: 01/03/23 HPI: Pt is a 67 yo female presenting from SNF 8/18 with sepsis secondary to COVID. PMH includes: dysphagia (most recent swallow eval from 2013 was functional but mech soft was recommended as she did not have her dentures), dementia, advanced MS with severe functional impairment (bedbound and dependent for all ADLs at baseline), DMII, HTN, HLD, DVT/PE, chronic anemia, depression Type of Study: Bedside Swallow Evaluation Previous Swallow Assessment: very remote (2013) Diet Prior to this Study: NPO Temperature Spikes Noted: No Respiratory Status: Room air History of Recent Intubation: No Behavior/Cognition: Alert;Cooperative;Pleasant mood Oral  Cavity Assessment: Within Functional Limits Oral Care Completed by SLP: Yes Oral Cavity - Dentition: Edentulous Self-Feeding Abilities: Total assist Patient Positioning:  Partially reclined;Other (comment) (bed tilted up as patient indicated pain when elevating HOB) Baseline Vocal Quality: Normal;Low vocal intensity Volitional Cough: Cognitively unable to elicit Volitional Swallow: Unable to elicit    Oral/Motor/Sensory Function Overall Oral Motor/Sensory Function: Within functional limits   Ice Chips     Thin Liquid Thin Liquid: Impaired Presentation: Straw Oral Phase Functional Implications: Oral holding Pharyngeal  Phase Impairments: Suspected delayed Swallow    Nectar Thick     Honey Thick     Puree Puree: Impaired Pharyngeal Phase Impairments: Suspected delayed Swallow   Solid     Solid: Not tested     Angela Nevin, MA, CCC-SLP Speech Therapy

## 2023-01-03 NOTE — Progress Notes (Signed)
Pharmacy Brief Note - Evening Anticoagulation Follow Up:  Bed weight obtained is 61.9 kg. Will adjust enoxaparin dose to 60 mg subQ q12h.   Cindi Carbon, PharmD 01/03/23 3:46 PM

## 2023-01-03 NOTE — Progress Notes (Signed)
     Referral received for [name]: goals of care discussion. Chart reviewed and updates received from RN. Attempted to contact patient's sister [name]. Unable to reach. Voicemail left with contact information given.   I was able to leave a message for the patient's sister [name] with the patient's brother-in-law, requesting she call PMT at her earliest convenience.  PMT will re-attempt to contact family at a later time/date. Detailed note and recommendations to follow once GOC has been completed.   Thank you for your referral and allowing PMT to assist in [name]'s care.   Sarina Ser, NP Palliative Medicine Team  Team Phone # [phone]   NO CHARGE

## 2023-01-03 NOTE — ED Notes (Signed)
Mrs. Jyl Heinz gave verbal permission to nurse secretary to have patient admitted to [facility]

## 2023-01-03 NOTE — ED Notes (Signed)
Patient to CT.

## 2023-01-03 NOTE — H&P (Signed)
History and Physical    [name] ZOX:[id number] DOB: 1955-10-22 DOA: 01/02/2023  PCP: Florentina Jenny, MD  Patient coming from: Nursing home  Chief Complaint: Fevers  HPI: [name] is a 67 y.o. female with medical history significant of dementia, advanced MS with severe functional impairment/bedbound at baseline/dependent for all ADLs/nursing home resident, bedbound, dysphagia, type 2 diabetes, hypertension, hyperlipidemia, DVT/PE on Xarelto, chronic anemia, depression presenting from her nursing facility for evaluation of fevers, tachycardia, and less responsive than usual.  Patient is currently confused and not able to give much information.  She is endorsing cough but has no other complaints.  Denies shortness of breath, chest pain, abdominal pain, vomiting, or diarrhea.  ED Course: Temperature 102.7 F and slightly tachycardic.  Not hypotensive or hypoxic.  Labs showing no leukocytosis, hemoglobin 11.8 (stable), bicarb 21, glucose 191, creatinine 0.7, troponin 25> 30, SARS-CoV-2 PCR positive, lactic acid 2.5> 2.3, UA not suggestive of infection, urine culture pending.  CT head/chest/abdomen/pelvis with contrast negative for acute finding.  EKG without acute ischemic changes.  Patient received vancomycin, cefepime, and 2 L LR.  Review of Systems:  Review of Systems  All other systems reviewed and are negative.   Past Medical History:  Diagnosis Date   Allergic rhinitis    Blood dyscrasia    Dementia (HCC)    Depression    Diabetes mellitus    Dysmetabolic syndrome X    Family history of anesthesia complication    brother "   Hypertension    Hypokalemia    Multiple sclerosis (HCC)    Neuropathic pain    Urinary incontinence    Vitamin D deficiency     Past Surgical History:  Procedure Laterality Date   ABDOMINAL HYSTERECTOMY     CESAREAN SECTION       reports that she has never smoked. She has never used smokeless tobacco. She reports current alcohol use. She  reports that she does not use drugs.  No Known Allergies  Family History  Problem Relation Age of Onset   Multiple sclerosis Brother     Prior to Admission medications   Medication Sig Start Date End Date Taking? Authorizing Provider  acetaminophen (TYLENOL) 325 MG tablet Take 650 mg by mouth in the morning, at noon, and at bedtime. DO NOT EXCEED 3,000 MG IN HOURS   Yes [provider]  ammonium lactate (LAC-HYDRIN) 12 % lotion Apply 1 application. topically every 30 (thirty) days.   Yes [provider]  ARTIFICIAL TEAR SOLUTION OP Place 1 drop into both eyes 2 (two) times daily.   Yes [provider]  atorvastatin (LIPITOR) 10 MG tablet Take 5 mg by mouth every evening.   Yes [provider]  Cholecalciferol (VITAMIN D3) 10 MCG (400 UNIT) CAPS Take 800 Units by mouth every evening.   Yes [provider]  cloNIDine (CATAPRES) 0.1 MG tablet Take 0.1 mg by mouth See admin instructions. Give 1 tablet by mouth as needed for sBP greater than 180 or dBP greater than 95   Yes [provider]  Cranberry 450 MG TABS Take 450 mg by mouth 2 (two) times daily.   Yes [provider]  ferrous sulfate 325 (65 FE) MG tablet Take 1 tablet (325 mg total) by mouth daily with breakfast. Patient taking differently: Take 325 mg by mouth daily with supper. 11/15/17  Yes Emokpae, Courage, MD  gabapentin (NEURONTIN) 400 MG capsule Take 400 mg by mouth 3 (three) times daily.  Yes [provider]  latanoprost (XALATAN) 0.005 % ophthalmic solution Place 1 drop into both eyes at bedtime. 11/24/22  Yes [provider]  metFORMIN (GLUCOPHAGE) 500 MG tablet Take 500 mg by mouth daily with breakfast.   Yes [provider]  metoprolol tartrate (LOPRESSOR) 100 MG tablet Take 100 mg by mouth 2 (two) times daily. 03/15/22  Yes [provider]  polyethylene glycol (MIRALAX / GLYCOLAX) 17 g packet Take 17 g by mouth daily as needed for  mild constipation.   Yes [provider]  rivaroxaban (XARELTO) 20 MG TABS tablet Take 1 tablet (20 mg total) by mouth daily with supper. 12/16/17  Yes Tyrone Nine, MD  sennosides-docusate sodium (SENOKOT-S) 8.6-50 MG tablet Take 2 tablets by mouth at bedtime.   Yes [provider]  SYSTANE BALANCE 0.6 % SOLN Place 1 drop into both eyes 2 (two) times daily.   Yes [provider]    Physical Exam: Vitals:   01/02/23 2300 01/02/23 2315 01/02/23 2330 01/03/23 0045  BP: (!) 155/90 (!) 147/84 (!) 155/91 (!) 137/95  Pulse: 95 95 100 (!) 106  Resp: 17 18 17 18   Temp:      TempSrc:      SpO2: 99% 98% 99% 99%  Weight:        Physical Exam Vitals reviewed.  Constitutional:      General: She is not in acute distress. HENT:     Head: Normocephalic and atraumatic.  Eyes:     Extraocular Movements: Extraocular movements intact.  Cardiovascular:     Rate and Rhythm: Normal rate and regular rhythm.     Pulses: Normal pulses.  Pulmonary:     Effort: Pulmonary effort is normal. No respiratory distress.     Breath sounds: Normal breath sounds. No wheezing or rales.  Abdominal:     General: Bowel sounds are normal. There is no distension.     Palpations: Abdomen is soft.     Tenderness: There is no abdominal tenderness.  Musculoskeletal:     Cervical back: Normal range of motion.     Right lower leg: No edema.     Left lower leg: No edema.  Skin:    General: Skin is warm and dry.  Neurological:     Mental Status: She is alert.     Comments: Oriented to person and place only Confused Moving bilateral upper extremities spontaneously     Labs on Admission: I have personally reviewed following labs and imaging studies  CBC: Recent Labs  Lab 01/02/23 2246  WBC 7.2  NEUTROABS 5.3  HGB 11.8*  HCT 35.8*  MCV 82.5  PLT 193   Basic Metabolic Panel: Recent Labs  Lab 01/02/23 2246  NA 138  K 3.8  CL 102  CO2 21*  GLUCOSE 191*  BUN 12  CREATININE  0.70  CALCIUM 8.7*   GFR: CrCl cannot be calculated (Unknown ideal weight.). Liver Function Tests: Recent Labs  Lab 01/02/23 2246  AST 21  ALT 25  ALKPHOS 56  BILITOT 0.2*  PROT 6.9  ALBUMIN 3.2*   No results for input(s): "LIPASE", "AMYLASE" in the last 168 hours. No results for input(s): "AMMONIA" in the last 168 hours. Coagulation Profile: No results for input(s): "INR", "PROTIME" in the last 168 hours. Cardiac Enzymes: No results for input(s): "CKTOTAL", "CKMB", "CKMBINDEX", "TROPONINI" in the last 168 hours. BNP (last 3 results) No results for input(s): "PROBNP" in the last 8760 hours. HbA1C: No results for input(s): "  HGBA1C" in the last 72 hours. CBG: No results for input(s): "GLUCAP" in the last 168 hours. Lipid Profile: No results for input(s): "CHOL", "HDL", "LDLCALC", "TRIG", "CHOLHDL", "LDLDIRECT" in the last 72 hours. Thyroid Function Tests: No results for input(s): "TSH", "T4TOTAL", "FREET4", "T3FREE", "THYROIDAB" in the last 72 hours. Anemia Panel: No results for input(s): "VITAMINB12", "FOLATE", "FERRITIN", "TIBC", "IRON", "RETICCTPCT" in the last 72 hours. Urine analysis:    Component Value Date/Time   COLORURINE YELLOW 02/26/2022 1739   APPEARANCEUR HAZY (A) 02/26/2022 1739   LABSPEC 1.015 02/26/2022 1739   PHURINE 5.0 02/26/2022 1739   GLUCOSEU NEGATIVE 02/26/2022 1739   HGBUR SMALL (A) 02/26/2022 1739   BILIRUBINUR NEGATIVE 02/26/2022 1739   KETONESUR NEGATIVE 02/26/2022 1739   PROTEINUR NEGATIVE 02/26/2022 1739   UROBILINOGEN 1.0 12/28/2011 1634   NITRITE POSITIVE (A) 02/26/2022 1739   LEUKOCYTESUR SMALL (A) 02/26/2022 1739    Radiological Exams on Admission: CT CHEST ABDOMEN PELVIS W CONTRAST  Result Date: 01/03/2023 CLINICAL DATA:  Sepsis EXAM: CT CHEST, ABDOMEN, AND PELVIS WITH CONTRAST TECHNIQUE: Multidetector CT imaging of the chest, abdomen and pelvis was performed following the standard protocol during bolus administration of  intravenous contrast. RADIATION DOSE REDUCTION: This exam was performed according to the departmental dose-optimization program which includes automated exposure control, adjustment of the mA and/or kV according to patient size and/or use of iterative reconstruction technique. CONTRAST:  75mL OMNIPAQUE IOHEXOL 350 MG/ML SOLN COMPARISON:  04/15/2022 FINDINGS: CT CHEST FINDINGS Cardiovascular: Extensive multi-vessel coronary artery calcification. Global cardiac size within normal limits. No pericardial effusion. Central pulmonary arteries are of normal caliber. Mild dilation of the proximal descending thoracic aorta which measures 3.1 cm in diameter, stable since prior examination. Ascending aorta and distal descending aorta are of normal caliber. Moderate atherosclerotic calcification of the thoracic aorta. Mediastinum/Nodes: Stable subcentimeter hypoenhancing nodules within the right thyroid lobe which are unlikely of clinical significance and for which no follow-up imaging is recommended. No pathologic thoracic adenopathy. Esophagus unremarkable. Lungs/Pleura: Elevation of the left hemidiaphragm with associated left basilar atelectasis, unchanged. Lungs are otherwise clear. No pneumothorax or pleural effusion. Musculoskeletal: No chest wall mass or suspicious bone lesions identified. CT ABDOMEN PELVIS FINDINGS Hepatobiliary: Mild hepatic steatosis. No enhancing intrahepatic mass. No intra or extrahepatic biliary ductal dilation. Gallbladder unremarkable. Pancreas: Unremarkable. No pancreatic ductal dilatation or surrounding inflammatory changes. Spleen: Normal in size without focal abnormality. Adrenals/Urinary Tract: Adrenal glands are unremarkable. Kidneys are normal, without renal calculi, focal lesion, or hydronephrosis. Bladder is unremarkable. Stomach/Bowel: Stomach is within normal limits. Appendix absent. No evidence of bowel wall thickening, distention, or inflammatory changes. Vascular/Lymphatic: Aortic  atherosclerosis. No enlarged abdominal or pelvic lymph nodes. Reproductive: Status post hysterectomy. No adnexal masses. Other: No abdominal wall hernia or abnormality. No abdominopelvic ascites. Musculoskeletal: Osseous structures are age-appropriate. No acute bone abnormality. No lytic or blastic bone lesion. IMPRESSION: 1. No acute intrathoracic or intra-abdominal pathology identified. No definite radiographic explanation for the patient's reported symptoms. 2. Extensive multi-vessel coronary artery calcification. 3. Mild hepatic steatosis. 4. Aortic atherosclerosis. Aortic Atherosclerosis (ICD10-I70.0). Electronically Signed   By: Helyn Numbers M.D.   On: 01/03/2023 01:00   CT Head Wo Contrast  Result Date: 01/02/2023 CLINICAL DATA:  Mental status change, unknown cause.  Sepsis. EXAM: CT HEAD WITHOUT CONTRAST TECHNIQUE: Contiguous axial images were obtained from the base of the skull through the vertex without intravenous contrast. RADIATION DOSE REDUCTION: This exam was performed according to the departmental dose-optimization program which includes automated exposure control, adjustment of  the mA and/or kV according to patient size and/or use of iterative reconstruction technique. COMPARISON:  04/15/2022 FINDINGS: Brain: There is atrophy and chronic small vessel disease changes. No acute intracranial abnormality. Specifically, no hemorrhage, hydrocephalus, mass lesion, acute infarction, or significant intracranial injury. Vascular: No hyperdense vessel or unexpected calcification. Skull: No acute calvarial abnormality. Sinuses/Orbits: No acute findings Other: None IMPRESSION: Atrophy, chronic microvascular disease. No acute intracranial abnormality. Electronically Signed   By: Charlett Nose M.D.   On: 01/02/2023 23:42   DG Chest Portable 1 View  Result Date: 01/02/2023 CLINICAL DATA:  Possible sepsis.  Wet cough. EXAM: PORTABLE CHEST 1 VIEW COMPARISON:  Radiograph and CT 04/15/2022 FINDINGS: Stable  cardiomediastinal silhouette. Similar elevation of the left hemidiaphragm and left basilar atelectasis. The lungs are otherwise clear. No displaced rib fractures. IMPRESSION: No active disease. Electronically Signed   By: Minerva Fester M.D.   On: 01/02/2023 23:07    EKG: Independently reviewed.  Sinus rhythm, no acute ischemic changes.  Assessment and Plan  Sepsis secondary to COVID infection Meets criteria for sepsis with fever, tachycardia, and lactic acidosis.  Not hypotensive.  SARS-CoV-2 PCR positive.  No evidence of pneumonia on CT chest.  No other obvious infectious source.  No indication for steroids at this time.  Paxlovid contraindicated as she is on Xarelto.  Remdesivir ordered as she is high risk given age and medical comorbidities.  Patient was given antibiotics in the ED but no obvious source of bacterial infection at this time.  No leukocytosis on labs.  Check procalcitonin level and continue antibiotics if procalcitonin elevated.  Order blood cultures.  Tylenol as needed for fevers.  Patient is still slightly tachycardic after 2 L IV fluids, continue IV fluid hydration and trend lactate.  Acute metabolic encephalopathy Likely due to acute viral illness.  She has dementia and her facility reported that she has been less responsive than usual.  CT head negative for acute finding.  UA not suggestive of infection.  Patient currently appears confused, oriented to person and place only.  She is moving her upper extremities spontaneously but not able to move both of her lower extremities.  Does have history of advanced MS with severe functional impairment/bedbound at baseline.  Will check TSH and ammonia level.  Consider brain MRI if not improving.  Elevated troponin Troponin mildly elevated (25>30) and EKG without acute ischemic changes.  Patient denies chest pain.  Mild troponin elevation is likely due to demand ischemia rather than ACS.  Type 2 diabetes Check A1c, sensitive sliding  scale insulin.  MS Continue supportive care.  Outpatient neurology follow-up.  History of dysphagia Aspiration precautions, SLP eval.  Hypertension Blood pressure stable.  Continue metoprolol.  Hyperlipidemia Continue Lipitor.  History of DVT/PE Continue Xarelto.  Chronic anemia Hemoglobin stable.  DVT prophylaxis: Xarelto Code Status: Unable to discuss CODE STATUS with the patient at this time as she is confused.  No family available at this time.  Per review of palliative care note from 10/19/2022, she is full code. Level of care: Progressive Care Unit Admission status: It is my clinical opinion that referral for OBSERVATION is reasonable and necessary in this patient based on the above information provided. The aforementioned taken together are felt to place the patient at high risk for further clinical deterioration. However, it is anticipated that the patient may be medically stable for discharge from the hospital within 24 to 48 hours.  John Giovanni MD [facility]  If 7PM-7AM, please contact night-coverage [email or web address]  01/03/2023, 2:53 AM

## 2023-01-03 NOTE — Progress Notes (Signed)
ANTICOAGULATION CONSULT NOTE - Initial Consult  Pharmacy Consult for Enoxaparin Indication: DVT  No Known Allergies  Patient Measurements: Height: 5\' 7"  (170.2 cm) Weight: 80 kg (176 lb 5.9 oz) IBW/kg (Calculated) : 61.6  Vital Signs: Temp: 101.9 F (38.8 C) (08/19 1029) Temp Source: Oral (08/19 1029) BP: 117/72 (08/19 1015) Pulse Rate: 103 (08/19 1015)  Labs: Recent Labs    01/02/23 2246 01/03/23 0209  HGB 11.8*  --   HCT 35.8*  --   PLT 193  --   CREATININE 0.70  --   TROPONINIHS 25* 30*    Estimated Creatinine Clearance: 74.3 mL/min (by C-G formula based on SCr of 0.7 mg/dL).   Medical History: Past Medical History:  Diagnosis Date   Allergic rhinitis    Blood dyscrasia    Dementia (HCC)    Depression    Diabetes mellitus    Dysmetabolic syndrome X    Family history of anesthesia complication    brother "   Hypertension    Hypokalemia    Multiple sclerosis (HCC)    Neuropathic pain    Urinary incontinence    Vitamin D deficiency     Assessment: 13 YOF admitted for COVID pneumonia on Xarelto PTA for history of VTE. Currently unable to swallow pills. Pharmacy consulted to dose enoxaparin until swallow eval completed.  Patient unable to say height - historical height of 67 inches added to chart for now. Anticipate CrCl to remain >30 mL/min if updated  Last dose of Xarelto 8/18 PM  Goal of Therapy:  Anti-Xa level 0.6-1 units/ml 4hrs after LMWH dose given Monitor platelets by anticoagulation protocol: Yes   Plan:  Start enoxaparin 80mg  SQ BID tonight given last Xarelto dose F/u speech evaluation and ability to restart Xarelto  Eldridge Scot, PharmD, BCCCP Clinical Pharmacist 01/03/2023, 10:40 AM

## 2023-01-03 NOTE — Progress Notes (Signed)
  Bedside nurse noticed persistent elevation of lactic acid 2.5.  Per chart review patient just has been admitted at 3 AM due to sepsis secondary to COVID-19 infection.  Patient is hemodynamically stable most recent blood pressure 132/91.  Heart rate 100, respiratory rate 18 and O2 sat 100% room air.  In the ED patient has been resuscitated with 2 L of LR bolus and received cefepime and vancomycin in the ED. Currently on NS 125 cc/h for 12 hours.  Blood culture is in process.  Currently patient is treating with remdesivir. - Recommended bedside nurse to continue the NS at the same rate for now and will recheck lactic acid around 9 AM.  If the lactic acid continuing to trending up or patient develops hypotension need to give IV bolus and in that case will need to continue IV fluid beyond 12 hours.  Tereasa Coop, MD [facility] 01/03/2023, 5:19 AM

## 2023-01-03 NOTE — ED Notes (Signed)
Transfer of care report given to Tess, Charity fundraiser at [employer].

## 2023-01-03 NOTE — Progress Notes (Signed)
PROGRESS NOTE  [name] OZH:[id number] DOB: Oct 09, 1955   PCP: Florentina Jenny, MD  Patient is from: Nursing home  DOA: 01/02/2023 LOS: 0  Chief complaints Chief Complaint  Patient presents with   Code Sepsis     Brief Narrative / Interim history: 67 year old F with PMH of severe dementia, advanced MS, bedbound, dysphagia, PE/DVT on Xarelto, DM-2, HTN and HLD brought to ED from nursing home due to fever, tachycardia and decreased responsiveness, and found to have severe sepsis in the setting of COVID-19 infection.  No hypoxia or hypotension.  Lactic acid slightly elevated.  UA negative.  Urine culture and blood culture obtained.  Received cefepime and vancomycin in ED.  CT head, chest, abdomen and pelvis without acute finding.  Started on remdesivir and IV fluid, and admitted for overnight observation.  Patient is awake and alert but only oriented to self.  Has no complaints and responds no to pain, shortness of breath, cough, GI or UTI symptoms.  Subjective: Seen and examined earlier this morning.  No major events overnight of this morning.  Spiked fever to 102.4.  Slightly tachycardic but normotensive.  On room air.  Objective: Vitals:   01/03/23 0615 01/03/23 0700 01/03/23 0815 01/03/23 1000  BP: 134/81 (!) 146/81  132/82  Pulse: (!) 102 (!) 110  (!) 104  Resp: 16 15  19   Temp:   (!) 102.4 F (39.1 C)   TempSrc:   Oral   SpO2: 100% 100%  100%  Weight:        Examination:  GENERAL: No apparent distress.  Nontoxic. HEENT: MMM.  Somewhat impaired vision.  Hearing grossly intact. NECK: Supple.  No apparent JVD.  RESP: On room air.  No IWOB.  Fair aeration bilaterally. CVS: Slightly tachycardic to 100.  Heart sounds normal.  ABD/GI/GU: BS+. Abd soft, NTND.  MSK/EXT: Contractures in all extremities.  Significant muscle mass and subcu fat loss. SKIN: no apparent skin lesion or wound but not able to assess backside. NEURO: Awake and alert.  Oriented to self.  Contracted  extremities. PSYCH: Calm. Normal affect.   Procedures:  None  Microbiology summarized: COVID-19 PCR positive Urine culture and blood culture pending  Assessment and plan: Principal Problem:   COVID-19 virus infection Active Problems:   MS (multiple sclerosis) (HCC)   DM2 (diabetes mellitus, type 2) (HCC)   Acute metabolic encephalopathy   Sepsis (HCC)   Elevated troponin   HLD (hyperlipidemia)  Severe sepsis due to COVID-19 infection: POA.  Has fever, tachycardia, lactic acidosis and mental status change.  COVID-19 PCR positive.  No hypoxemia or respiratory symptoms.  No leukocytosis.  Blood pressure stable. -Continue remdesivir and gentle IV fluid -IV heparin while NPO.  She is on Xarelto at home. -Follow cultures, procalcitonin and lactic acid -Hold off further antibiotics for now. -Palliative consulted for goals of care discussion -Supportive care   Acute metabolic encephalopathy: Likely due to COVID infection with underlying dementia.  Awake and alert but only oriented to self.  Unknown baseline.  CT head without acute finding. -Manage COVID as above -Reorientation and delirium precaution -Aspiration precaution -SLP eval    Elevated troponin: Mild.  Likely demand ischemia in the setting of COVID-19 infection -Doubt need for further workup.     MS-seems advanced.  Notable extremity contractures.  Reportedly bedbound and dependent for all ADLS at baseline. -Continue supportive care.  -Palliative medicine consulted -Outpatient neurology follow-up.  NIDDM-2: No recent A1c.  Seems to be on metformin at home. Recent  Labs  Lab 01/03/23 0505 01/03/23 0850  GLUCAP 113* 122*  -Check hemoglobin A1c -Continue current insulin regimen   History of dysphagia-unknown severity.  I do not see PEG tube on exam. -Aspiration precaution -N.p.o. pending SLP eval -IV fluid   Essential hypertension: Normotensive -Continue home meds when able to take p.o.    Hyperlipidemia -Continue Lipitor.   History of DVT/PE -IV heparin while NPO.   Chronic anemia -Follow morning labs.  Obesity Body mass index is 33.32 kg/m.          DVT prophylaxis:  rivaroxaban (XARELTO) tablet 20 mg Start: 01/03/23 1700 rivaroxaban (XARELTO) tablet 20 mg  Code Status: Full code. Family Communication: Attempted to call patient's sisters but no answer. Level of care: Progressive Status is: Observation The patient will require care spanning > 2 midnights and should be moved to inpatient because: Severe sepsis in setting of COVID-19 infection and encephalopathy   Final disposition: To be determined Consultants:  Palliative  55 minutes with more than 50% spent in reviewing records, counseling patient/family and coordinating care.   Sch Meds:  Scheduled Meds:  atorvastatin  5 mg Oral QPM   ferrous sulfate  325 mg Oral Q supper   insulin aspart  0-9 Units Subcutaneous Q4H   latanoprost  1 drop Both Eyes QHS   metoprolol tartrate  100 mg Oral BID   Propylene Glycol  1 drop Both Eyes BID   rivaroxaban  20 mg Oral Q supper   senna-docusate  2 tablet Oral QHS   Continuous Infusions:  sodium chloride 125 mL/hr at 01/03/23 0422   [START ON 01/04/2023] remdesivir 100 mg in sodium chloride 0.9 % 100 mL IVPB     PRN Meds:.acetaminophen **OR** acetaminophen  Antimicrobials: Anti-infectives (From admission, onward)    Start     Dose/Rate Route Frequency Ordered Stop   01/04/23 1000  remdesivir 100 mg in sodium chloride 0.9 % 100 mL IVPB       Placed in "Followed by" Linked Group   100 mg 200 mL/hr over 30 Minutes Intravenous Daily 01/03/23 0354 01/06/23 0959   01/03/23 0600  remdesivir 200 mg in sodium chloride 0.9% 250 mL IVPB       Placed in "Followed by" Linked Group   200 mg 580 mL/hr over 30 Minutes Intravenous Once 01/03/23 0354 01/03/23 0836   01/02/23 2315  vancomycin (VANCOREADY) IVPB 2000 mg/400 mL        2,000 mg 200 mL/hr over 120 Minutes  Intravenous  Once 01/02/23 2313 01/03/23 0234   01/02/23 2300  ceFEPIme (MAXIPIME) 2 g in sodium chloride 0.9 % 100 mL IVPB        2 g 200 mL/hr over 30 Minutes Intravenous  Once 01/02/23 2251 01/02/23 2346        I have personally reviewed the following labs and images: CBC: Recent Labs  Lab 01/02/23 2246  WBC 7.2  NEUTROABS 5.3  HGB 11.8*  HCT 35.8*  MCV 82.5  PLT 193   BMP &GFR Recent Labs  Lab 01/02/23 2246  NA 138  K 3.8  CL 102  CO2 21*  GLUCOSE 191*  BUN 12  CREATININE 0.70  CALCIUM 8.7*   CrCl cannot be calculated (Unknown ideal weight.). Liver & Pancreas: Recent Labs  Lab 01/02/23 2246  AST 21  ALT 25  ALKPHOS 56  BILITOT 0.2*  PROT 6.9  ALBUMIN 3.2*   Recent Labs  Lab 01/03/23 0209  LIPASE 49   Recent Labs  Lab  01/03/23 0416  AMMONIA 17   Diabetic: No results for input(s): "HGBA1C" in the last 72 hours. Recent Labs  Lab 01/03/23 0505 01/03/23 0850  GLUCAP 113* 122*   Cardiac Enzymes: No results for input(s): "CKTOTAL", "CKMB", "CKMBINDEX", "TROPONINI" in the last 168 hours. No results for input(s): "PROBNP" in the last 8760 hours. Coagulation Profile: No results for input(s): "INR", "PROTIME" in the last 168 hours. Thyroid Function Tests: No results for input(s): "TSH", "T4TOTAL", "FREET4", "T3FREE", "THYROIDAB" in the last 72 hours. Lipid Profile: No results for input(s): "CHOL", "HDL", "LDLCALC", "TRIG", "CHOLHDL", "LDLDIRECT" in the last 72 hours. Anemia Panel: No results for input(s): "VITAMINB12", "FOLATE", "FERRITIN", "TIBC", "IRON", "RETICCTPCT" in the last 72 hours. Urine analysis:    Component Value Date/Time   COLORURINE COLORLESS (A) 01/03/2023 0225   APPEARANCEUR CLEAR 01/03/2023 0225   LABSPEC 1.015 01/03/2023 0225   PHURINE 7.0 01/03/2023 0225   GLUCOSEU NEGATIVE 01/03/2023 0225   HGBUR SMALL (A) 01/03/2023 0225   BILIRUBINUR NEGATIVE 01/03/2023 0225   KETONESUR NEGATIVE 01/03/2023 0225   PROTEINUR NEGATIVE  01/03/2023 0225   UROBILINOGEN 1.0 12/28/2011 1634   NITRITE NEGATIVE 01/03/2023 0225   LEUKOCYTESUR NEGATIVE 01/03/2023 0225   Sepsis Labs: Invalid input(s): "PROCALCITONIN", "LACTICIDVEN"  Microbiology: Recent Results (from the past 240 hour(s))  SARS Coronavirus 2 by RT PCR (hospital order, performed in [facility] hospital lab) *cepheid single result test* Anterior Nasal Swab     Status: Abnormal   Collection Time: 01/02/23 10:48 PM   Specimen: Anterior Nasal Swab  Result Value Ref Range Status   SARS Coronavirus 2 by RT PCR POSITIVE (A) NEGATIVE Final    Comment: Performed at [facility], [address], [place], [place] [address]    Radiology Studies: CT CHEST ABDOMEN PELVIS W CONTRAST  Result Date: 01/03/2023 CLINICAL DATA:  Sepsis EXAM: CT CHEST, ABDOMEN, AND PELVIS WITH CONTRAST TECHNIQUE: Multidetector CT imaging of the chest, abdomen and pelvis was performed following the standard protocol during bolus administration of intravenous contrast. RADIATION DOSE REDUCTION: This exam was performed according to the departmental dose-optimization program which includes automated exposure control, adjustment of the mA and/or kV according to patient size and/or use of iterative reconstruction technique. CONTRAST:  75mL OMNIPAQUE IOHEXOL 350 MG/ML SOLN COMPARISON:  04/15/2022 FINDINGS: CT CHEST FINDINGS Cardiovascular: Extensive multi-vessel coronary artery calcification. Global cardiac size within normal limits. No pericardial effusion. Central pulmonary arteries are of normal caliber. Mild dilation of the proximal descending thoracic aorta which measures 3.1 cm in diameter, stable since prior examination. Ascending aorta and distal descending aorta are of normal caliber. Moderate atherosclerotic calcification of the thoracic aorta. Mediastinum/Nodes: Stable subcentimeter hypoenhancing nodules within the right thyroid lobe which are unlikely of clinical significance and for which no  follow-up imaging is recommended. No pathologic thoracic adenopathy. Esophagus unremarkable. Lungs/Pleura: Elevation of the left hemidiaphragm with associated left basilar atelectasis, unchanged. Lungs are otherwise clear. No pneumothorax or pleural effusion. Musculoskeletal: No chest wall mass or suspicious bone lesions identified. CT ABDOMEN PELVIS FINDINGS Hepatobiliary: Mild hepatic steatosis. No enhancing intrahepatic mass. No intra or extrahepatic biliary ductal dilation. Gallbladder unremarkable. Pancreas: Unremarkable. No pancreatic ductal dilatation or surrounding inflammatory changes. Spleen: Normal in size without focal abnormality. Adrenals/Urinary Tract: Adrenal glands are unremarkable. Kidneys are normal, without renal calculi, focal lesion, or hydronephrosis. Bladder is unremarkable. Stomach/Bowel: Stomach is within normal limits. Appendix absent. No evidence of bowel wall thickening, distention, or inflammatory changes. Vascular/Lymphatic: Aortic atherosclerosis. No enlarged abdominal or pelvic lymph nodes. Reproductive:  Status post hysterectomy. No adnexal masses. Other: No abdominal wall hernia or abnormality. No abdominopelvic ascites. Musculoskeletal: Osseous structures are age-appropriate. No acute bone abnormality. No lytic or blastic bone lesion. IMPRESSION: 1. No acute intrathoracic or intra-abdominal pathology identified. No definite radiographic explanation for the patient's reported symptoms. 2. Extensive multi-vessel coronary artery calcification. 3. Mild hepatic steatosis. 4. Aortic atherosclerosis. Aortic Atherosclerosis (ICD10-I70.0). Electronically Signed   By: Helyn Numbers M.D.   On: 01/03/2023 01:00   CT Head Wo Contrast  Result Date: 01/02/2023 CLINICAL DATA:  Mental status change, unknown cause.  Sepsis. EXAM: CT HEAD WITHOUT CONTRAST TECHNIQUE: Contiguous axial images were obtained from the base of the skull through the vertex without intravenous contrast. RADIATION DOSE  REDUCTION: This exam was performed according to the departmental dose-optimization program which includes automated exposure control, adjustment of the mA and/or kV according to patient size and/or use of iterative reconstruction technique. COMPARISON:  04/15/2022 FINDINGS: Brain: There is atrophy and chronic small vessel disease changes. No acute intracranial abnormality. Specifically, no hemorrhage, hydrocephalus, mass lesion, acute infarction, or significant intracranial injury. Vascular: No hyperdense vessel or unexpected calcification. Skull: No acute calvarial abnormality. Sinuses/Orbits: No acute findings Other: None IMPRESSION: Atrophy, chronic microvascular disease. No acute intracranial abnormality. Electronically Signed   By: Charlett Nose M.D.   On: 01/02/2023 23:42   DG Chest Portable 1 View  Result Date: 01/02/2023 CLINICAL DATA:  Possible sepsis.  Wet cough. EXAM: PORTABLE CHEST 1 VIEW COMPARISON:  Radiograph and CT 04/15/2022 FINDINGS: Stable cardiomediastinal silhouette. Similar elevation of the left hemidiaphragm and left basilar atelectasis. The lungs are otherwise clear. No displaced rib fractures. IMPRESSION: No active disease. Electronically Signed   By: Minerva Fester M.D.   On: 01/02/2023 23:07      Rockey Guarino T. Raford Brissett [facility]  If 7PM-7AM, please contact night-coverage [email or web address] 01/03/2023, 10:14 AM

## 2023-01-04 DIAGNOSIS — U071 COVID-19: Secondary | ICD-10-CM | POA: Diagnosis not present

## 2023-01-04 LAB — CBC
HCT: 35.4 % — ABNORMAL LOW (ref 36.0–46.0)
Hemoglobin: 11.3 g/dL — ABNORMAL LOW (ref 12.0–15.0)
MCH: 25.7 pg — ABNORMAL LOW (ref 26.0–34.0)
MCHC: 31.9 g/dL (ref 30.0–36.0)
MCV: 80.6 fL (ref 80.0–100.0)
Platelets: 184 10*3/uL (ref 150–400)
RBC: 4.39 MIL/uL (ref 3.87–5.11)
RDW: 13.8 % (ref 11.5–15.5)
WBC: 5.3 10*3/uL (ref 4.0–10.5)
nRBC: 0 % (ref 0.0–0.2)

## 2023-01-04 LAB — COMPREHENSIVE METABOLIC PANEL
ALT: 23 U/L (ref 0–44)
AST: 20 U/L (ref 15–41)
Albumin: 3.4 g/dL — ABNORMAL LOW (ref 3.5–5.0)
Alkaline Phosphatase: 50 U/L (ref 38–126)
Anion gap: 10 (ref 5–15)
BUN: 17 mg/dL (ref 8–23)
CO2: 20 mmol/L — ABNORMAL LOW (ref 22–32)
Calcium: 8.3 mg/dL — ABNORMAL LOW (ref 8.9–10.3)
Chloride: 104 mmol/L (ref 98–111)
Creatinine, Ser: 0.72 mg/dL (ref 0.44–1.00)
GFR, Estimated: >60 mL/min (ref >60)
Glucose, Bld: 139 mg/dL — ABNORMAL HIGH (ref 70–99)
Potassium: 3.5 mmol/L (ref 3.5–5.1)
Sodium: 134 mmol/L — ABNORMAL LOW (ref 135–145)
Total Bilirubin: 0.6 mg/dL (ref 0.3–1.2)
Total Protein: 6.9 g/dL (ref 6.5–8.1)

## 2023-01-04 LAB — PROCALCITONIN: Procalcitonin: 0.11 ng/mL

## 2023-01-04 LAB — GLUCOSE, CAPILLARY
Glucose-Capillary: 116 mg/dL — ABNORMAL HIGH (ref 70–99)
Glucose-Capillary: 130 mg/dL — ABNORMAL HIGH (ref 70–99)
Glucose-Capillary: 137 mg/dL — ABNORMAL HIGH (ref 70–99)
Glucose-Capillary: 171 mg/dL — ABNORMAL HIGH (ref 70–99)
Glucose-Capillary: 200 mg/dL — ABNORMAL HIGH (ref 70–99)
Glucose-Capillary: 222 mg/dL — ABNORMAL HIGH (ref 70–99)

## 2023-01-04 LAB — MAGNESIUM: Magnesium: 1.9 mg/dL (ref 1.7–2.4)

## 2023-01-04 MED ORDER — GABAPENTIN 400 MG PO CAPS
400.0000 mg | ORAL_CAPSULE | Freq: Three times a day (TID) | ORAL | Status: DC
  Administered 2023-01-04 – 2023-01-06 (×6): 400 mg via ORAL
  Filled 2023-01-04 (×6): qty 1

## 2023-01-04 MED ORDER — SODIUM CHLORIDE 0.9 % IV SOLN: INTRAVENOUS | Status: DC

## 2023-01-04 MED ORDER — RIVAROXABAN 20 MG PO TABS
20.0000 mg | ORAL_TABLET | Freq: Every day | ORAL | Status: DC
  Administered 2023-01-04 – 2023-01-05 (×2): 20 mg via ORAL
  Filled 2023-01-04 (×2): qty 1

## 2023-01-04 MED ORDER — METOPROLOL TARTRATE 5 MG/5ML IV SOLN
5.0000 mg | Freq: Four times a day (QID) | INTRAVENOUS | Status: DC | PRN
  Administered 2023-01-04: 5 mg via INTRAVENOUS
  Filled 2023-01-04: qty 5

## 2023-01-04 NOTE — Plan of Care (Signed)
  Problem: Tissue Perfusion: Goal: Adequacy of tissue perfusion will improve Outcome: Progressing   

## 2023-01-04 NOTE — Progress Notes (Addendum)
PROGRESS NOTE    [name]  GMW:[id number] DOB: [birth year] DOA: 01/02/2023 PCP: Florentina Jenny, MD   Brief Narrative:67 year old F with PMH of severe dementia, advanced MS, bedbound, dysphagia, PE/DVT on Xarelto, DM-2, HTN and HLD brought to ED from nursing home due to fever, tachycardia and decreased responsiveness, and found to have severe sepsis in the setting of COVID-19 infection.  No hypoxia or hypotension.  Lactic acid slightly elevated.  UA negative.  Urine culture and blood culture obtained.  Received cefepime and vancomycin in ED.  CT head, chest, abdomen and pelvis without acute finding.  Started on remdesivir and IV fluid   Assessment & Plan:   Principal Problem:   COVID-19 virus infection Active Problems:   MS (multiple sclerosis) (HCC)   DM2 (diabetes mellitus, type 2) (HCC)   Acute metabolic encephalopathy   Severe sepsis (HCC)   Elevated troponin   HLD (hyperlipidemia)  #1 sepsis secondary to COVID-19 she met criteria for sepsis on admission with fever lactic acidosis and tachycardia and COVID-positive.  Patient was not hypoxic.  CT chest showed no evidence of consolidation.  Steroids were not started since she was not hypoxic. Remdesivir was ordered though since she is at high risk given her numerous medical comorbidities and age. Procalcitonin normal she received antibiotics in the ER but have not been continued. Lactic acidosis resolving. She was not given Paxlovid as it was contraindicated while she is on Xarelto. CRP pending Normal white count She is still intermittently tachycardic with soft blood pressure Tmax 100.1 Continue remdesivir  #2 acute metabolic encephalopathy in the setting of dementia and COVID CT head showed no acute finding  UA no infection  #3 type 2 diabetes continue SSI, on metformin prior to admission A1c 6.1 CBG (last 3)  Recent Labs    01/04/23 0503 01/04/23 0814 01/04/23 1224  GLUCAP 137* 130* 222*     #4 history of MS  outpatient follow-up  #5 history of DVT and PE on Xarelto  #6 history of dysphagia seen by speech recommending dysphagia 1 diet  7 history of hypertension blood pressures after 102/78 on metoprolol, Catapres hold for now.  #8 hyperlipidemia on Lipitor  #9 mildly elevated troponin with no acute EKG changes patient denies chest pain or shortness of breath   Pressure Injury Coccyx Right;Left Unstageable - Full thickness tissue loss in which the base of the injury is covered by slough (yellow, tan, gray, green or brown) and/or eschar (tan, brown or black) in the wound bed. (Active)     Location: Coccyx  Location Orientation: Right;Left  Staging: Unstageable - Full thickness tissue loss in which the base of the injury is covered by slough (yellow, tan, gray, green or brown) and/or eschar (tan, brown or black) in the wound bed.  Wound Description (Comments):   Present on Admission: Yes  Dressing Type Foam - Lift dressing to assess site every shift 01/03/23 2155    Estimated body mass index is 21.37 kg/m as calculated from the following:   Height as of this encounter: 5\' 7"  (1.702 m).   Weight as of this encounter: 61.9 kg.  DVT prophylaxis: Xarelto Code Status: Full code Family Communication: Called all the 3 numbers that is available in the chart one of them is wrong the other 2 did not pick up it kept ringing  disposition Plan:  Status is: Inpatient Remains inpatient appropriate because:    Consultants: none  Procedures:none Antimicrobials:remdesvir  Subjective:  Resting in bed awake answers ?? appropriately  Objective: Vitals:   01/04/23 0506 01/04/23 0628 01/04/23 1030 01/04/23 1227  BP: 131/85  117/75 102/78  Pulse:   (!) 125 (!) 130  Resp: 18     Temp: (!) 100.6 F (38.1 C) 99.1 F (37.3 C)  100.1 F (37.8 C)  TempSrc: Oral Oral  Oral  SpO2: 93%   91%  Weight:      Height:        Intake/Output Summary (Last 24 hours) at 01/04/2023 1253 Last data filed at  01/04/2023 1000 Gross per 24 hour  Intake 330 ml  Output --  Net 330 ml   Filed Weights   01/02/23 2243 01/03/23 1456  Weight: 80 kg 61.9 kg    Examination:  General exam: Appears calm and comfortable  Respiratory system: few rhonchi to auscultation. Respiratory effort normal. Cardiovascular system: reg Gastrointestinal system: Abdomen is nondistended, soft and nontender. No organomegaly or masses felt. Normal bowel sounds heard. Central nervous system: Alert and oriented. No focal neurological deficits. Extremities: contracted trace edema  Data Reviewed: I have personally reviewed following labs and imaging studies  CBC: Recent Labs  Lab 01/02/23 2246 01/04/23 0425  WBC 7.2 5.3  NEUTROABS 5.3  --   HGB 11.8* 11.3*  HCT 35.8* 35.4*  MCV 82.5 80.6  PLT 193 184   Basic Metabolic Panel: Recent Labs  Lab 01/02/23 2246 01/03/23 1148 01/04/23 0425  NA 138  --  134*  K 3.8  --  3.5  CL 102  --  104  CO2 21*  --  20*  GLUCOSE 191*  --  139*  BUN 12  --  17  CREATININE 0.70  --  0.72  CALCIUM 8.7*  --  8.3*  MG  --  1.7 1.9   GFR: Estimated Creatinine Clearance: 66.4 mL/min (by C-G formula based on SCr of 0.72 mg/dL). Liver Function Tests: Recent Labs  Lab 01/02/23 2246 01/04/23 0425  AST 21 20  ALT 25 23  ALKPHOS 56 50  BILITOT 0.2* 0.6  PROT 6.9 6.9  ALBUMIN 3.2* 3.4*   Recent Labs  Lab 01/03/23 0209  LIPASE 49   Recent Labs  Lab 01/03/23 0416  AMMONIA 17   Coagulation Profile: No results for input(s): "INR", "PROTIME" in the last 168 hours. Cardiac Enzymes: No results for input(s): "CKTOTAL", "CKMB", "CKMBINDEX", "TROPONINI" in the last 168 hours. BNP (last 3 results) No results for input(s): "PROBNP" in the last 8760 hours. HbA1C: No results for input(s): "HGBA1C" in the last 72 hours. CBG: Recent Labs  Lab 01/03/23 2008 01/03/23 2348 01/04/23 0503 01/04/23 0814 01/04/23 1224  GLUCAP 111* 117* 137* 130* 222*   Lipid Profile: No  results for input(s): "CHOL", "HDL", "LDLCALC", "TRIG", "CHOLHDL", "LDLDIRECT" in the last 72 hours. Thyroid Function Tests: No results for input(s): "TSH", "T4TOTAL", "FREET4", "T3FREE", "THYROIDAB" in the last 72 hours. Anemia Panel: No results for input(s): "VITAMINB12", "FOLATE", "FERRITIN", "TIBC", "IRON", "RETICCTPCT" in the last 72 hours. Sepsis Labs: Recent Labs  Lab 01/02/23 2254 01/03/23 0214 01/03/23 0416 01/03/23 1148 01/04/23 0425  PROCALCITON  --   --   --   --  0.11  LATICACIDVEN 2.5* 2.3* 2.5* 1.3  --     Recent Results (from the past 240 hour(s))  Culture, blood (Routine X 2) w Reflex to ID Panel     Status: None (Preliminary result)   Collection Time: 01/02/23 10:46 PM   Specimen: BLOOD LEFT HAND  Result Value Ref Range Status   Specimen Description BLOOD  LEFT HAND  Final   Special Requests   Final    BOTTLES DRAWN AEROBIC AND ANAEROBIC Blood Culture adequate volume   Culture   Final    NO GROWTH 1 DAY Performed at [facility], [address] [place], [place] [address]    Report Status PENDING  Incomplete  SARS Coronavirus 2 by RT PCR (hospital order, performed in [facility] hospital lab) *cepheid single result test* Anterior Nasal Swab     Status: Abnormal   Collection Time: 01/02/23 10:48 PM   Specimen: Anterior Nasal Swab  Result Value Ref Range Status   SARS Coronavirus 2 by RT PCR POSITIVE (A) NEGATIVE Final    Comment: Performed at [facility], [address] [place], [place] [address]  Urine Culture     Status: Abnormal (Preliminary result)   Collection Time: 01/03/23  2:25 AM   Specimen: Urine, Clean Catch  Result Value Ref Range Status   Specimen Description URINE, CLEAN CATCH  Final   Special Requests NONE  Final   Culture (A)  Final    >=100,000 COLONIES/mL ENTEROCOCCUS FAECALIS SUSCEPTIBILITIES TO FOLLOW Performed at [facility], [address], [place], [place] [address]    Report Status PENDING  Incomplete   Culture, blood (Routine X 2) w Reflex to ID Panel     Status: Abnormal (Preliminary result)   Collection Time: 01/03/23  4:00 AM   Specimen: BLOOD  Result Value Ref Range Status   Specimen Description BLOOD RIGHT ANTECUBITAL  Final   Special Requests   Final    BOTTLES DRAWN AEROBIC AND ANAEROBIC Blood Culture results may not be optimal due to an excessive volume of blood received in culture bottles   Culture  Setup Time   Final    GRAM POSITIVE COCCI IN CLUSTERS IN BOTH AEROBIC AND ANAEROBIC BOTTLES CRITICAL RESULT CALLED TO, READ BACK BY AND VERIFIED WITH: RN Gwenith Spitz [phone] 2245 FH    Culture (A)  Final    STAPHYLOCOCCUS EPIDERMIDIS THE SIGNIFICANCE OF ISOLATING THIS ORGANISM FROM A SINGLE SET OF BLOOD CULTURES WHEN MULTIPLE SETS ARE DRAWN IS UNCERTAIN. PLEASE NOTIFY THE MICROBIOLOGY DEPARTMENT WITHIN ONE WEEK IF SPECIATION AND SENSITIVITIES ARE REQUIRED. Performed at [facility], [address] [place], [place] [address]    Report Status PENDING  Incomplete  Blood Culture ID Panel (Reflexed)     Status: Abnormal   Collection Time: 01/03/23  4:00 AM  Result Value Ref Range Status   Enterococcus faecalis NOT DETECTED NOT DETECTED Final   Enterococcus Faecium NOT DETECTED NOT DETECTED Final   Listeria monocytogenes NOT DETECTED NOT DETECTED Final   Staphylococcus species DETECTED (A) NOT DETECTED Final    Comment: CRITICAL RESULT CALLED TO, READ BACK BY AND VERIFIED WITH: RN Gwenith Spitz [id number]@ 2245 FH    Staphylococcus aureus (BCID) NOT DETECTED NOT DETECTED Final   Staphylococcus epidermidis DETECTED (A) NOT DETECTED Final    Comment: Methicillin (oxacillin) resistant coagulase negative staphylococcus. Possible blood culture contaminant (unless isolated from more than one blood culture draw or clinical case suggests pathogenicity). No antibiotic treatment is indicated for blood  culture contaminants. CRITICAL RESULT CALLED TO, READ BACK BY AND VERIFIED WITH: RN Gwenith Spitz [phone]@ 2245 FH    Staphylococcus lugdunensis NOT DETECTED NOT DETECTED Final   Streptococcus species NOT DETECTED NOT DETECTED Final   Streptococcus agalactiae NOT DETECTED NOT DETECTED Final   Streptococcus pneumoniae NOT DETECTED NOT DETECTED Final   Streptococcus  pyogenes NOT DETECTED NOT DETECTED Final   A.calcoaceticus-baumannii NOT DETECTED NOT DETECTED Final   Bacteroides fragilis NOT DETECTED NOT DETECTED Final   Enterobacterales NOT DETECTED NOT DETECTED Final   Enterobacter cloacae complex NOT DETECTED NOT DETECTED Final   Escherichia coli NOT DETECTED NOT DETECTED Final   Klebsiella aerogenes NOT DETECTED NOT DETECTED Final   Klebsiella oxytoca NOT DETECTED NOT DETECTED Final   Klebsiella pneumoniae NOT DETECTED NOT DETECTED Final   Proteus species NOT DETECTED NOT DETECTED Final   Salmonella species NOT DETECTED NOT DETECTED Final   Serratia marcescens NOT DETECTED NOT DETECTED Final   Haemophilus influenzae NOT DETECTED NOT DETECTED Final   Neisseria meningitidis NOT DETECTED NOT DETECTED Final   Pseudomonas aeruginosa NOT DETECTED NOT DETECTED Final   Stenotrophomonas maltophilia NOT DETECTED NOT DETECTED Final   Candida albicans NOT DETECTED NOT DETECTED Final   Candida auris NOT DETECTED NOT DETECTED Final   Candida glabrata NOT DETECTED NOT DETECTED Final   Candida krusei NOT DETECTED NOT DETECTED Final   Candida parapsilosis NOT DETECTED NOT DETECTED Final   Candida tropicalis NOT DETECTED NOT DETECTED Final   Cryptococcus neoformans/gattii NOT DETECTED NOT DETECTED Final   Methicillin resistance mecA/C DETECTED (A) NOT DETECTED Final    Comment: CRITICAL RESULT CALLED TO, READ BACK BY AND VERIFIED WITH: RN Gwenith Spitz [id number]@ 2245 FH Performed at [facility], [address], [place], [place] [address]          Radiology Studies: CT CHEST ABDOMEN PELVIS W CONTRAST  Result Date: 01/03/2023 CLINICAL DATA:  Sepsis EXAM: CT CHEST, ABDOMEN, AND  PELVIS WITH CONTRAST TECHNIQUE: Multidetector CT imaging of the chest, abdomen and pelvis was performed following the standard protocol during bolus administration of intravenous contrast. RADIATION DOSE REDUCTION: This exam was performed according to the departmental dose-optimization program which includes automated exposure control, adjustment of the mA and/or kV according to patient size and/or use of iterative reconstruction technique. CONTRAST:  75mL OMNIPAQUE IOHEXOL 350 MG/ML SOLN COMPARISON:  04/15/2022 FINDINGS: CT CHEST FINDINGS Cardiovascular: Extensive multi-vessel coronary artery calcification. Global cardiac size within normal limits. No pericardial effusion. Central pulmonary arteries are of normal caliber. Mild dilation of the proximal descending thoracic aorta which measures 3.1 cm in diameter, stable since prior examination. Ascending aorta and distal descending aorta are of normal caliber. Moderate atherosclerotic calcification of the thoracic aorta. Mediastinum/Nodes: Stable subcentimeter hypoenhancing nodules within the right thyroid lobe which are unlikely of clinical significance and for which no follow-up imaging is recommended. No pathologic thoracic adenopathy. Esophagus unremarkable. Lungs/Pleura: Elevation of the left hemidiaphragm with associated left basilar atelectasis, unchanged. Lungs are otherwise clear. No pneumothorax or pleural effusion. Musculoskeletal: No chest wall mass or suspicious bone lesions identified. CT ABDOMEN PELVIS FINDINGS Hepatobiliary: Mild hepatic steatosis. No enhancing intrahepatic mass. No intra or extrahepatic biliary ductal dilation. Gallbladder unremarkable. Pancreas: Unremarkable. No pancreatic ductal dilatation or surrounding inflammatory changes. Spleen: Normal in size without focal abnormality. Adrenals/Urinary Tract: Adrenal glands are unremarkable. Kidneys are normal, without renal calculi, focal lesion, or hydronephrosis. Bladder is unremarkable.  Stomach/Bowel: Stomach is within normal limits. Appendix absent. No evidence of bowel wall thickening, distention, or inflammatory changes. Vascular/Lymphatic: Aortic atherosclerosis. No enlarged abdominal or pelvic lymph nodes. Reproductive: Status post hysterectomy. No adnexal masses. Other: No abdominal wall hernia or abnormality. No abdominopelvic ascites. Musculoskeletal: Osseous structures are age-appropriate. No acute bone abnormality. No lytic or blastic bone lesion. IMPRESSION: 1. No acute intrathoracic or intra-abdominal pathology identified. No definite radiographic explanation  for the patient's reported symptoms. 2. Extensive multi-vessel coronary artery calcification. 3. Mild hepatic steatosis. 4. Aortic atherosclerosis. Aortic Atherosclerosis (ICD10-I70.0). Electronically Signed   By: Helyn Numbers M.D.   On: 01/03/2023 01:00   CT Head Wo Contrast  Result Date: 01/02/2023 CLINICAL DATA:  Mental status change, unknown cause.  Sepsis. EXAM: CT HEAD WITHOUT CONTRAST TECHNIQUE: Contiguous axial images were obtained from the base of the skull through the vertex without intravenous contrast. RADIATION DOSE REDUCTION: This exam was performed according to the departmental dose-optimization program which includes automated exposure control, adjustment of the mA and/or kV according to patient size and/or use of iterative reconstruction technique. COMPARISON:  04/15/2022 FINDINGS: Brain: There is atrophy and chronic small vessel disease changes. No acute intracranial abnormality. Specifically, no hemorrhage, hydrocephalus, mass lesion, acute infarction, or significant intracranial injury. Vascular: No hyperdense vessel or unexpected calcification. Skull: No acute calvarial abnormality. Sinuses/Orbits: No acute findings Other: None IMPRESSION: Atrophy, chronic microvascular disease. No acute intracranial abnormality. Electronically Signed   By: Charlett Nose M.D.   On: 01/02/2023 23:42   DG Chest Portable 1  View  Result Date: 01/02/2023 CLINICAL DATA:  Possible sepsis.  Wet cough. EXAM: PORTABLE CHEST 1 VIEW COMPARISON:  Radiograph and CT 04/15/2022 FINDINGS: Stable cardiomediastinal silhouette. Similar elevation of the left hemidiaphragm and left basilar atelectasis. The lungs are otherwise clear. No displaced rib fractures. IMPRESSION: No active disease. Electronically Signed   By: Minerva Fester M.D.   On: 01/02/2023 23:07     Scheduled Meds:  atorvastatin  5 mg Oral QPM   enoxaparin (LOVENOX) injection  60 mg Subcutaneous Q12H   ferrous sulfate  325 mg Oral Q supper   insulin aspart  0-9 Units Subcutaneous Q4H   latanoprost  1 drop Both Eyes QHS   metoprolol tartrate  25 mg Oral BID   polyvinyl alcohol  1 drop Both Eyes BID   Continuous Infusions:  remdesivir 100 mg in sodium chloride 0.9 % 100 mL IVPB 100 mg (01/04/23 1030)     LOS: 1 day  Time spent: 39 min  Alwyn Ren, MD 01/04/2023, 12:53 PM

## 2023-01-04 NOTE — TOC Initial Note (Signed)
Transition of Care [facility]) - Initial/Assessment Note    Patient Details  Name: [name] MRN: [id number] Date of Birth: Aug 02, 1955  [facility]) CM/SW Contact:    Larrie Kass, LCSW Phone Number: 01/04/2023, 2:47 PM  Clinical Narrative:                 Pt is an LTC resident at [facility], where pt can return at the time of d/c. TOC to follow.   Expected Discharge Plan: Long Term Nursing Home Barriers to Discharge: Continued Medical Work up   Patient Goals and CMS Choice Patient states their goals for this hospitalization and ongoing recovery are:: retrun to LTC placement          Expected Discharge Plan and Services       Living arrangements for the past 2 months: Skilled Nursing Facility                                      Prior Living Arrangements/Services Living arrangements for the past 2 months: Skilled Nursing Facility Lives with:: Self                   Activities of Daily Living Home Assistive Devices/Equipment: None ADL Screening (condition at time of admission) Patient's cognitive ability adequate to safely complete daily activities?: No Is the patient deaf or have difficulty hearing?: No Does the patient have difficulty seeing, even when wearing glasses/contacts?: No Does the patient have difficulty concentrating, remembering, or making decisions?: Yes Patient able to express need for assistance with ADLs?: No Does the patient have difficulty dressing or bathing?: Yes Independently performs ADLs?: No Communication: Dependent Is this a change from baseline?: Pre-admission baseline Dressing (OT): Dependent Is this a change from baseline?: Pre-admission baseline Grooming: Dependent Is this a change from baseline?: Pre-admission baseline Feeding: Dependent Is this a change from baseline?: Pre-admission baseline Bathing: Dependent Is this a change from baseline?: Pre-admission baseline Toileting: Dependent Is  this a change from baseline?: Pre-admission baseline In/Out Bed: Dependent Is this a change from baseline?: Pre-admission baseline Walks in Home: Dependent Is this a change from baseline?: Pre-admission baseline Does the patient have difficulty walking or climbing stairs?: Yes Weakness of Legs: Both Weakness of Arms/Hands: Both  Permission Sought/Granted                  Emotional Assessment              Admission diagnosis:  SIRS (systemic inflammatory response syndrome) (HCC) [R65.10] COVID [U07.1] COVID-19 virus infection [U07.1] Severe sepsis (HCC) [A41.9, R65.20] Patient Active Problem List   Diagnosis Date Noted   COVID-19 virus infection 01/03/2023   Elevated troponin 01/03/2023   HLD (hyperlipidemia) 01/03/2023   Leukocytosis 11/17/2017   Severe sepsis (HCC) 11/17/2017   DVT (deep venous thrombosis) Rt Leg 11/14/2017   Pulmonary embolism (HCC) 11/13/2017   Pulmonary emboli (HCC) 11/13/2017   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   Cerebrovascular disease 01/17/2015   Acute metabolic encephalopathy 12/29/2011   Pyelonephritis, acute 12/29/2011   Hypokalemia 12/29/2011   Depression    MS (multiple sclerosis) (HCC) 12/28/2011   Fever 12/28/2011   DM2 (diabetes mellitus, type 2) (HCC) 12/28/2011   PCP:  Florentina Jenny, MD Pharmacy:   [facility] [place], [place] - [address] [place] [address] Phone: [phone] Fax: [phone]  Social Determinants of Health (SDOH) Social History: SDOH Screenings   Food Insecurity: Patient Unable To Answer (01/03/2023)  Housing: Low Risk  (01/04/2023)  Recent Concern: Housing - High Risk (01/03/2023)  Transportation Needs: Unknown (01/03/2023)  Tobacco Use: Low Risk  (01/02/2023)   SDOH Interventions: Housing Interventions: Intervention Not Indicated, Inpatient TOC (pt is a LTC resdent at [facility])   Readmission Risk Interventions     No data to display

## 2023-01-05 DIAGNOSIS — R6510 Systemic inflammatory response syndrome (SIRS) of non-infectious origin without acute organ dysfunction: Secondary | ICD-10-CM | POA: Diagnosis not present

## 2023-01-05 DIAGNOSIS — U071 COVID-19: Secondary | ICD-10-CM | POA: Diagnosis not present

## 2023-01-05 DIAGNOSIS — A4189 Other specified sepsis: Principal | ICD-10-CM

## 2023-01-05 LAB — CBC
HCT: 33.3 % — ABNORMAL LOW (ref 36.0–46.0)
Hemoglobin: 10.9 g/dL — ABNORMAL LOW (ref 12.0–15.0)
MCH: 26.6 pg (ref 26.0–34.0)
MCHC: 32.7 g/dL (ref 30.0–36.0)
MCV: 81.2 fL (ref 80.0–100.0)
Platelets: 167 10*3/uL (ref 150–400)
RBC: 4.1 MIL/uL (ref 3.87–5.11)
RDW: 14.2 % (ref 11.5–15.5)
WBC: 7 10*3/uL (ref 4.0–10.5)
nRBC: 0 % (ref 0.0–0.2)

## 2023-01-05 LAB — COMPREHENSIVE METABOLIC PANEL
ALT: 18 U/L (ref 0–44)
AST: 16 U/L (ref 15–41)
Albumin: 2.9 g/dL — ABNORMAL LOW (ref 3.5–5.0)
Alkaline Phosphatase: 41 U/L (ref 38–126)
Anion gap: 9 (ref 5–15)
BUN: 13 mg/dL (ref 8–23)
CO2: 20 mmol/L — ABNORMAL LOW (ref 22–32)
Calcium: 7.9 mg/dL — ABNORMAL LOW (ref 8.9–10.3)
Chloride: 108 mmol/L (ref 98–111)
Creatinine, Ser: 0.53 mg/dL (ref 0.44–1.00)
GFR, Estimated: >60 mL/min (ref >60)
Glucose, Bld: 147 mg/dL — ABNORMAL HIGH (ref 70–99)
Potassium: 3.1 mmol/L — ABNORMAL LOW (ref 3.5–5.1)
Sodium: 137 mmol/L (ref 135–145)
Total Bilirubin: 0.4 mg/dL (ref 0.3–1.2)
Total Protein: 6 g/dL — ABNORMAL LOW (ref 6.5–8.1)

## 2023-01-05 LAB — GLUCOSE, CAPILLARY
Glucose-Capillary: 127 mg/dL — ABNORMAL HIGH (ref 70–99)
Glucose-Capillary: 112 mg/dL — ABNORMAL HIGH (ref 70–99)
Glucose-Capillary: 118 mg/dL — ABNORMAL HIGH (ref 70–99)
Glucose-Capillary: 158 mg/dL — ABNORMAL HIGH (ref 70–99)
Glucose-Capillary: 184 mg/dL — ABNORMAL HIGH (ref 70–99)

## 2023-01-05 LAB — URINE CULTURE: Culture: >=100000 — AB

## 2023-01-05 LAB — CULTURE, BLOOD (ROUTINE X 2)

## 2023-01-05 LAB — C-REACTIVE PROTEIN: CRP: 7.9 mg/dL — ABNORMAL HIGH (ref <1.0)

## 2023-01-05 NOTE — Progress Notes (Signed)
[facility] PROGRESS NOTE    Progress Note  DEAUNDRA SCHRAG  WUX:[id number] DOB: 1956-01-26 DOA: 01/02/2023 PCP: Florentina Jenny, MD     Brief Narrative:   [name] is an 67 y.o. female past medical history of severe dementia advanced MS, PE/DVT on Xarelto, diabetes mellitus type 2, essential hypertension brought in to the ED was found to be septic in the setting of COVID-19 infection not hypoxic not hypotensive.  Urine and blood cultures were obtained start empirically on IV vancomycin and cefepime.  CT of the head chest abdomen and pelvis without any acute findings   Assessment/Plan:   Sepsis secondarily to COVID-19 virus infection No indication for steroids as she was not hypoxic on admission. Started on IV remdesivir, Tmax of 102.5. Continue Tylenol. Vitals are stable.  Acute metabolic encephalopathy encephalopathy superimposed on advanced dementia: No acute findings continue conservative management.  Diabetes mellitus type 2: With an A1c of 6.1, continue sliding scale insulin. Her eating is reduced.  History of MS (multiple sclerosis) (HCC) Follow-up with PCP as an outpatient.  History of DVT and PE: Continue Xarelto.  History of dysphagia: Continue dysphagia 1 diet.  Essential hypertension: Continue metoprolol hold Catapres. Blood pressures relatively well-controlled.  Hyperlipidemia: Continue statins.   DVT prophylaxis: Xarelto Family Communication:none Status is: Inpatient Remains inpatient appropriate because: Home in 24 hours.    Code Status:     Code Status Orders  (From admission, onward)           Start     Ordered   01/03/23 0352  Full code  Continuous       Question:  By:  Answer:  Consent: discussion documented in EHR   01/03/23 0354           Code Status History     Date Active Date Inactive Code Status Order ID Comments User Context   11/17/2017 1750 11/25/2017 1916 DNR [id number]  Jonah Blue, MD ED   11/17/2017  1624 11/17/2017 1750 Full Code [id number]  Marcos Eke, PA-C ED   11/13/2017 0216 11/16/2017 0102 Full Code [id number]  Rometta Emery, MD ED   12/28/2011 2228 12/30/2011 1759 Full Code [id number]  Rhae Lerner, RN Inpatient      Advance Directive Documentation    Flowsheet Row Most Recent Value  Type of Advance Directive Out of facility DNR (pink MOST or yellow form)  Pre-existing out of facility DNR order (yellow form or pink MOST form) --  "MOST" Form in Place? --         IV Access:   Peripheral IV   Procedures and diagnostic studies:   No results found.   Medical Consultants:   None.   Subjective:    SAHAANA TRITTEN no complaints  Objective:    Vitals:   01/04/23 2246 01/04/23 2334 01/05/23 0338 01/05/23 0754  BP: 130/88 121/81 137/83 (!) 134/96  Pulse:  (!) 110 92 97  Resp:  18 16   Temp: 99.8 F (37.7 C) 99.3 F (37.4 C) 98.6 F (37 C) 99.1 F (37.3 C)  TempSrc: Oral Oral Oral Oral  SpO2:  97% 100% 95%  Weight:      Height:       SpO2: 95 %   Intake/Output Summary (Last 24 hours) at 01/05/2023 0951 Last data filed at 01/05/2023 0630 Gross per 24 hour  Intake 1689.96 ml  Output 650 ml  Net 1039.96 ml   Filed Weights   01/02/23 2243 01/03/23 1456  Weight: 80 kg 61.9 kg    Exam: General exam: In no acute distress. Respiratory system: Good air movement and clear to auscultation. Cardiovascular system: S1 & S2 heard, RRR. No JVD Gastrointestinal system: Abdomen is nondistended, soft and nontender.  Extremities: No pedal edema. Skin: No rashes, lesions or ulcers Psychiatry: No judgment or insight and medical condition.     Data Reviewed:    Labs: Basic Metabolic Panel: Recent Labs  Lab 01/02/23 2246 01/03/23 1148 01/04/23 0425 01/05/23 0428  NA 138  --  134* 137  K 3.8  --  3.5 3.1*  CL 102  --  104 108  CO2 21*  --  20* 20*  GLUCOSE 191*  --  139* 147*  BUN 12  --  17 13  CREATININE 0.70  --  0.72 0.53  CALCIUM 8.7*  --   8.3* 7.9*  MG  --  1.7 1.9  --    GFR Estimated Creatinine Clearance: 66.4 mL/min (by C-G formula based on SCr of 0.53 mg/dL). Liver Function Tests: Recent Labs  Lab 01/02/23 2246 01/04/23 0425 01/05/23 0428  AST 21 20 16   ALT 25 23 18   ALKPHOS 56 50 41  BILITOT 0.2* 0.6 0.4  PROT 6.9 6.9 6.0*  ALBUMIN 3.2* 3.4* 2.9*   Recent Labs  Lab 01/03/23 0209  LIPASE 49   Recent Labs  Lab 01/03/23 0416  AMMONIA 17   Coagulation profile No results for input(s): "INR", "PROTIME" in the last 168 hours. COVID-19 Labs  Recent Labs    01/05/23 0428  CRP 7.9*    Lab Results  Component Value Date   SARSCOV2NAA POSITIVE (A) 01/02/2023    CBC: Recent Labs  Lab 01/02/23 2246 01/04/23 0425 01/05/23 0428  WBC 7.2 5.3 7.0  NEUTROABS 5.3  --   --   HGB 11.8* 11.3* 10.9*  HCT 35.8* 35.4* 33.3*  MCV 82.5 80.6 81.2  PLT 193 184 167   Cardiac Enzymes: No results for input(s): "CKTOTAL", "CKMB", "CKMBINDEX", "TROPONINI" in the last 168 hours. BNP (last 3 results) No results for input(s): "PROBNP" in the last 8760 hours. CBG: Recent Labs  Lab 01/04/23 1719 01/04/23 1932 01/04/23 2336 01/05/23 0341 01/05/23 0750  GLUCAP 200* 171* 116* 127* 112*   D-Dimer: No results for input(s): "DDIMER" in the last 72 hours. Hgb A1c: No results for input(s): "HGBA1C" in the last 72 hours. Lipid Profile: No results for input(s): "CHOL", "HDL", "LDLCALC", "TRIG", "CHOLHDL", "LDLDIRECT" in the last 72 hours. Thyroid function studies: No results for input(s): "TSH", "T4TOTAL", "T3FREE", "THYROIDAB" in the last 72 hours.  Invalid input(s): "FREET3" Anemia work up: No results for input(s): "VITAMINB12", "FOLATE", "FERRITIN", "TIBC", "IRON", "RETICCTPCT" in the last 72 hours. Sepsis Labs: Recent Labs  Lab 01/02/23 2246 01/02/23 2254 01/03/23 0214 01/03/23 0416 01/03/23 1148 01/04/23 0425 01/05/23 0428  PROCALCITON  --   --   --   --   --  0.11  --   WBC 7.2  --   --   --   --   5.3 7.0  LATICACIDVEN  --  2.5* 2.3* 2.5* 1.3  --   --    Microbiology Recent Results (from the past 240 hour(s))  Culture, blood (Routine X 2) w Reflex to ID Panel     Status: None (Preliminary result)   Collection Time: 01/02/23 10:46 PM   Specimen: BLOOD LEFT HAND  Result Value Ref Range Status   Specimen Description BLOOD LEFT HAND  Final   Special  Requests   Final    BOTTLES DRAWN AEROBIC AND ANAEROBIC Blood Culture adequate volume   Culture   Final    NO GROWTH 2 DAYS Performed at [facility], [address] [place], [place] [address]    Report Status PENDING  Incomplete  SARS Coronavirus 2 by RT PCR (hospital order, performed in [facility] hospital lab) *cepheid single result test* Anterior Nasal Swab     Status: Abnormal   Collection Time: 01/02/23 10:48 PM   Specimen: Anterior Nasal Swab  Result Value Ref Range Status   SARS Coronavirus 2 by RT PCR POSITIVE (A) NEGATIVE Final    Comment: Performed at [facility], [address], [place], [place] [address]  Urine Culture     Status: Abnormal   Collection Time: 01/03/23  2:25 AM   Specimen: Urine, Clean Catch  Result Value Ref Range Status   Specimen Description URINE, CLEAN CATCH  Final   Special Requests   Final    NONE Performed at [facility], [address] [place], [place] [address]    Culture >=100,000 COLONIES/mL ENTEROCOCCUS FAECALIS (A)  Final   Report Status 01/05/2023 FINAL  Final   Organism ID, Bacteria ENTEROCOCCUS FAECALIS (A)  Final      Susceptibility   Enterococcus faecalis - MIC*    AMPICILLIN <=2 SENSITIVE Sensitive     NITROFURANTOIN <=16 SENSITIVE Sensitive     VANCOMYCIN 2 SENSITIVE Sensitive     * >=100,000 COLONIES/mL ENTEROCOCCUS FAECALIS  Culture, blood (Routine X 2) w Reflex to ID Panel     Status: Abnormal   Collection Time: 01/03/23  4:00 AM   Specimen: BLOOD  Result Value Ref Range Status   Specimen Description BLOOD RIGHT ANTECUBITAL  Final   Special Requests    Final    BOTTLES DRAWN AEROBIC AND ANAEROBIC Blood Culture results may not be optimal due to an excessive volume of blood received in culture bottles   Culture  Setup Time   Final    GRAM POSITIVE COCCI IN CLUSTERS IN BOTH AEROBIC AND ANAEROBIC BOTTLES CRITICAL RESULT CALLED TO, READ BACK BY AND VERIFIED WITH: RN Gwenith Spitz [phone] 2245 FH    Culture (A)  Final    STAPHYLOCOCCUS EPIDERMIDIS THE SIGNIFICANCE OF ISOLATING THIS ORGANISM FROM A SINGLE SET OF BLOOD CULTURES WHEN MULTIPLE SETS ARE DRAWN IS UNCERTAIN. PLEASE NOTIFY THE MICROBIOLOGY DEPARTMENT WITHIN ONE WEEK IF SPECIATION AND SENSITIVITIES ARE REQUIRED. Performed at [facility], [address] [place], [place] [address]    Report Status 01/05/2023 FINAL  Final  Blood Culture ID Panel (Reflexed)     Status: Abnormal   Collection Time: 01/03/23  4:00 AM  Result Value Ref Range Status   Enterococcus faecalis NOT DETECTED NOT DETECTED Final   Enterococcus Faecium NOT DETECTED NOT DETECTED Final   Listeria monocytogenes NOT DETECTED NOT DETECTED Final   Staphylococcus species DETECTED (A) NOT DETECTED Final    Comment: CRITICAL RESULT CALLED TO, READ BACK BY AND VERIFIED WITH: RN Gwenith Spitz [phone]@ 2245 FH    Staphylococcus aureus (BCID) NOT DETECTED NOT DETECTED Final   Staphylococcus epidermidis DETECTED (A) NOT DETECTED Final    Comment: Methicillin (oxacillin) resistant coagulase negative staphylococcus. Possible blood culture contaminant (unless isolated from more than one blood culture draw or clinical case suggests pathogenicity). No antibiotic treatment is indicated for blood  culture contaminants. CRITICAL RESULT CALLED TO, READ BACK BY AND VERIFIED WITH: RN Gwenith Spitz [phone]  2245 FH    Staphylococcus lugdunensis NOT DETECTED NOT DETECTED Final   Streptococcus species NOT DETECTED NOT DETECTED Final   Streptococcus agalactiae NOT DETECTED NOT DETECTED Final   Streptococcus pneumoniae NOT DETECTED NOT DETECTED  Final   Streptococcus pyogenes NOT DETECTED NOT DETECTED Final   A.calcoaceticus-baumannii NOT DETECTED NOT DETECTED Final   Bacteroides fragilis NOT DETECTED NOT DETECTED Final   Enterobacterales NOT DETECTED NOT DETECTED Final   Enterobacter cloacae complex NOT DETECTED NOT DETECTED Final   Escherichia coli NOT DETECTED NOT DETECTED Final   Klebsiella aerogenes NOT DETECTED NOT DETECTED Final   Klebsiella oxytoca NOT DETECTED NOT DETECTED Final   Klebsiella pneumoniae NOT DETECTED NOT DETECTED Final   Proteus species NOT DETECTED NOT DETECTED Final   Salmonella species NOT DETECTED NOT DETECTED Final   Serratia marcescens NOT DETECTED NOT DETECTED Final   Haemophilus influenzae NOT DETECTED NOT DETECTED Final   Neisseria meningitidis NOT DETECTED NOT DETECTED Final   Pseudomonas aeruginosa NOT DETECTED NOT DETECTED Final   Stenotrophomonas maltophilia NOT DETECTED NOT DETECTED Final   Candida albicans NOT DETECTED NOT DETECTED Final   Candida auris NOT DETECTED NOT DETECTED Final   Candida glabrata NOT DETECTED NOT DETECTED Final   Candida krusei NOT DETECTED NOT DETECTED Final   Candida parapsilosis NOT DETECTED NOT DETECTED Final   Candida tropicalis NOT DETECTED NOT DETECTED Final   Cryptococcus neoformans/gattii NOT DETECTED NOT DETECTED Final   Methicillin resistance mecA/C DETECTED (A) NOT DETECTED Final    Comment: CRITICAL RESULT CALLED TO, READ BACK BY AND VERIFIED WITH: RN Gwenith Spitz [phone]@ 2245 FH Performed at [facility], [address] [place], [place] [address]      Medications:    atorvastatin  5 mg Oral QPM   ferrous sulfate  325 mg Oral Q supper   gabapentin  400 mg Oral TID   insulin aspart  0-9 Units Subcutaneous Q4H   latanoprost  1 drop Both Eyes QHS   metoprolol tartrate  25 mg Oral BID   polyvinyl alcohol  1 drop Both Eyes BID   rivaroxaban  20 mg Oral Q supper   Continuous Infusions:  sodium chloride 100 mL/hr at 01/05/23 9562    remdesivir 100 mg in sodium chloride 0.9 % 100 mL IVPB 100 mg (01/04/23 1030)      LOS: 2 days   Marinda Elk  [facility]  01/05/2023, 9:51 AM

## 2023-01-05 NOTE — Progress Notes (Signed)
SLP Cancellation Note  Patient Details Name: [name] MRN: [id number] DOB: 01/27/1956   Cancelled treatment:       Reason Eval/Treat Not Completed: Fatigue/lethargy limiting ability to participate (pt not adequately alert for po intake, did not awaken to SLP gentle verbal/tactile stimulation, will continue efforts)  Rolena Infante, MS [facility] SLP Acute Rehab Services Office [phone]  Chales Abrahams 01/05/2023, 4:04 PM

## 2023-01-05 NOTE — Progress Notes (Signed)
Speech Language Pathology Treatment: Dysphagia  Patient Details Name: [name] MRN: [id number] DOB: Jun 01, 1955 Today's Date: 01/05/2023 Time: 8657-8469 SLP Time Calculation (min) (ACUTE ONLY): 10 min  Assessment / Plan / Recommendation Clinical Impression  Pt seen for skilled SLP to determine readiness for dietary advancement, po tolerance. She was sleepy upon entrance to room and did not awaken to verbal nor gentle tactile stimulation. Weak cough noted at baseline when pt asleep - and pt awoke as SLP ready to leave room. Soft chart did not include premorbid diet information from her SNF = and pt is edentulous. Pt denies intake being poor due to displeasure with food, only due to lack of appetiite. She did not attempt to feed herself, appears contracted. SLP provided patient with po including water, Ensure, applesauce, graham crackers moistened with Ensure and applesauce. Pt's swallow clinically appears significantly delayed and pt demonstrated overt cough post-swallow of Ensure via straw. No further coughing episodes noted with po trials. Her "Mastication" is prolonged and inefficient and she benefited from applesauce to aid oral transiting. Pt required moderate verbal cues to stay awake during session, closing her eyes frequently. Due to her lack of dentition, cognitive status, MS and lethargy - recommend to continue puree/thin diet at this time and allow floor stock soft foods as tolerated. Will follow up for dietary advancement readiness. Created swallow precaution sign and posted outside of the pt's room for staff education.    HPI HPI: Pt is a 67 yo female presenting from SNF 8/18 with sepsis secondary to COVID. PMH includes: dysphagia (most recent swallow eval from 2013 was functional but mech soft was recommended as she did not have her dentures), dementia, advanced MS with severe functional impairment (bedbound and dependent for all ADLs at baseline), DMII, HTN, HLD, DVT/PE, chronic anemia,  depression      SLP Plan  Continue with current plan of care      Recommendations for follow up therapy are one component of a multi-disciplinary discharge planning process, led by the attending physician.  Recommendations may be updated based on patient status, additional functional criteria and insurance authorization.    Recommendations  Diet recommendations: Dysphagia 1 (puree);Thin liquid Liquids provided via: Cup;Straw Medication Administration: Whole meds with puree Supervision: Full supervision/cueing for compensatory strategies Compensations: Slow rate;Small sips/bites;Other (Comment) (delayed swallow, assure pt swallows before giving more) Postural Changes and/or Swallow Maneuvers: Seated upright 90 degrees;Upright 30-60 min after meal                  Oral care BID     Dysphagia, unspecified (R13.10)     Continue with current plan of care   Claudia Infante, MS [facility] SLP Acute Rehab Services Office [phone]   [name]  01/05/2023, 4:48 PM

## 2023-01-05 NOTE — Plan of Care (Signed)
  Problem: Coping: Goal: Ability to adjust to condition or change in health will improve Outcome: Progressing   Problem: Fluid Volume: Goal: Ability to maintain a balanced intake and output will improve Outcome: Progressing   Problem: Metabolic: Goal: Ability to maintain appropriate glucose levels will improve Outcome: Progressing   Problem: Nutritional: Goal: Maintenance of adequate nutrition will improve Outcome: Progressing   Problem: Tissue Perfusion: Goal: Adequacy of tissue perfusion will improve Outcome: Progressing   Problem: Coping: Goal: Psychosocial and spiritual needs will be supported Outcome: Progressing   Problem: Respiratory: Goal: Will maintain a patent airway Outcome: Progressing Goal: Complications related to the disease process, condition or treatment will be avoided or minimized Outcome: Progressing   Problem: Clinical Measurements: Goal: Ability to maintain clinical measurements within normal limits will improve Outcome: Progressing Goal: Diagnostic test results will improve Outcome: Progressing Goal: Respiratory complications will improve Outcome: Progressing Goal: Cardiovascular complication will be avoided Outcome: Progressing   Problem: Coping: Goal: Level of anxiety will decrease Outcome: Progressing   Problem: Elimination: Goal: Will not experience complications related to urinary retention Outcome: Progressing   Problem: Pain Managment: Goal: General experience of comfort will improve Outcome: Progressing   Problem: Safety: Goal: Ability to remain free from injury will improve Outcome: Progressing

## 2023-01-05 NOTE — Plan of Care (Signed)
  Problem: Metabolic: Goal: Ability to maintain appropriate glucose levels will improve Outcome: Progressing   Problem: Nutritional: Goal: Maintenance of adequate nutrition will improve Outcome: Progressing   Problem: Tissue Perfusion: Goal: Adequacy of tissue perfusion will improve Outcome: Progressing   Problem: Education: Goal: Ability to describe self-care measures that may prevent or decrease complications (Diabetes Survival Skills Education) will improve Outcome: Not Progressing   Problem: Health Behavior/Discharge Planning: Goal: Ability to identify and utilize available resources and services will improve Outcome: Not Progressing

## 2023-01-05 NOTE — Plan of Care (Signed)
  Problem: Respiratory: Goal: Will maintain a patent airway Outcome: Progressing   Problem: Education: Goal: Knowledge of risk factors and measures for prevention of condition will improve Outcome: Not Progressing   Problem: Clinical Measurements: Goal: Ability to maintain clinical measurements within normal limits will improve Outcome: Not Progressing   Problem: Nutrition: Goal: Adequate nutrition will be maintained Outcome: Not Progressing

## 2023-01-06 DIAGNOSIS — R6510 Systemic inflammatory response syndrome (SIRS) of non-infectious origin without acute organ dysfunction: Secondary | ICD-10-CM | POA: Diagnosis not present

## 2023-01-06 DIAGNOSIS — U071 COVID-19: Secondary | ICD-10-CM | POA: Diagnosis not present

## 2023-01-06 LAB — COMPREHENSIVE METABOLIC PANEL
ALT: 17 U/L (ref 0–44)
AST: 16 U/L (ref 15–41)
Albumin: 2.8 g/dL — ABNORMAL LOW (ref 3.5–5.0)
Alkaline Phosphatase: 40 U/L (ref 38–126)
Anion gap: 6 (ref 5–15)
BUN: 11 mg/dL (ref 8–23)
CO2: 22 mmol/L (ref 22–32)
Calcium: 7.8 mg/dL — ABNORMAL LOW (ref 8.9–10.3)
Chloride: 110 mmol/L (ref 98–111)
Creatinine, Ser: 0.47 mg/dL (ref 0.44–1.00)
GFR, Estimated: >60 mL/min (ref >60)
Glucose, Bld: 110 mg/dL — ABNORMAL HIGH (ref 70–99)
Potassium: 3.4 mmol/L — ABNORMAL LOW (ref 3.5–5.1)
Sodium: 138 mmol/L (ref 135–145)
Total Bilirubin: 0.3 mg/dL (ref 0.3–1.2)
Total Protein: 5.9 g/dL — ABNORMAL LOW (ref 6.5–8.1)

## 2023-01-06 LAB — GLUCOSE, CAPILLARY
Glucose-Capillary: 103 mg/dL — ABNORMAL HIGH (ref 70–99)
Glucose-Capillary: 111 mg/dL — ABNORMAL HIGH (ref 70–99)
Glucose-Capillary: 132 mg/dL — ABNORMAL HIGH (ref 70–99)
Glucose-Capillary: 139 mg/dL — ABNORMAL HIGH (ref 70–99)

## 2023-01-06 LAB — CBC
HCT: 30.9 % — ABNORMAL LOW (ref 36.0–46.0)
Hemoglobin: 10 g/dL — ABNORMAL LOW (ref 12.0–15.0)
MCH: 26.3 pg (ref 26.0–34.0)
MCHC: 32.4 g/dL (ref 30.0–36.0)
MCV: 81.3 fL (ref 80.0–100.0)
Platelets: 170 10*3/uL (ref 150–400)
RBC: 3.8 MIL/uL — ABNORMAL LOW (ref 3.87–5.11)
RDW: 14.4 % (ref 11.5–15.5)
WBC: 5.3 10*3/uL (ref 4.0–10.5)
nRBC: 0 % (ref 0.0–0.2)

## 2023-01-06 LAB — C-REACTIVE PROTEIN: CRP: 7.8 mg/dL — ABNORMAL HIGH (ref <1.0)

## 2023-01-06 NOTE — Discharge Summary (Addendum)
Physician Discharge Summary  [name] VWU:[id number] DOB: 09/28/1955 DOA: 01/02/2023  PCP: Florentina Jenny, MD  Admit date: 01/02/2023 Discharge date: 01/06/2023  Admitted From: SNF Disposition:  SNF  Recommendations for Outpatient Follow-up:  Follow up with PCP in 1-2 weeks Please obtain BMP/CBC in one week   Home Health:no Equipment/Devices:None  Discharge Condition:Stable CODE STATUS:Full Diet recommendation: Heart Healthy   Brief/Interim Summary:  67 y.o. female past medical history of severe dementia advanced MS, PE/DVT on Xarelto, diabetes mellitus type 2, essential hypertension brought in to the ED was found to be septic in the setting of COVID-19 infection not hypoxic not hypotensive.  Urine and blood cultures were obtained start empirically on IV vancomycin and cefepime.  CT of the head chest abdomen and pelvis without any acute findings   Discharge Diagnoses:  Principal Problem:   COVID-19 virus infection Active Problems:   MS (multiple sclerosis) (HCC)   DM2 (diabetes mellitus, type 2) (HCC)   Acute metabolic encephalopathy   Severe sepsis (HCC)   Elevated troponin   HLD (hyperlipidemia)  Sepsis secondary to COVID-19 infection: She was not hypoxic she is not eating no indication for steroid she was started on IV remdesivir. She defervesced and has remained afebrile for over 48 hours. She completed her course of remdesivir in house.  Acute metabolic encephalopathy superimposed on advanced admission: No acute findings on CT. Resolved.  Diabetes mellitus type 2: No changes to her medication.  History of MS: Follow-up with PCP.  History of DVT/PE: Continue Xarelto.  History dysphagia: Continue dysphagia 1 diet.  Essential hypertension: No changes made to her medication.  Hyperlipidemia. Continue statins.  Left heel and coccygeal unstageable ulcers present on admission: Noted    Discharge Instructions  Discharge Instructions     Diet - low  sodium heart healthy   Complete by: As directed    Increase activity slowly   Complete by: As directed    No wound care   Complete by: As directed       Allergies as of 01/06/2023   No Known Allergies      Medication List     TAKE these medications    acetaminophen 325 MG tablet Commonly known as: TYLENOL Take 650 mg by mouth in the morning, at noon, and at bedtime. DO NOT EXCEED 3,000 MG IN HOURS   ammonium lactate 12 % lotion Commonly known as: LAC-HYDRIN Apply 1 application. topically every 30 (thirty) days.   ARTIFICIAL TEAR SOLUTION OP Place 1 drop into both eyes 2 (two) times daily.   atorvastatin 10 MG tablet Commonly known as: LIPITOR Take 5 mg by mouth every evening.   cloNIDine 0.1 MG tablet Commonly known as: CATAPRES Take 0.1 mg by mouth See admin instructions. Give 1 tablet by mouth as needed for sBP greater than 180 or dBP greater than 95   Cranberry 450 MG Tabs Take 450 mg by mouth 2 (two) times daily.   ferrous sulfate 325 (65 FE) MG tablet Take 1 tablet (325 mg total) by mouth daily with breakfast. What changed: when to take this   gabapentin 400 MG capsule Commonly known as: NEURONTIN Take 400 mg by mouth 3 (three) times daily.   latanoprost 0.005 % ophthalmic solution Commonly known as: XALATAN Place 1 drop into both eyes at bedtime.   metFORMIN 500 MG tablet Commonly known as: GLUCOPHAGE Take 500 mg by mouth daily with breakfast.   metoprolol tartrate 100 MG tablet Commonly known as: LOPRESSOR Take 100 mg by  mouth 2 (two) times daily.   polyethylene glycol 17 g packet Commonly known as: MIRALAX / GLYCOLAX Take 17 g by mouth daily as needed for mild constipation.   rivaroxaban 20 MG Tabs tablet Commonly known as: XARELTO Take 1 tablet (20 mg total) by mouth daily with supper.   sennosides-docusate sodium 8.6-50 MG tablet Commonly known as: SENOKOT-S Take 2 tablets by mouth at bedtime.   Systane Balance 0.6 % Soln Generic  drug: Propylene Glycol Place 1 drop into both eyes 2 (two) times daily.   Vitamin D3 10 MCG (400 UNIT) Caps Take 800 Units by mouth every evening.        No Known Allergies  Consultations: None   Procedures/Studies: CT CHEST ABDOMEN PELVIS W CONTRAST  Result Date: 01/03/2023 CLINICAL DATA:  Sepsis EXAM: CT CHEST, ABDOMEN, AND PELVIS WITH CONTRAST TECHNIQUE: Multidetector CT imaging of the chest, abdomen and pelvis was performed following the standard protocol during bolus administration of intravenous contrast. RADIATION DOSE REDUCTION: This exam was performed according to the departmental dose-optimization program which includes automated exposure control, adjustment of the mA and/or kV according to patient size and/or use of iterative reconstruction technique. CONTRAST:  75mL OMNIPAQUE IOHEXOL 350 MG/ML SOLN COMPARISON:  04/15/2022 FINDINGS: CT CHEST FINDINGS Cardiovascular: Extensive multi-vessel coronary artery calcification. Global cardiac size within normal limits. No pericardial effusion. Central pulmonary arteries are of normal caliber. Mild dilation of the proximal descending thoracic aorta which measures 3.1 cm in diameter, stable since prior examination. Ascending aorta and distal descending aorta are of normal caliber. Moderate atherosclerotic calcification of the thoracic aorta. Mediastinum/Nodes: Stable subcentimeter hypoenhancing nodules within the right thyroid lobe which are unlikely of clinical significance and for which no follow-up imaging is recommended. No pathologic thoracic adenopathy. Esophagus unremarkable. Lungs/Pleura: Elevation of the left hemidiaphragm with associated left basilar atelectasis, unchanged. Lungs are otherwise clear. No pneumothorax or pleural effusion. Musculoskeletal: No chest wall mass or suspicious bone lesions identified. CT ABDOMEN PELVIS FINDINGS Hepatobiliary: Mild hepatic steatosis. No enhancing intrahepatic mass. No intra or extrahepatic  biliary ductal dilation. Gallbladder unremarkable. Pancreas: Unremarkable. No pancreatic ductal dilatation or surrounding inflammatory changes. Spleen: Normal in size without focal abnormality. Adrenals/Urinary Tract: Adrenal glands are unremarkable. Kidneys are normal, without renal calculi, focal lesion, or hydronephrosis. Bladder is unremarkable. Stomach/Bowel: Stomach is within normal limits. Appendix absent. No evidence of bowel wall thickening, distention, or inflammatory changes. Vascular/Lymphatic: Aortic atherosclerosis. No enlarged abdominal or pelvic lymph nodes. Reproductive: Status post hysterectomy. No adnexal masses. Other: No abdominal wall hernia or abnormality. No abdominopelvic ascites. Musculoskeletal: Osseous structures are age-appropriate. No acute bone abnormality. No lytic or blastic bone lesion. IMPRESSION: 1. No acute intrathoracic or intra-abdominal pathology identified. No definite radiographic explanation for the patient's reported symptoms. 2. Extensive multi-vessel coronary artery calcification. 3. Mild hepatic steatosis. 4. Aortic atherosclerosis. Aortic Atherosclerosis (ICD10-I70.0). Electronically Signed   By: Helyn Numbers M.D.   On: 01/03/2023 01:00   CT Head Wo Contrast  Result Date: 01/02/2023 CLINICAL DATA:  Mental status change, unknown cause.  Sepsis. EXAM: CT HEAD WITHOUT CONTRAST TECHNIQUE: Contiguous axial images were obtained from the base of the skull through the vertex without intravenous contrast. RADIATION DOSE REDUCTION: This exam was performed according to the departmental dose-optimization program which includes automated exposure control, adjustment of the mA and/or kV according to patient size and/or use of iterative reconstruction technique. COMPARISON:  04/15/2022 FINDINGS: Brain: There is atrophy and chronic small vessel disease changes. No acute intracranial abnormality. Specifically, no hemorrhage, hydrocephalus, mass  lesion, acute infarction, or  significant intracranial injury. Vascular: No hyperdense vessel or unexpected calcification. Skull: No acute calvarial abnormality. Sinuses/Orbits: No acute findings Other: None IMPRESSION: Atrophy, chronic microvascular disease. No acute intracranial abnormality. Electronically Signed   By: Charlett Nose M.D.   On: 01/02/2023 23:42   DG Chest Portable 1 View  Result Date: 01/02/2023 CLINICAL DATA:  Possible sepsis.  Wet cough. EXAM: PORTABLE CHEST 1 VIEW COMPARISON:  Radiograph and CT 04/15/2022 FINDINGS: Stable cardiomediastinal silhouette. Similar elevation of the left hemidiaphragm and left basilar atelectasis. The lungs are otherwise clear. No displaced rib fractures. IMPRESSION: No active disease. Electronically Signed   By: Minerva Fester M.D.   On: 01/02/2023 23:07   (Echo, Carotid, EGD, Colonoscopy, ERCP)    Subjective: No complaints  Discharge Exam: Vitals:   01/05/23 2009 01/06/23 0421  BP: (!) 158/83 (!) 158/99  Pulse: (!) 106 86  Resp: 20 20  Temp: 99.1 F (37.3 C) 98.6 F (37 C)  SpO2: 97% 100%   Vitals:   01/05/23 1201 01/05/23 1845 01/05/23 2009 01/06/23 0421  BP: (!) 133/92  (!) 158/83 (!) 158/99  Pulse: 87  (!) 106 86  Resp: 19  20 20   Temp: 99.1 F (37.3 C) 98 F (36.7 C) 99.1 F (37.3 C) 98.6 F (37 C)  TempSrc: Oral Oral Oral Oral  SpO2: 94%  97% 100%  Weight:      Height:        General: Pt is alert, awake, not in acute distress Cardiovascular: RRR, S1/S2 +, no rubs, no gallops Respiratory: CTA bilaterally, no wheezing, no rhonchi Abdominal: Soft, NT, ND, bowel sounds + Extremities: no edema, no cyanosis    The results of significant diagnostics from this hospitalization (including imaging, microbiology, ancillary and laboratory) are listed below for reference.     Microbiology: Recent Results (from the past 240 hour(s))  Culture, blood (Routine X 2) w Reflex to ID Panel     Status: None (Preliminary result)   Collection Time: 01/02/23 10:46  PM   Specimen: BLOOD LEFT HAND  Result Value Ref Range Status   Specimen Description BLOOD LEFT HAND  Final   Special Requests   Final    BOTTLES DRAWN AEROBIC AND ANAEROBIC Blood Culture adequate volume   Culture   Final    NO GROWTH 3 DAYS Performed at [facility], [address], [place], [place] [address]    Report Status PENDING  Incomplete  SARS Coronavirus 2 by RT PCR (hospital order, performed in [facility] hospital lab) *cepheid single result test* Anterior Nasal Swab     Status: Abnormal   Collection Time: 01/02/23 10:48 PM   Specimen: Anterior Nasal Swab  Result Value Ref Range Status   SARS Coronavirus 2 by RT PCR POSITIVE (A) NEGATIVE Final    Comment: Performed at [facility], [address] [place], [place] [address]  Urine Culture     Status: Abnormal   Collection Time: 01/03/23  2:25 AM   Specimen: Urine, Clean Catch  Result Value Ref Range Status   Specimen Description URINE, CLEAN CATCH  Final   Special Requests   Final    NONE Performed at [facility], [address] [place], [place] [address]    Culture >=100,000 COLONIES/mL ENTEROCOCCUS FAECALIS (A)  Final   Report Status 01/05/2023 FINAL  Final   Organism ID, Bacteria ENTEROCOCCUS FAECALIS (A)  Final      Susceptibility   Enterococcus faecalis -  MIC*    AMPICILLIN <=2 SENSITIVE Sensitive     NITROFURANTOIN <=16 SENSITIVE Sensitive     VANCOMYCIN 2 SENSITIVE Sensitive     * >=100,000 COLONIES/mL ENTEROCOCCUS FAECALIS  Culture, blood (Routine X 2) w Reflex to ID Panel     Status: Abnormal   Collection Time: 01/03/23  4:00 AM   Specimen: BLOOD  Result Value Ref Range Status   Specimen Description BLOOD RIGHT ANTECUBITAL  Final   Special Requests   Final    BOTTLES DRAWN AEROBIC AND ANAEROBIC Blood Culture results may not be optimal due to an excessive volume of blood received in culture bottles   Culture  Setup Time   Final    GRAM POSITIVE COCCI IN CLUSTERS IN BOTH AEROBIC AND  ANAEROBIC BOTTLES CRITICAL RESULT CALLED TO, READ BACK BY AND VERIFIED WITH: RN Gwenith Spitz [phone] 2245 FH    Culture (A)  Final    STAPHYLOCOCCUS EPIDERMIDIS THE SIGNIFICANCE OF ISOLATING THIS ORGANISM FROM A SINGLE SET OF BLOOD CULTURES WHEN MULTIPLE SETS ARE DRAWN IS UNCERTAIN. PLEASE NOTIFY THE MICROBIOLOGY DEPARTMENT WITHIN ONE WEEK IF SPECIATION AND SENSITIVITIES ARE REQUIRED. Performed at [facility], [address], [place], [place] [address]    Report Status 01/05/2023 FINAL  Final  Blood Culture ID Panel (Reflexed)     Status: Abnormal   Collection Time: 01/03/23  4:00 AM  Result Value Ref Range Status   Enterococcus faecalis NOT DETECTED NOT DETECTED Final   Enterococcus Faecium NOT DETECTED NOT DETECTED Final   Listeria monocytogenes NOT DETECTED NOT DETECTED Final   Staphylococcus species DETECTED (A) NOT DETECTED Final    Comment: CRITICAL RESULT CALLED TO, READ BACK BY AND VERIFIED WITH: RN Gwenith Spitz [phone]@ 2245 FH    Staphylococcus aureus (BCID) NOT DETECTED NOT DETECTED Final   Staphylococcus epidermidis DETECTED (A) NOT DETECTED Final    Comment: Methicillin (oxacillin) resistant coagulase negative staphylococcus. Possible blood culture contaminant (unless isolated from more than one blood culture draw or clinical case suggests pathogenicity). No antibiotic treatment is indicated for blood  culture contaminants. CRITICAL RESULT CALLED TO, READ BACK BY AND VERIFIED WITH: RN Gwenith Spitz [phone]@ 2245 FH    Staphylococcus lugdunensis NOT DETECTED NOT DETECTED Final   Streptococcus species NOT DETECTED NOT DETECTED Final   Streptococcus agalactiae NOT DETECTED NOT DETECTED Final   Streptococcus pneumoniae NOT DETECTED NOT DETECTED Final   Streptococcus pyogenes NOT DETECTED NOT DETECTED Final   A.calcoaceticus-baumannii NOT DETECTED NOT DETECTED Final   Bacteroides fragilis NOT DETECTED NOT DETECTED Final   Enterobacterales NOT DETECTED NOT DETECTED Final    Enterobacter cloacae complex NOT DETECTED NOT DETECTED Final   Escherichia coli NOT DETECTED NOT DETECTED Final   Klebsiella aerogenes NOT DETECTED NOT DETECTED Final   Klebsiella oxytoca NOT DETECTED NOT DETECTED Final   Klebsiella pneumoniae NOT DETECTED NOT DETECTED Final   Proteus species NOT DETECTED NOT DETECTED Final   Salmonella species NOT DETECTED NOT DETECTED Final   Serratia marcescens NOT DETECTED NOT DETECTED Final   Haemophilus influenzae NOT DETECTED NOT DETECTED Final   Neisseria meningitidis NOT DETECTED NOT DETECTED Final   Pseudomonas aeruginosa NOT DETECTED NOT DETECTED Final   Stenotrophomonas maltophilia NOT DETECTED NOT DETECTED Final   Candida albicans NOT DETECTED NOT DETECTED Final   Candida auris NOT DETECTED NOT DETECTED Final   Candida glabrata NOT DETECTED NOT DETECTED Final   Candida krusei NOT DETECTED NOT DETECTED Final   Candida parapsilosis NOT DETECTED NOT DETECTED Final  Candida tropicalis NOT DETECTED NOT DETECTED Final   Cryptococcus neoformans/gattii NOT DETECTED NOT DETECTED Final   Methicillin resistance mecA/C DETECTED (A) NOT DETECTED Final    Comment: CRITICAL RESULT CALLED TO, READ BACK BY AND VERIFIED WITH: RN Gwenith Spitz [phone]@ 2245 FH Performed at [facility], [address], [place], [place] [address]      Labs: BNP (last 3 results) No results for input(s): "BNP" in the last 8760 hours. Basic Metabolic Panel: Recent Labs  Lab 01/02/23 2246 01/03/23 1148 01/04/23 0425 01/05/23 0428 01/06/23 0425  NA 138  --  134* 137 138  K 3.8  --  3.5 3.1* 3.4*  CL 102  --  104 108 110  CO2 21*  --  20* 20* 22  GLUCOSE 191*  --  139* 147* 110*  BUN 12  --  17 13 11   CREATININE 0.70  --  0.72 0.53 0.47  CALCIUM 8.7*  --  8.3* 7.9* 7.8*  MG  --  1.7 1.9  --   --    Liver Function Tests: Recent Labs  Lab 01/02/23 2246 01/04/23 0425 01/05/23 0428 01/06/23 0425  AST 21 20 16 16   ALT 25 23 18 17   ALKPHOS 56 50 41 40   BILITOT 0.2* 0.6 0.4 0.3  PROT 6.9 6.9 6.0* 5.9*  ALBUMIN 3.2* 3.4* 2.9* 2.8*   Recent Labs  Lab 01/03/23 0209  LIPASE 49   Recent Labs  Lab 01/03/23 0416  AMMONIA 17   CBC: Recent Labs  Lab 01/02/23 2246 01/04/23 0425 01/05/23 0428 01/06/23 0425  WBC 7.2 5.3 7.0 5.3  NEUTROABS 5.3  --   --   --   HGB 11.8* 11.3* 10.9* 10.0*  HCT 35.8* 35.4* 33.3* 30.9*  MCV 82.5 80.6 81.2 81.3  PLT 193 184 167 170   Cardiac Enzymes: No results for input(s): "CKTOTAL", "CKMB", "CKMBINDEX", "TROPONINI" in the last 168 hours. BNP: Invalid input(s): "POCBNP" CBG: Recent Labs  Lab 01/05/23 1712 01/05/23 2005 01/06/23 0009 01/06/23 0407 01/06/23 0745  GLUCAP 158* 184* 132* 103* 111*   D-Dimer No results for input(s): "DDIMER" in the last 72 hours. Hgb A1c No results for input(s): "HGBA1C" in the last 72 hours. Lipid Profile No results for input(s): "CHOL", "HDL", "LDLCALC", "TRIG", "CHOLHDL", "LDLDIRECT" in the last 72 hours. Thyroid function studies No results for input(s): "TSH", "T4TOTAL", "T3FREE", "THYROIDAB" in the last 72 hours.  Invalid input(s): "FREET3" Anemia work up No results for input(s): "VITAMINB12", "FOLATE", "FERRITIN", "TIBC", "IRON", "RETICCTPCT" in the last 72 hours. Urinalysis    Component Value Date/Time   COLORURINE COLORLESS (A) 01/03/2023 0225   APPEARANCEUR CLEAR 01/03/2023 0225   LABSPEC 1.015 01/03/2023 0225   PHURINE 7.0 01/03/2023 0225   GLUCOSEU NEGATIVE 01/03/2023 0225   HGBUR SMALL (A) 01/03/2023 0225   BILIRUBINUR NEGATIVE 01/03/2023 0225   KETONESUR NEGATIVE 01/03/2023 0225   PROTEINUR NEGATIVE 01/03/2023 0225   UROBILINOGEN 1.0 12/28/2011 1634   NITRITE NEGATIVE 01/03/2023 0225   LEUKOCYTESUR NEGATIVE 01/03/2023 0225   Sepsis Labs Recent Labs  Lab 01/02/23 2246 01/04/23 0425 01/05/23 0428 01/06/23 0425  WBC 7.2 5.3 7.0 5.3   Microbiology Recent Results (from the past 240 hour(s))  Culture, blood (Routine X 2) w Reflex  to ID Panel     Status: None (Preliminary result)   Collection Time: 01/02/23 10:46 PM   Specimen: BLOOD LEFT HAND  Result Value Ref Range Status   Specimen Description BLOOD LEFT HAND  Final  Special Requests   Final    BOTTLES DRAWN AEROBIC AND ANAEROBIC Blood Culture adequate volume   Culture   Final    NO GROWTH 3 DAYS Performed at [facility], [address], [place], [place] [address]    Report Status PENDING  Incomplete  SARS Coronavirus 2 by RT PCR (hospital order, performed in [facility] hospital lab) *cepheid single result test* Anterior Nasal Swab     Status: Abnormal   Collection Time: 01/02/23 10:48 PM   Specimen: Anterior Nasal Swab  Result Value Ref Range Status   SARS Coronavirus 2 by RT PCR POSITIVE (A) NEGATIVE Final    Comment: Performed at [facility], [address], [place], [place] [address]  Urine Culture     Status: Abnormal   Collection Time: 01/03/23  2:25 AM   Specimen: Urine, Clean Catch  Result Value Ref Range Status   Specimen Description URINE, CLEAN CATCH  Final   Special Requests   Final    NONE Performed at [facility], [address] [place], [place] [address]    Culture >=100,000 COLONIES/mL ENTEROCOCCUS FAECALIS (A)  Final   Report Status 01/05/2023 FINAL  Final   Organism ID, Bacteria ENTEROCOCCUS FAECALIS (A)  Final      Susceptibility   Enterococcus faecalis - MIC*    AMPICILLIN <=2 SENSITIVE Sensitive     NITROFURANTOIN <=16 SENSITIVE Sensitive     VANCOMYCIN 2 SENSITIVE Sensitive     * >=100,000 COLONIES/mL ENTEROCOCCUS FAECALIS  Culture, blood (Routine X 2) w Reflex to ID Panel     Status: Abnormal   Collection Time: 01/03/23  4:00 AM   Specimen: BLOOD  Result Value Ref Range Status   Specimen Description BLOOD RIGHT ANTECUBITAL  Final   Special Requests   Final    BOTTLES DRAWN AEROBIC AND ANAEROBIC Blood Culture results may not be optimal due to an excessive volume of blood received in culture bottles    Culture  Setup Time   Final    GRAM POSITIVE COCCI IN CLUSTERS IN BOTH AEROBIC AND ANAEROBIC BOTTLES CRITICAL RESULT CALLED TO, READ BACK BY AND VERIFIED WITH: RN Gwenith Spitz [phone] 2245 FH    Culture (A)  Final    STAPHYLOCOCCUS EPIDERMIDIS THE SIGNIFICANCE OF ISOLATING THIS ORGANISM FROM A SINGLE SET OF BLOOD CULTURES WHEN MULTIPLE SETS ARE DRAWN IS UNCERTAIN. PLEASE NOTIFY THE MICROBIOLOGY DEPARTMENT WITHIN ONE WEEK IF SPECIATION AND SENSITIVITIES ARE REQUIRED. Performed at [facility], [address], [place], [place] [address]    Report Status 01/05/2023 FINAL  Final  Blood Culture ID Panel (Reflexed)     Status: Abnormal   Collection Time: 01/03/23  4:00 AM  Result Value Ref Range Status   Enterococcus faecalis NOT DETECTED NOT DETECTED Final   Enterococcus Faecium NOT DETECTED NOT DETECTED Final   Listeria monocytogenes NOT DETECTED NOT DETECTED Final   Staphylococcus species DETECTED (A) NOT DETECTED Final    Comment: CRITICAL RESULT CALLED TO, READ BACK BY AND VERIFIED WITH: RN Gwenith Spitz [phone]@ 2245 FH    Staphylococcus aureus (BCID) NOT DETECTED NOT DETECTED Final   Staphylococcus epidermidis DETECTED (A) NOT DETECTED Final    Comment: Methicillin (oxacillin) resistant coagulase negative staphylococcus. Possible blood culture contaminant (unless isolated from more than one blood culture draw or clinical case suggests pathogenicity). No antibiotic treatment is indicated for blood  culture contaminants. CRITICAL RESULT CALLED TO, READ BACK BY AND VERIFIED WITH: RN E. SHAFFER  [phone]@ 2245 FH    Staphylococcus lugdunensis NOT DETECTED NOT DETECTED Final   Streptococcus species NOT DETECTED NOT DETECTED Final   Streptococcus agalactiae NOT DETECTED NOT DETECTED Final   Streptococcus pneumoniae NOT DETECTED NOT DETECTED Final   Streptococcus pyogenes NOT DETECTED NOT DETECTED Final   A.calcoaceticus-baumannii NOT DETECTED NOT DETECTED Final   Bacteroides fragilis NOT  DETECTED NOT DETECTED Final   Enterobacterales NOT DETECTED NOT DETECTED Final   Enterobacter cloacae complex NOT DETECTED NOT DETECTED Final   Escherichia coli NOT DETECTED NOT DETECTED Final   Klebsiella aerogenes NOT DETECTED NOT DETECTED Final   Klebsiella oxytoca NOT DETECTED NOT DETECTED Final   Klebsiella pneumoniae NOT DETECTED NOT DETECTED Final   Proteus species NOT DETECTED NOT DETECTED Final   Salmonella species NOT DETECTED NOT DETECTED Final   Serratia marcescens NOT DETECTED NOT DETECTED Final   Haemophilus influenzae NOT DETECTED NOT DETECTED Final   Neisseria meningitidis NOT DETECTED NOT DETECTED Final   Pseudomonas aeruginosa NOT DETECTED NOT DETECTED Final   Stenotrophomonas maltophilia NOT DETECTED NOT DETECTED Final   Candida albicans NOT DETECTED NOT DETECTED Final   Candida auris NOT DETECTED NOT DETECTED Final   Candida glabrata NOT DETECTED NOT DETECTED Final   Candida krusei NOT DETECTED NOT DETECTED Final   Candida parapsilosis NOT DETECTED NOT DETECTED Final   Candida tropicalis NOT DETECTED NOT DETECTED Final   Cryptococcus neoformans/gattii NOT DETECTED NOT DETECTED Final   Methicillin resistance mecA/C DETECTED (A) NOT DETECTED Final    Comment: CRITICAL RESULT CALLED TO, READ BACK BY AND VERIFIED WITH: RN Gwenith Spitz [phone]@ 2245 FH Performed at [facility], [address], [place], [place] [address]      Time coordinating discharge: Over 30 minutes  SIGNED:   Marinda Elk, MD  [facility] 01/06/2023, 9:45 AM Pager   If 7PM-7AM, please contact night-coverage [email or web address] Password [email or web address]

## 2023-01-06 NOTE — TOC Transition Note (Signed)
Transition of Care [facility]) - CM/SW Discharge Note   Patient Details  Name: [name] MRN: [id number] Date of Birth: 07-06-1955  Transition of Care [facility]) CM/SW Contact:  Larrie Kass, LCSW Phone Number: 01/06/2023, 9:54 AM   Clinical Narrative:    Pt to d/c back to [facility], RM:[id number] call report [phone]. CSW attempted to call pt's sister to inform of transfer, no answer left HIPAA compliant VM. PTAR called TOC sign off.    Final next level of care: Long Term Nursing Home Barriers to Discharge: No Barriers Identified   Patient Goals and CMS Choice      Discharge Placement                  Patient to be transferred to facility by: ems Name of family member notified: [name] (Sister Patient and family notified of of transfer: 01/06/23  Discharge Plan and Services Additional resources added to the After Visit Summary for                                       Social Determinants of Health (SDOH) Interventions SDOH Screenings   Food Insecurity: Patient Unable To Answer (01/03/2023)  Housing: Low Risk  (01/04/2023)  Recent Concern: Housing - High Risk (01/03/2023)  Transportation Needs: Unknown (01/03/2023)  Utilities: Not At Risk (01/04/2023)  Tobacco Use: Low Risk  (01/02/2023)     Readmission Risk Interventions     No data to display

## 2023-01-08 LAB — CULTURE, BLOOD (ROUTINE X 2)
Culture: NO GROWTH
Special Requests: ADEQUATE

## 2023-02-24 IMAGING — CT CT MAXILLOFACIAL W/O CM
3 of 4 series · 14 of 47 positions shown, 16 images · non-contrast
Comparison: CT head September 11, 21.

CLINICAL DATA: Head trauma, minor (Age >= 65y); Facial trauma,
blunt

EXAM:
CT HEAD WITHOUT CONTRAST
CT MAXILLOFACIAL WITHOUT CONTRAST
TECHNIQUE: Multidetector CT imaging of the head and maxillofacial structures
were performed using the standard protocol without intravenous
contrast. Multiplanar CT image reconstructions of the maxillofacial
structures were also generated.
RADIATION DOSE REDUCTION: This exam was performed according to the
departmental dose-optimization program which includes automated
exposure control, adjustment of the mA and/or kV according to
patient size and/or use of iterative reconstruction technique.

[Series 9: facialbone 2.0 cor st · coronal · 0.41mm/px · 3 of 93 slices shown]
[im 31/93  bone]
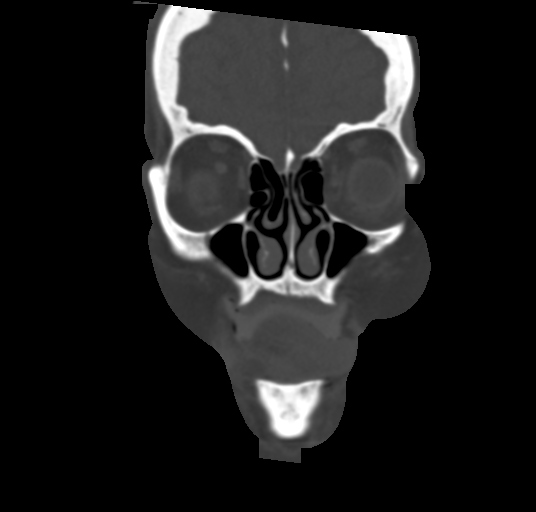
[im 41/93  bone]
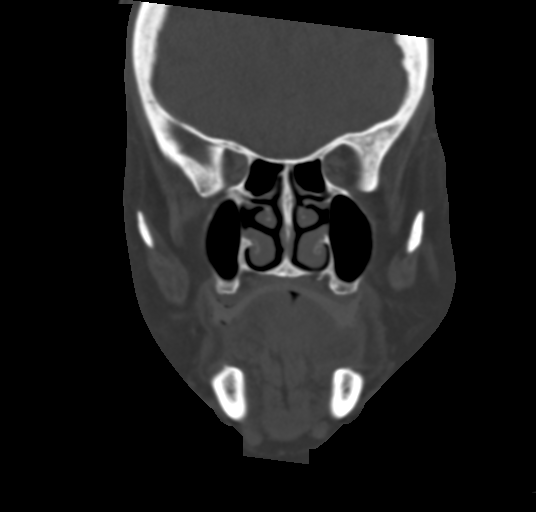
[im 52/93  bone]
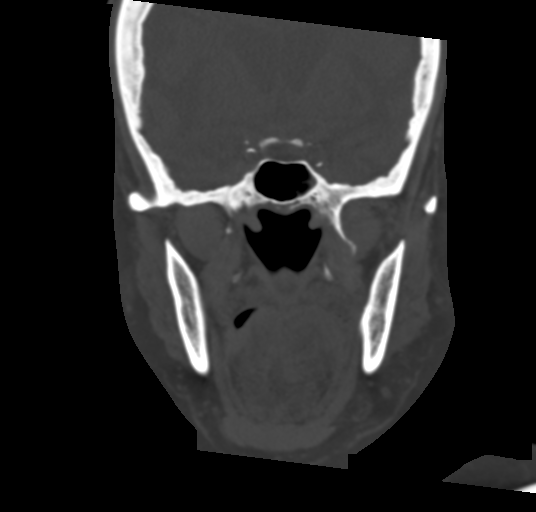

[Series 10: facialbone 2.0 sag st · sagittal · 0.38mm/px · 3 of 76 slices shown]
[im 29/76  bone]
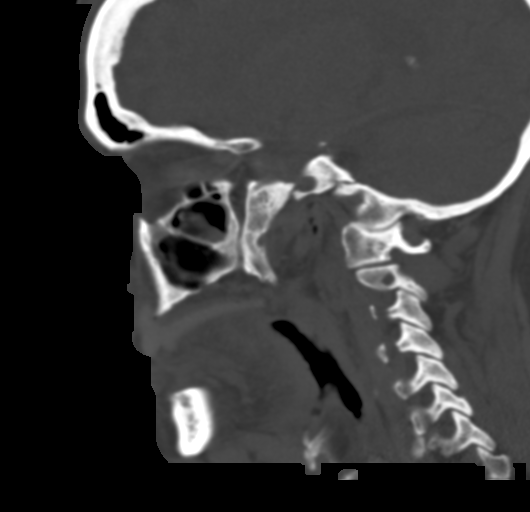
[im 38/76  bone]
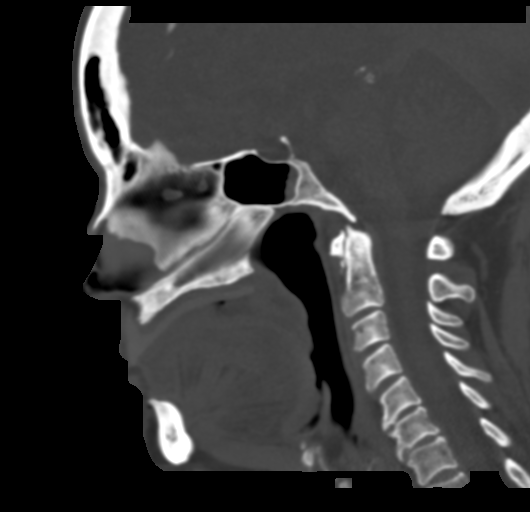
[im 48/76  bone]
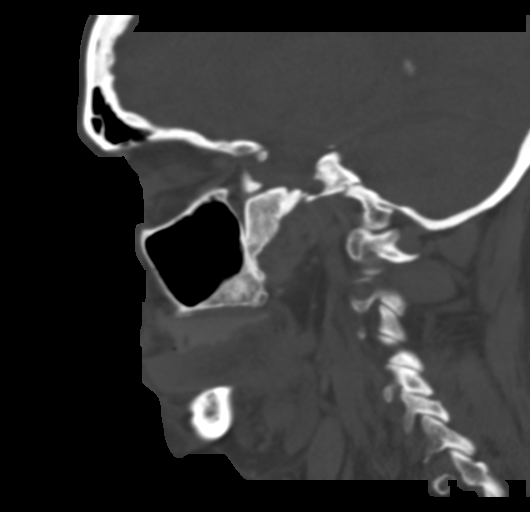

[Series 11: facialbone 2.0 ax st · axial · 0.35mm/px · z∈[-213,-35]mm · 8 of 104 slices shown, 10 images]
[im 7/104  brain]
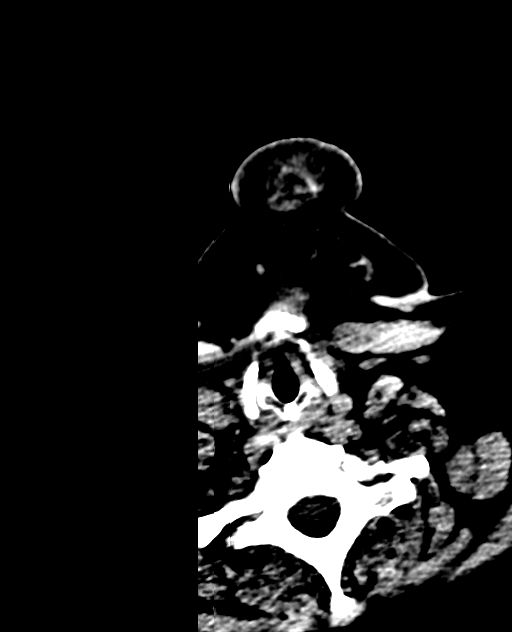
[im 7/104  bone]
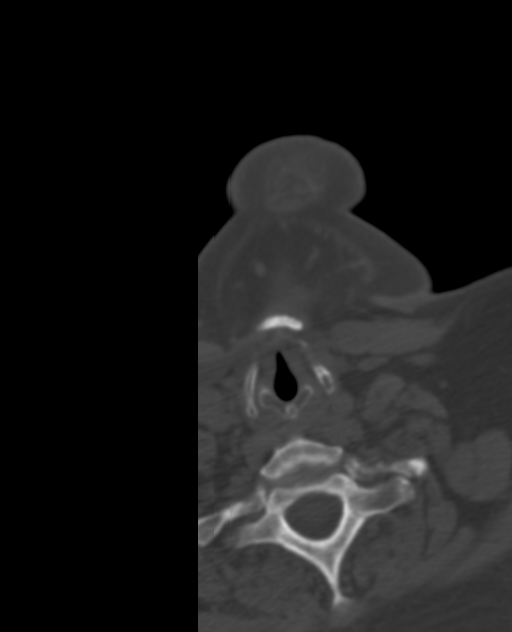
[im 21/104  bone]
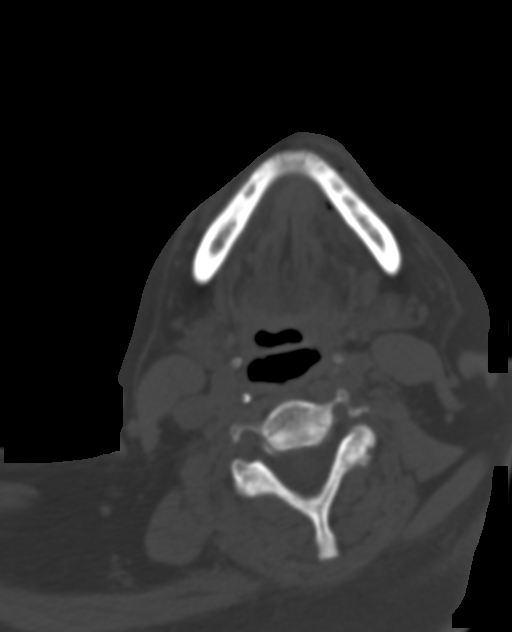
[im 35/104  bone]
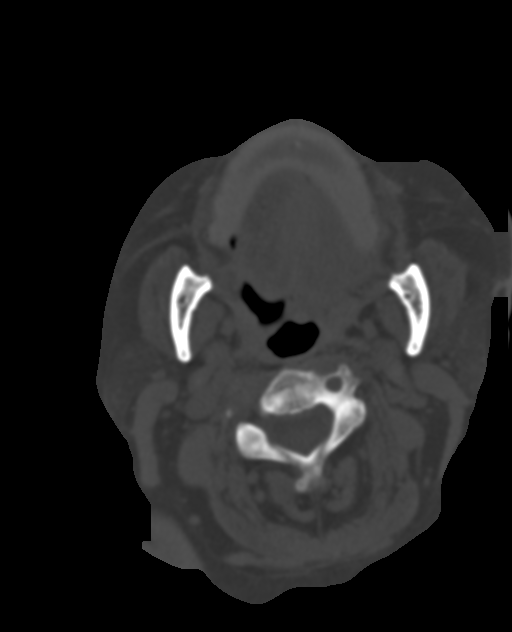
[im 49/104  bone]
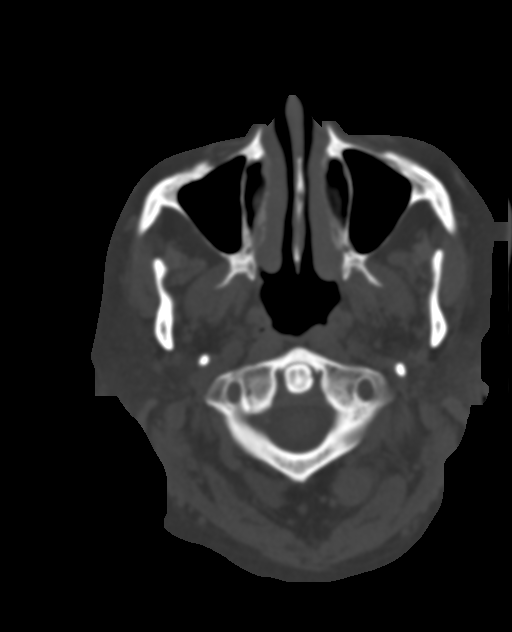
[im 55/104  brain]
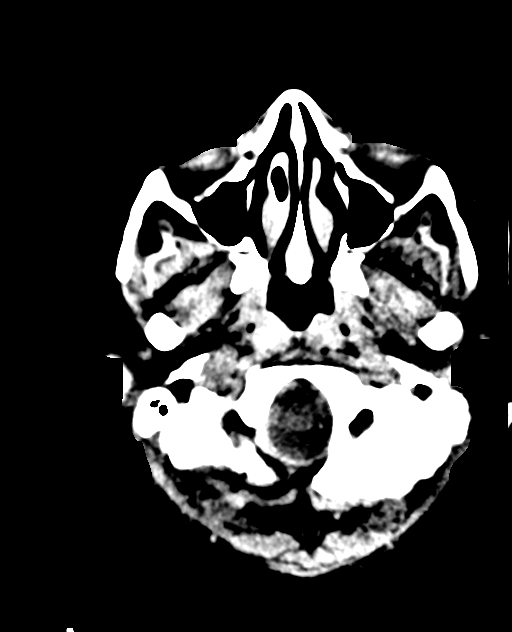
[im 55/104  bone]
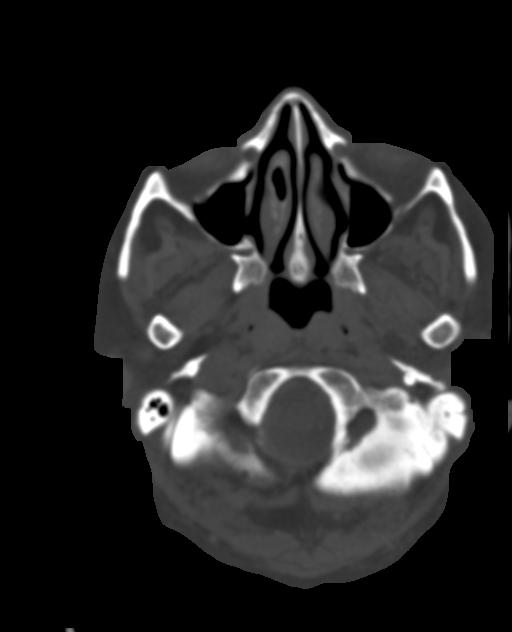
[im 69/104  bone]
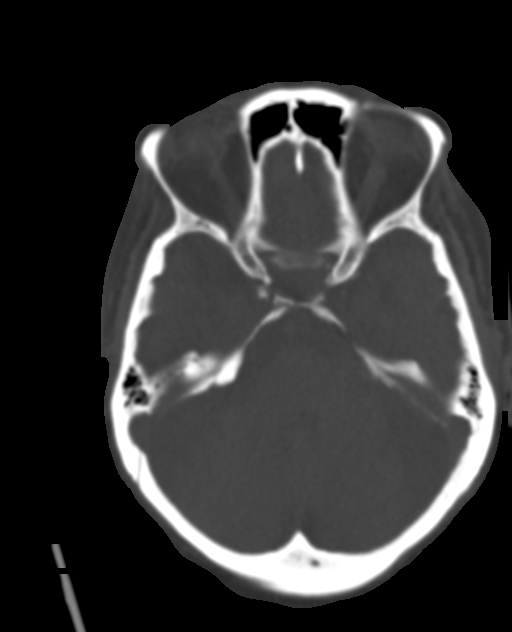
[im 83/104  bone]
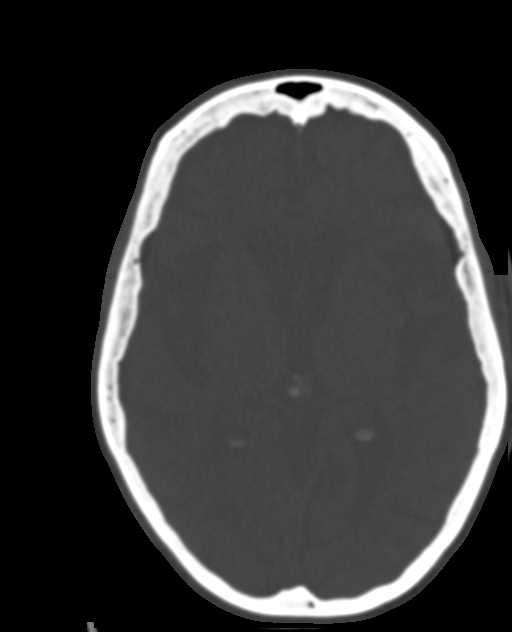
[im 97/104  bone]
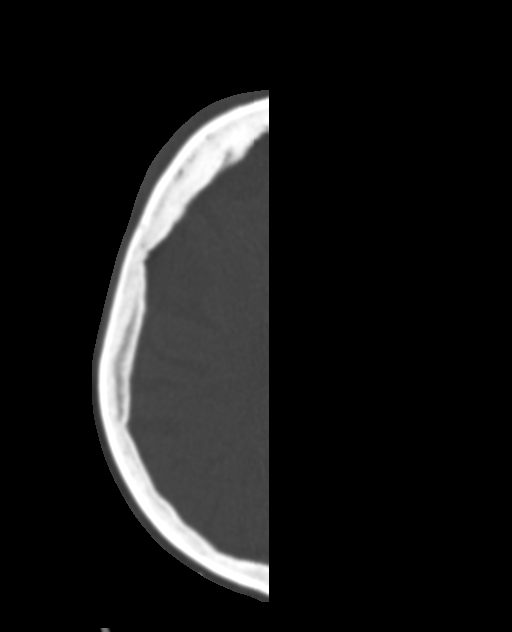

[14 of 47 positions shown; findings below may reference images not displayed]

FINDINGS: CT HEAD FINDINGS

Brain: No evidence of acute infarction, hemorrhage, hydrocephalus,
extra-axial collection or mass lesion/mass effect. Patchy white
matter hypodensities, nonspecific but with chronic microvascular
ischemic disease. Cerebral atrophy.

Vascular: No hyperdense vessel identified calcific intracranial
sclerosis.

Skull: Right forehead contusion without acute calvarial fracture.

Other: No mastoid effusions.

CT MAXILLOFACIAL FINDINGS

Osseous: No fracture or mandibular dislocation. No destructive
process. Edentulous.

Orbits: Negative. No traumatic or inflammatory finding.

Sinuses: Air-fluid levels with frothy secretions in the sphenoid
sinuses. Otherwise, clear sinuses.

Soft tissues: Right forehead contusion.
IMPRESSION: 1. No evidence acute intracranial abnormality or facial fracture.
2. Right forehead contusion without acute calvarial fracture.
3. Air-fluid levels with frothy secretions in the sphenoid sinuses.

## 2023-08-27 ENCOUNTER — Observation Stay (HOSPITAL_COMMUNITY)
Admission: EM | Admit: 2023-08-27 | Discharge: 2023-08-29 | Disposition: A | Discharge status: Skilled Nursing Facility | Attending: Internal Medicine | Admitting: Internal Medicine

## 2023-08-27 ENCOUNTER — Observation Stay (HOSPITAL_COMMUNITY)

## 2023-08-27 ENCOUNTER — Emergency Department (HOSPITAL_COMMUNITY)

## 2023-08-27 ENCOUNTER — Other Ambulatory Visit: Payer: Self-pay

## 2023-08-27 DIAGNOSIS — G35C0 Secondary progressive multiple sclerosis, unspecified: Secondary | ICD-10-CM | POA: Diagnosis present

## 2023-08-27 DIAGNOSIS — Z79899 Other long term (current) drug therapy: Secondary | ICD-10-CM | POA: Insufficient documentation

## 2023-08-27 DIAGNOSIS — I10 Essential (primary) hypertension: Secondary | ICD-10-CM | POA: Diagnosis present

## 2023-08-27 DIAGNOSIS — F0390 Unspecified dementia without behavioral disturbance: Secondary | ICD-10-CM | POA: Diagnosis not present

## 2023-08-27 DIAGNOSIS — Z515 Encounter for palliative care: Secondary | ICD-10-CM

## 2023-08-27 DIAGNOSIS — E872 Acidosis, unspecified: Secondary | ICD-10-CM | POA: Diagnosis present

## 2023-08-27 DIAGNOSIS — R1310 Dysphagia, unspecified: Secondary | ICD-10-CM

## 2023-08-27 DIAGNOSIS — R4182 Altered mental status, unspecified: Secondary | ICD-10-CM | POA: Diagnosis not present

## 2023-08-27 DIAGNOSIS — R569 Unspecified convulsions: Principal | ICD-10-CM

## 2023-08-27 DIAGNOSIS — Z86718 Personal history of other venous thrombosis and embolism: Secondary | ICD-10-CM

## 2023-08-27 DIAGNOSIS — E11641 Type 2 diabetes mellitus with hypoglycemia with coma: Secondary | ICD-10-CM

## 2023-08-27 DIAGNOSIS — G35 Multiple sclerosis: Secondary | ICD-10-CM | POA: Diagnosis not present

## 2023-08-27 DIAGNOSIS — Z7901 Long term (current) use of anticoagulants: Secondary | ICD-10-CM | POA: Insufficient documentation

## 2023-08-27 DIAGNOSIS — I739 Peripheral vascular disease, unspecified: Secondary | ICD-10-CM | POA: Diagnosis not present

## 2023-08-27 DIAGNOSIS — E785 Hyperlipidemia, unspecified: Secondary | ICD-10-CM | POA: Diagnosis present

## 2023-08-27 DIAGNOSIS — E1110 Type 2 diabetes mellitus with ketoacidosis without coma: Secondary | ICD-10-CM | POA: Diagnosis not present

## 2023-08-27 DIAGNOSIS — Z7984 Long term (current) use of oral hypoglycemic drugs: Secondary | ICD-10-CM | POA: Diagnosis not present

## 2023-08-27 DIAGNOSIS — Z8673 Personal history of transient ischemic attack (TIA), and cerebral infarction without residual deficits: Secondary | ICD-10-CM | POA: Diagnosis not present

## 2023-08-27 DIAGNOSIS — E119 Type 2 diabetes mellitus without complications: Secondary | ICD-10-CM

## 2023-08-27 LAB — COMPREHENSIVE METABOLIC PANEL WITH GFR
ALT: 19 U/L (ref 0–44)
AST: 19 U/L (ref 15–41)
Albumin: 3.6 g/dL (ref 3.5–5.0)
Alkaline Phosphatase: 59 U/L (ref 38–126)
Anion gap: 16 — ABNORMAL HIGH (ref 5–15)
BUN: 13 mg/dL (ref 8–23)
CO2: 23 mmol/L (ref 22–32)
Calcium: 9.4 mg/dL (ref 8.9–10.3)
Chloride: 99 mmol/L (ref 98–111)
Creatinine, Ser: 0.69 mg/dL (ref 0.44–1.00)
GFR, Estimated: >60 mL/min (ref >60)
Glucose, Bld: 119 mg/dL — ABNORMAL HIGH (ref 70–99)
Potassium: 4.2 mmol/L (ref 3.5–5.1)
Sodium: 138 mmol/L (ref 135–145)
Total Bilirubin: 0.5 mg/dL (ref 0.0–1.2)
Total Protein: 7.2 g/dL (ref 6.5–8.1)

## 2023-08-27 LAB — CBC WITH DIFFERENTIAL/PLATELET
Abs Immature Granulocytes: 0.07 10*3/uL (ref 0.00–0.07)
Basophils Absolute: 0 10*3/uL (ref 0.0–0.1)
Basophils Relative: 0 %
Eosinophils Absolute: 0 10*3/uL (ref 0.0–0.5)
Eosinophils Relative: 0 %
HCT: 37 % (ref 36.0–46.0)
Hemoglobin: 12.1 g/dL (ref 12.0–15.0)
Immature Granulocytes: 1 %
Lymphocytes Relative: 9 %
Lymphs Abs: 0.6 10*3/uL — ABNORMAL LOW (ref 0.7–4.0)
MCH: 27.4 pg (ref 26.0–34.0)
MCHC: 32.7 g/dL (ref 30.0–36.0)
MCV: 83.7 fL (ref 80.0–100.0)
Monocytes Absolute: 0.6 10*3/uL (ref 0.1–1.0)
Monocytes Relative: 9 %
Neutro Abs: 5.7 10*3/uL (ref 1.7–7.7)
Neutrophils Relative %: 81 %
Platelets: 234 10*3/uL (ref 150–400)
RBC: 4.42 MIL/uL (ref 3.87–5.11)
RDW: 13.7 % (ref 11.5–15.5)
WBC: 7.1 10*3/uL (ref 4.0–10.5)
nRBC: 0 % (ref 0.0–0.2)

## 2023-08-27 LAB — PROTIME-INR
INR: 1 (ref 0.8–1.2)
Prothrombin Time: 13.7 s (ref 11.4–15.2)

## 2023-08-27 LAB — I-STAT VENOUS BLOOD GAS, ED
Acid-Base Excess: 0 mmol/L (ref 0.0–2.0)
Bicarbonate: 25.7 mmol/L (ref 20.0–28.0)
Calcium, Ion: 1.27 mmol/L (ref 1.15–1.40)
HCT: 37 % (ref 36.0–46.0)
Hemoglobin: 12.6 g/dL (ref 12.0–15.0)
O2 Saturation: 84 %
Potassium: 4.1 mmol/L (ref 3.5–5.1)
Sodium: 138 mmol/L (ref 135–145)
TCO2: 27 mmol/L (ref 22–32)
pCO2, Ven: 45.2 mmHg (ref 44–60)
pH, Ven: 7.363 (ref 7.25–7.43)
pO2, Ven: 51 mmHg — ABNORMAL HIGH (ref 32–45)

## 2023-08-27 LAB — I-STAT CG4 LACTIC ACID, ED: Lactic Acid, Venous: 2 mmol/L (ref 0.5–1.9)

## 2023-08-27 LAB — HIV ANTIBODY (ROUTINE TESTING W REFLEX): HIV Screen 4th Generation wRfx: NONREACTIVE

## 2023-08-27 LAB — TROPONIN I (HIGH SENSITIVITY)
Troponin I (High Sensitivity): 22 ng/L — ABNORMAL HIGH (ref <18)
Troponin I (High Sensitivity): 19 ng/L — ABNORMAL HIGH (ref <18)

## 2023-08-27 MED ORDER — SODIUM CHLORIDE 0.9% FLUSH
3.0000 mL | Freq: Two times a day (BID) | INTRAVENOUS | Status: DC
  Administered 2023-08-27 – 2023-08-29 (×4): 3 mL via INTRAVENOUS

## 2023-08-27 MED ORDER — CARBOXYMETHYLCELLULOSE SODIUM 1 % OP SOLN: 1.0000 [drp] | Freq: Two times a day (BID) | OPHTHALMIC | Status: DC

## 2023-08-27 MED ORDER — ACETAMINOPHEN 325 MG PO TABS
650.0000 mg | ORAL_TABLET | Freq: Four times a day (QID) | ORAL | Status: DC | PRN
  Administered 2023-08-27: 650 mg via ORAL
  Filled 2023-08-27: qty 2

## 2023-08-27 MED ORDER — POLYVINYL ALCOHOL 1.4 % OP SOLN
1.0000 [drp] | Freq: Two times a day (BID) | OPHTHALMIC | Status: DC
  Administered 2023-08-28 – 2023-08-29 (×3): 1 [drp] via OPHTHALMIC
  Filled 2023-08-27: qty 15

## 2023-08-27 MED ORDER — CLONIDINE HCL 0.1 MG PO TABS: 0.1000 mg | ORAL_TABLET | Freq: Every day | ORAL | Status: DC | PRN

## 2023-08-27 MED ORDER — GABAPENTIN 400 MG PO CAPS
400.0000 mg | ORAL_CAPSULE | Freq: Three times a day (TID) | ORAL | Status: DC
  Administered 2023-08-27 – 2023-08-29 (×6): 400 mg via ORAL
  Filled 2023-08-27 (×6): qty 1

## 2023-08-27 MED ORDER — PROPYLENE GLYCOL 0.6 % OP SOLN: 1.0000 [drp] | Freq: Two times a day (BID) | OPHTHALMIC | Status: DC

## 2023-08-27 MED ORDER — ATORVASTATIN CALCIUM 10 MG PO TABS
5.0000 mg | ORAL_TABLET | Freq: Every evening | ORAL | Status: DC
  Administered 2023-08-27 – 2023-08-29 (×3): 5 mg via ORAL
  Filled 2023-08-27 (×3): qty 1

## 2023-08-27 MED ORDER — SODIUM CHLORIDE 0.9 % IV BOLUS
1000.0000 mL | Freq: Once | INTRAVENOUS | Status: AC
Start: 2023-08-27 — End: 2023-08-27
  Administered 2023-08-27: 1000 mL via INTRAVENOUS

## 2023-08-27 MED ORDER — LORAZEPAM 2 MG/ML IJ SOLN: 1.0000 mg | INTRAMUSCULAR | Status: DC | PRN

## 2023-08-27 MED ORDER — SODIUM CHLORIDE 0.9 % IV SOLN: INTRAVENOUS | Status: AC

## 2023-08-27 MED ORDER — LEVETIRACETAM IN NACL 500 MG/100ML IV SOLN
500.0000 mg | Freq: Two times a day (BID) | INTRAVENOUS | Status: DC
  Administered 2023-08-28 – 2023-08-29 (×3): 500 mg via INTRAVENOUS
  Filled 2023-08-27 (×3): qty 100

## 2023-08-27 MED ORDER — ACETAMINOPHEN 650 MG RE SUPP: 650.0000 mg | Freq: Four times a day (QID) | RECTAL | Status: DC | PRN

## 2023-08-27 MED ORDER — ALBUTEROL SULFATE (2.5 MG/3ML) 0.083% IN NEBU: 2.5000 mg | INHALATION_SOLUTION | Freq: Four times a day (QID) | RESPIRATORY_TRACT | Status: DC | PRN

## 2023-08-27 MED ORDER — LATANOPROST 0.005 % OP SOLN
1.0000 [drp] | Freq: Every day | OPHTHALMIC | Status: DC
  Administered 2023-08-28: 1 [drp] via OPHTHALMIC
  Filled 2023-08-27: qty 2.5

## 2023-08-27 MED ORDER — ONDANSETRON HCL 4 MG PO TABS
4.0000 mg | ORAL_TABLET | Freq: Four times a day (QID) | ORAL | Status: DC | PRN
Start: 2023-08-27 — End: 2023-08-30

## 2023-08-27 MED ORDER — BOOST / RESOURCE BREEZE PO LIQD CUSTOM
1.0000 | Freq: Three times a day (TID) | ORAL | Status: DC
  Administered 2023-08-28 – 2023-08-29 (×3): 1 via ORAL

## 2023-08-27 MED ORDER — RIVAROXABAN 20 MG PO TABS
20.0000 mg | ORAL_TABLET | Freq: Every evening | ORAL | Status: DC
  Administered 2023-08-28 – 2023-08-29 (×2): 20 mg via ORAL
  Filled 2023-08-27 (×3): qty 1

## 2023-08-27 MED ORDER — METOPROLOL TARTRATE 50 MG PO TABS
100.0000 mg | ORAL_TABLET | Freq: Two times a day (BID) | ORAL | Status: DC
  Administered 2023-08-27 – 2023-08-29 (×4): 100 mg via ORAL
  Filled 2023-08-27 (×4): qty 2

## 2023-08-27 MED ORDER — SODIUM CHLORIDE 0.9 % IV SOLN
3500.0000 mg | Freq: Once | INTRAVENOUS | Status: AC
  Administered 2023-08-27: 3500 mg via INTRAVENOUS
  Filled 2023-08-27: qty 35

## 2023-08-27 MED ORDER — BOOST GLUCOSE CONTROL PO LIQD: 237.0000 mL | Freq: Three times a day (TID) | ORAL | Status: DC

## 2023-08-27 MED ORDER — ONDANSETRON HCL 4 MG/2ML IJ SOLN
4.0000 mg | Freq: Four times a day (QID) | INTRAMUSCULAR | Status: DC | PRN
Start: 2023-08-27 — End: 2023-08-30

## 2023-08-27 NOTE — Consult Note (Signed)
 NEUROLOGY CONSULT NOTE   Date of service: August 27, 2023 Patient Name: [name] MRN:  [id number] DOB:  [birth year] Chief Complaint: "seizures, AMS" Requesting Provider: Lena Qualia, MD  History of Present Illness  [name] is a 68 y.o. female with hx of MS, CVA, dementia, HTN, neuropathic pain, vitamin D deficiency who who was brought in by EMS from her skilled nursing facility after seizure-like activity.  Per facility, patient was last seen normal at breakfast this morning that staff.  3 separate convulsive episodes.  Patient had seizure activity on EMSs arrival and she was given 5 mg of Versed.  She was obtunded and unable to provide history on ED admission.  Neurology consulted for seizure activity.  On neurology exam, patient continues to be obtunded she does open eyes to voice.  She does not track examiner, she does not follow commands, no verbal output.  She is contracted in all extremities.  She does withdraw to pain in all extremities.  CT head without acute abnormality.   Per chart review, patient has no history of prior seizures.  She was diagnosed with MS in 1977 after experiencing headaches.  Previous neurology notes in 2016 note chronic left-sided weakness requiring dependence of wheelchair since 2014, urinary incontinence since same year.  No other neurology follow-up notes are seen in chart.  Patient's MOST form does indicate comfort measures, but also indicates antibiotics, IV fluids and feeding tube if indicated.  EDP is attempting to reach family to clarify goals of care.    ROS   Unable to ascertain due to AMS  Past History   Past Medical History:  Diagnosis Date   Allergic rhinitis    Blood dyscrasia    Dementia (HCC)    Depression    Diabetes mellitus    Dysmetabolic syndrome X    Family history of anesthesia complication    brother "   Hypertension    Hypokalemia    Multiple sclerosis (HCC)    Neuropathic pain    Urinary incontinence     Vitamin D deficiency     Past Surgical History:  Procedure Laterality Date   ABDOMINAL HYSTERECTOMY     CESAREAN SECTION      Family History: Family History  Problem Relation Age of Onset   Multiple sclerosis Brother     Social History  reports that she has never smoked. She has never used smokeless tobacco. She reports current alcohol use. She reports that she does not use drugs.  No Known Allergies  Medications   Current Facility-Administered Medications:    levETIRAcetam (KEPPRA) 3,500 mg in sodium chloride 0.9 % 250 mL IVPB, 3,500 mg, Intravenous, Once, Young, Travis J, DO  Current Outpatient Medications:    acetaminophen (TYLENOL) 325 MG tablet, Take 650 mg by mouth in the morning, at noon, and at bedtime. DO NOT EXCEED 3,000 MG IN HOURS, Disp: , Rfl:    ammonium lactate (LAC-HYDRIN) 12 % lotion, Apply 1 application  topically See admin instructions. Apply topically to bilateral feet on the 23rd day of each month, every 30 days., Disp: , Rfl:    atorvastatin (LIPITOR) 10 MG tablet, Take 5 mg by mouth every evening., Disp: , Rfl:    carboxymethylcellulose 1 % ophthalmic solution, Place 1 drop into both eyes 2 (two) times daily., Disp: , Rfl:    Cholecalciferol (VITAMIN D3) 10 MCG (400 UNIT) tablet, Take 800 Units by mouth daily., Disp: , Rfl:    cloNIDine (CATAPRES) 0.1 MG  tablet, Take 0.1 mg by mouth See admin instructions. Give 1 tablet (0.1mg ) by mouth once daily as needed for SBP > 180 or DBP > 95., Disp: , Rfl:    Cranberry 450 MG TABS, Take 450 mg by mouth 2 (two) times daily., Disp: , Rfl:    ferrous sulfate 325 (65 FE) MG tablet, Take 1 tablet (325 mg total) by mouth daily with breakfast. (Patient taking differently: Take 325 mg by mouth daily.), Disp: 30 tablet, Rfl: 3   gabapentin (NEURONTIN) 400 MG capsule, Take 400 mg by mouth 3 (three) times daily., Disp: , Rfl:    latanoprost (XALATAN) 0.005 % ophthalmic solution, Place 1 drop into both eyes at bedtime., Disp: ,  Rfl:    metFORMIN (GLUCOPHAGE) 500 MG tablet, Take 500 mg by mouth daily., Disp: , Rfl:    metoprolol tartrate (LOPRESSOR) 100 MG tablet, Take 100 mg by mouth 2 (two) times daily., Disp: , Rfl:    Nutritional Supplements (BOOST GLUCOSE CONTROL) LIQD, Take 237 mLs by mouth with breakfast, with lunch, and with evening meal., Disp: , Rfl:    polyethylene glycol (MIRALAX / GLYCOLAX) 17 g packet, Take 17 g by mouth daily as needed for mild constipation., Disp: , Rfl:    rivaroxaban (XARELTO) 20 MG TABS tablet, Take 1 tablet (20 mg total) by mouth daily with supper. (Patient taking differently: Take 20 mg by mouth every evening.), Disp: , Rfl:    sennosides-docusate sodium (SENOKOT-S) 8.6-50 MG tablet, Take 2 tablets by mouth every evening., Disp: , Rfl:    SYSTANE BALANCE 0.6 % SOLN, Place 1 drop into both eyes 2 (two) times daily., Disp: , Rfl:   Vitals   Vitals:   2023/08/30 1233  BP: 102/81  Pulse: 82  Resp: 14  Temp: 99.1 F (37.3 C)  SpO2: 100%    There is no height or weight on file to calculate BMI.  Physical Exam   Constitutional: Appears chronically ill.  Cardiovascular: Normal rate and regular rhythm.  Respiratory: shallow, non-labored breathing.   Neurologic Examination   Patient is obtunded. She does open eyes to voice.  She does not follow commands she does not track examiner.  She does intermittently blink to threat bilaterally.  She does have a disconjugate gaze. No verbal output.  She does not follow commands Contracted in all extremities Does not withdrawal to noxious stimuli in all extremities.  Labs/Imaging/Neurodiagnostic studies   CBC:  Recent Labs  Lab 08-30-2023 1230 08-30-23 1240  WBC 7.1  --   NEUTROABS 5.7  --   HGB 12.1 12.6  HCT 37.0 37.0  MCV 83.7  --   PLT 234  --    Basic Metabolic Panel:  Lab Results  Component Value Date   NA 138 Aug 30, 2023   K 4.1 Aug 30, 2023   CO2 23 30-Aug-2023   GLUCOSE 119 (H) 08/30/2023   BUN 13 08/30/2023    CREATININE 0.69 2023/08/30   CALCIUM 9.4 08-30-23   GFRNONAA >60 Aug 30, 2023   GFRAA >60 07/08/2019   Lipid Panel: No results found for: "LDLCALC" HgbA1c:  Lab Results  Component Value Date   HGBA1C 6.1 (H) 12/29/2011   Urine Drug Screen:     Component Value Date/Time   LABOPIA NONE DETECTED 09/11/2021 0003   COCAINSCRNUR NONE DETECTED 09/11/2021 0003   LABBENZ NONE DETECTED 09/11/2021 0003   AMPHETMU NONE DETECTED 09/11/2021 0003   THCU NONE DETECTED 09/11/2021 0003   LABBARB NONE DETECTED 09/11/2021 0003    Alcohol Level No  results found for: "ETH" INR  Lab Results  Component Value Date   INR 1.0 08/27/2023   APTT  Lab Results  Component Value Date   APTT 101 (H) 11/24/2017   AED levels: No results found for: "PHENYTOIN", "ZONISAMIDE", "LAMOTRIGINE", "LEVETIRACETA"  CT Head without contrast(Personally reviewed): No evidence of acute intracranial abnormality. Advanced chronic small vessel ischemic disease.  Neurodiagnostics rEEG:  PENDING  ASSESSMENT   [name] is a 68 y.o. female with hx of MS, CVA, dementia, HTN, neuropathic pain, vitamin D deficiency who who was brought in by EMS from her skilled nursing facility after  3 separate witnessed convulsive episodes.  Patient had seizure activity on EMS arrival, given 5 mg of Versed.  She was obtunded and unable to provide history on ED admission.   On neurology exam, patient continues to be obtunded she does open eyes to voice.  She does not track examiner, she does not follow commands, no verbal output.  She is contracted in all extremities.  She does withdraw to pain in all extremities.  CT head without acute abnormality.  Elevated lactate, which could stem from seizure activity.  Afebrile, no leukocytosis.  No seizure history. Diagnosed with MS in 1977.   RECOMMENDATIONS   - routine EEG - Keppra load followed by 500 mg twice  daily  ______________________________________________________________________  Signed, Audrene Lease, NP [facility]    I have seen the patient reviewed the above note.  This patient presented with seizure from her nursing home.  Her MOST form indicates comfort care, however it also indicates that she would want a feeding tube if indicated and therefore there is discordance in her wishes.  Efforts to contact family were unsuccessful.  I would therefore recommend working her up for seizure pending further clarification with family's desires.    Ann Keto, MD [facility]  If 7pm- 7am, please page neurology on call as listed in AMION.

## 2023-08-27 NOTE — Procedures (Signed)
 Routine EEG Report  [name] is a 68 y.o. female with a history of seizure who is undergoing an EEG to evaluate for seizures.  Report: This EEG was acquired with electrodes placed according to the International 10-20 electrode system (including Fp1, Fp2, F3, F4, C3, C4, P3, P4, O1, O2, T3, T4, T5, T6, A1, A2, Fz, Cz, Pz). The following electrodes were missing or displaced: none.  The background showed moderate diffuse slowing at 5 Hz with overriding beta frequencies. This activity is reactive to stimulation. There was no clear waking rhythm. Sleep was identified by K complexes and sleep spindles. There was no focal slowing. There were no interictal epileptiform discharges. There were no electrographic seizures identified. Photic stimulation and hyperventilation were not performed.   Impression and clinical correlation: This EEG was obtained while asleep and is abnormal due to moderate diffuse slowing indicative of global cerebral dysfunction, medication effect, or both. Epileptiform abnormalities were not seen during this recording.  Duration of study 29 minutes  Greg Leaks, MD [facility] [phone]  If 7pm- 7am, please page neurology on call as listed in AMION.

## 2023-08-27 NOTE — ED Provider Notes (Signed)
 [facility] Provider Note   CSN: [id number] Arrival date & time: 08/27/23  1205     History  Chief Complaint  Patient presents with   Altered Mental Status   Seizures    [name] is a 68 y.o. female.  68 year old female presenting emergency department after seizure-like activity at her skilled nursing facility.  No history of seizures.  Reportedly last seen normal at breakfast this morning.  Then staff noted convulsive episodes x 3.  EMS with seizure activity on their arrival and gave 5 mg of Versed.  Patient is obtunded and unable provide history.  See ED course for further HPI   Altered Mental Status Associated symptoms: seizures   Seizures      Home Medications Prior to Admission medications   Medication Sig Start Date End Date Taking? Authorizing Provider  acetaminophen (TYLENOL) 325 MG tablet Take 650 mg by mouth in the morning, at noon, and at bedtime. DO NOT EXCEED 3,000 MG IN HOURS   Yes [provider]  ammonium lactate (LAC-HYDRIN) 12 % lotion Apply 1 application. topically every 30 (thirty) days.    [provider]  atorvastatin (LIPITOR) 10 MG tablet Take 5 mg by mouth every evening.    [provider]  cloNIDine (CATAPRES) 0.1 MG tablet Take 0.1 mg by mouth See admin instructions. Give 1 tablet by mouth as needed for sBP greater than 180 or dBP greater than 95    [provider]  Cranberry 450 MG TABS Take 450 mg by mouth 2 (two) times daily.    [provider]  ferrous sulfate 325 (65 FE) MG tablet Take 1 tablet (325 mg total) by mouth daily with breakfast. Patient taking differently: Take 325 mg by mouth daily with supper. 11/15/17   Colin Dawley, MD  gabapentin (NEURONTIN) 400 MG capsule Take 400 mg by mouth 3 (three) times daily.    [provider]  latanoprost (XALATAN) 0.005 % ophthalmic solution Place 1 drop into both eyes at bedtime. 11/24/22    [provider]  metFORMIN (GLUCOPHAGE) 500 MG tablet Take 500 mg by mouth daily with breakfast.    [provider]  metoprolol tartrate (LOPRESSOR) 100 MG tablet Take 100 mg by mouth 2 (two) times daily. 03/15/22   [provider]  polyethylene glycol (MIRALAX / GLYCOLAX) 17 g packet Take 17 g by mouth daily as needed for mild constipation.    [provider]  rivaroxaban (XARELTO) 20 MG TABS tablet Take 1 tablet (20 mg total) by mouth daily with supper. 12/16/17   Wynetta Heckle, MD  sennosides-docusate sodium (SENOKOT-S) 8.6-50 MG tablet Take 2 tablets by mouth at bedtime.    [provider]  SYSTANE BALANCE 0.6 % SOLN Place 1 drop into both eyes 2 (two) times daily.    [provider]      Allergies    Patient has no known allergies.    Review of Systems   Review of Systems  Neurological:  Positive for seizures.    Physical Exam Updated Vital Signs BP 102/81   Pulse 82   Temp 99.1 F (37.3 C)   Resp 14   SpO2 100%  Physical Exam Vitals and nursing note reviewed.  Constitutional:      Appearance: She is ill-appearing.  HENT:     Head: Normocephalic and atraumatic.     Nose: Nose normal.     Mouth/Throat:     Mouth: Mucous  membranes are moist.  Eyes:     Pupils: Pupils are equal, round, and reactive to light.  Cardiovascular:     Rate and Rhythm: Normal rate and regular rhythm.  Pulmonary:     Effort: Pulmonary effort is normal.     Breath sounds: Normal breath sounds.  Abdominal:     General: Abdomen is flat. There is no distension.     Tenderness: There is no abdominal tenderness. There is no guarding or rebound.  Musculoskeletal:     Cervical back: Neck supple.     Right lower leg: No edema.     Left lower leg: No edema.  Skin:    General: Skin is warm.     Capillary Refill: Capillary refill takes less than 2 seconds.  Neurological:     Comments: Difficult to assess given patient's mentation/postictal.   Withdraws left upper and bilateral lower extremities to painful stimuli.  Right upper extremity with flicker of movement.  Will open her eyes to touch/voice, but not following commands.  Psychiatric:        Mood and Affect: Mood normal.        Behavior: Behavior normal.     ED Results / Procedures / Treatments   Labs (all labs ordered are listed, but only abnormal results are displayed) Labs Reviewed  COMPREHENSIVE METABOLIC PANEL WITH GFR - Abnormal; Notable for the following components:      Result Value   Glucose, Bld 119 (*)    Anion gap 16 (*)    All other components within normal limits  CBC WITH DIFFERENTIAL/PLATELET - Abnormal; Notable for the following components:   Lymphs Abs 0.6 (*)    All other components within normal limits  I-STAT CG4 LACTIC ACID, ED - Abnormal; Notable for the following components:   Lactic Acid, Venous 2.0 (*)    All other components within normal limits  I-STAT VENOUS BLOOD GAS, ED - Abnormal; Notable for the following components:   pO2, Ven 51 (*)    All other components within normal limits  CULTURE, BLOOD (ROUTINE X 2)  CULTURE, BLOOD (ROUTINE X 2)  PROTIME-INR  URINALYSIS, W/ REFLEX TO CULTURE (INFECTION SUSPECTED)  CBG MONITORING, ED  I-STAT CG4 LACTIC ACID, ED  TROPONIN I (HIGH SENSITIVITY)    EKG EKG Interpretation Date/Time:  Saturday August 27 2023 12:14:13 EDT Ventricular Rate:  86 PR Interval:  187 QRS Duration:  73 QT Interval:  378 QTC Calculation: 453 R Axis:   37  Text Interpretation: Sinus rhythm Abnormal R-wave progression, early transition Baseline wander in lead(s) II III aVL aVF Confirmed by Elise Guile [phone]) on 08/27/2023 12:33:19 PM  Radiology DG Chest Port 1 View Result Date: 08/27/2023 CLINICAL DATA:  Questionable sepsis - evaluate for abnormality EXAM: PORTABLE CHEST 1 VIEW COMPARISON:  January 02, 2023 FINDINGS: Low lung volumes with elevation of the left hemidiaphragm. No focal airspace consolidation,  pleural effusion, or pneumothorax. No cardiomegaly. Osteopenia. Multilevel degenerative disc disease of the spine. No acute fracture or destructive lesion. IMPRESSION: Low lung volumes.  Otherwise, no acute cardiopulmonary abnormality. Electronically Signed   By: Rance Burrows M.D.   On: 08/27/2023 13:00   CT Head Wo Contrast Result Date: 08/27/2023 CLINICAL DATA:  Mental status change, unknown cause.  Seizures. EXAM: CT HEAD WITHOUT CONTRAST TECHNIQUE: Contiguous axial images were obtained from the base of the skull through the vertex without intravenous contrast. RADIATION DOSE REDUCTION: This exam was performed according to the departmental dose-optimization program which includes automated exposure  control, adjustment of the mA and/or kV according to patient size and/or use of iterative reconstruction technique. COMPARISON:  Head CT 01/02/2023 FINDINGS: The study is mildly motion degraded. Brain: There is no evidence of an acute infarct, intracranial hemorrhage, mass, midline shift, or extra-axial fluid collection. There is moderate cerebral atrophy. Confluent hypodensities in the cerebral white matter are similar to the prior CT and are nonspecific but compatible with advanced chronic small vessel ischemic disease. Vascular: Calcified atherosclerosis at the skull base. No hyperdense vessel. Skull: No acute fracture or suspicious lesion. Sinuses/Orbits: Paranasal sinuses and mastoid air cells are clear. Unremarkable orbits. Other: None. IMPRESSION: 1. No evidence of acute intracranial abnormality. 2. Advanced chronic small vessel ischemic disease. Electronically Signed   By: Sebastian Ache M.D.   On: 08/27/2023 12:41    Procedures Procedures    Medications Ordered in ED Medications  levETIRAcetam (KEPPRA) 3,500 mg in sodium chloride 0.9 % 250 mL IVPB (has no administration in time range)  sodium chloride 0.9 % bolus 1,000 mL (1,000 mLs Intravenous New Bag/Given 08/27/23 1251)    ED Course/ Medical  Decision Making/ A&P Clinical Course as of 08/27/23 1347  Sat Aug 27, 2023  1216 Last seen normal at breakfast, but unclear what time that was.  EMS reported that staff at facility noted 3 convulsive type episodes.  EMS noted some repetitive facial twitching and gave 5 mg of Versed.  Patient will localize to pain with her left upper extremity, none to her right.  EMS reported history of MS and prior stroke unclear what deficits are. No obvious infectious process. Attempted to call family for GOC. No answer. Will try again. Will get broad lab workup and CT head.  [TY]  1246 Slowly improving.  SHe is now moving bilateral lower extremities to painful stimuli, will open eyes to verbal and painful stimuli. [TY]  1258 CT Head Wo Contrast IMPRESSION: 1. No evidence of acute intracranial abnormality. 2. Advanced chronic small vessel ischemic disease.   Electronically Signed   By: Sebastian Ache M.D.   On: 08/27/2023 12:41   [TY]  1308 Spoke with Dr. Neurology, Dr. Anise Salvo given patient's MOST form which comfort measures, but also indicates antibiotics, IV fluids and feeding tube if indicated.  Unable to reach family to clarify goals of care.  Recommending load with Keppra and EEG. Not TNK candidate.  [TY]  1332 Patient will open eyes to light touch and voice, still not following commands [TY]  1333 Workup [TY]  1333 On my independent largely reassuring.  CT head without acute hemorrhage on my independent interpretation.  Chest x-ray without overt pneumonia/pneumothorax.  No leukocytosis or fever to suggest infectious process.  Comprehensive panel without metabolic derangements that would explain seizure.  Does have mildly elevated lactate of 2 which coincide with seizure.  VBG reassuring.  EKG without ST segment changes that would indicate ischemia.  Troponin in process.  Given patient's prolonged postictal/altered mental status will admit for EEG and further monitoring. [TY]  1343 PMH per chart review:  ' MS, cerebral infarction, cerebrovascular disease, hemiplegia, T2DM, DVT, depression, hypertension. " [TY]    Clinical Course User Index [TY] Coral Spikes, DO                                 Medical Decision Making 68 year old female with complex past medical history as noted in ED course presented to the emergency department after what sounds  like new onset seizures.  Received Versed with EMS prior to arrival.  Has a MOST form with her with no CPR, comfort measures, IV fluids, antibiotics and feeding tube.  Attempted to call family several times and have left a voicemail, but have not been able to get in contact with them.  Broad workup as noted in ED course reassuring.  Case discussed with neurology; see ED course.  Loaded with Keppra.  Plan for admission.  Amount and/or Complexity of Data Reviewed Independent Historian: EMS    Details: Tonic-clonic activity on arrival.  Gave Versed External Data Reviewed:     Details: No history of seizures, does follow with palliative care Labs: ordered. Decision-making details documented in ED Course. Radiology: ordered and independent interpretation performed. Decision-making details documented in ED Course. ECG/medicine tests: independent interpretation performed.  Risk Decision regarding hospitalization.          Final Clinical Impression(s) / ED Diagnoses Final diagnoses:  Altered mental status, unspecified altered mental status type  Seizure [facility]    Rx / DC Orders ED Discharge Orders     None         Rolinda Climes, DO 08/27/23 1347

## 2023-08-27 NOTE — H&P (Signed)
 History and Physical    Patient: [name] WJX:[id number] DOB: 02/11/1956 DOA: 08/27/2023 DOS: the patient was seen and examined on 08/27/2023 PCP: Brendalyn Calkins, MD  Patient coming from: [facility] via EMS  Chief Complaint:  Chief Complaint  Patient presents with   Altered Mental Status   Seizures   HPI: [name] is a 68 y.o. female with medical history significant ofadvanced MS with severe functional impairment/bedbound at baseline/dependent for all ADLs/nursing home resident, bedbound, dysphagia, type 2 diabetes, hypertension, hyperlipidemia, DVT/PE on Xarelto, and depression who presents after being noted to have 3 separate episodes seizure activity upon EMS arrival.  Patient had been given 5 mg of IM Versed.  This time the patient is not able to provide much history.  At baseline patient's sister Ms. Faison reports that the patient has been bedbound for several years and has dementia for which she is usually alert and oriented to self and on occasional days may be oriented to place.  In the emergency department patient was noted to be afebrile with stable vital signs.  Labs significant for CO2 23, glucose 119, anion gap 16, and lactic acid 2.  Chest x-ray noted low lung volumes without acute abnormality.  CT scan of the head noted no evidence of any acute abnormality.  Blood cultures were obtained.  Neurology had been formally consulted.  Patient was loaded with 3.5 g Keppra IV and given 1 L normal saline IV fluids.   Review of Systems: Unable to review all systems due to lack of cooperation from patient. Past Medical History:  Diagnosis Date   Allergic rhinitis    Blood dyscrasia    Dementia (HCC)    Depression    Diabetes mellitus    Dysmetabolic syndrome X    Family history of anesthesia complication    brother "   Hypertension    Hypokalemia    Multiple sclerosis (HCC)    Neuropathic pain    Urinary incontinence    Vitamin D deficiency    Past Surgical  History:  Procedure Laterality Date   ABDOMINAL HYSTERECTOMY     CESAREAN SECTION     Social History:  reports that she has never smoked. She has never used smokeless tobacco. She reports current alcohol use. She reports that she does not use drugs.  No Known Allergies  Family History  Problem Relation Age of Onset   Multiple sclerosis Brother     Prior to Admission medications   Medication Sig Start Date End Date Taking? Authorizing Provider  acetaminophen (TYLENOL) 325 MG tablet Take 650 mg by mouth in the morning, at noon, and at bedtime. DO NOT EXCEED 3,000 MG IN HOURS   Yes [provider]  ammonium lactate (LAC-HYDRIN) 12 % lotion Apply 1 application  topically See admin instructions. Apply topically to bilateral feet on the 23rd day of each month, every 30 days.   Yes [provider]  atorvastatin (LIPITOR) 10 MG tablet Take 5 mg by mouth every evening.   Yes [provider]  carboxymethylcellulose 1 % ophthalmic solution Place 1 drop into both eyes 2 (two) times daily.   Yes [provider]  Cholecalciferol (VITAMIN D3) 10 MCG (400 UNIT) tablet Take 800 Units by mouth daily.   Yes [provider]  cloNIDine (CATAPRES) 0.1 MG tablet Take 0.1 mg by mouth See admin instructions. Give 1 tablet (0.1mg ) by mouth once daily as needed for SBP > 180 or DBP > 95.   Yes  [provider]  Cranberry 450 MG TABS Take 450 mg by mouth 2 (two) times daily.   Yes [provider]  ferrous sulfate 325 (65 FE) MG tablet Take 1 tablet (325 mg total) by mouth daily with breakfast. Patient taking differently: Take 325 mg by mouth daily. 11/15/17  Yes Emokpae, Courage, MD  gabapentin (NEURONTIN) 400 MG capsule Take 400 mg by mouth 3 (three) times daily.   Yes [provider]  latanoprost (XALATAN) 0.005 % ophthalmic solution Place 1 drop into both eyes at bedtime. 11/24/22  Yes [provider]  metFORMIN (GLUCOPHAGE) 500 MG tablet  Take 500 mg by mouth daily.   Yes [provider]  metoprolol tartrate (LOPRESSOR) 100 MG tablet Take 100 mg by mouth 2 (two) times daily. 03/15/22  Yes [provider]  Nutritional Supplements (BOOST GLUCOSE CONTROL) LIQD Take 237 mLs by mouth with breakfast, with lunch, and with evening meal.   Yes [provider]  polyethylene glycol (MIRALAX / GLYCOLAX) 17 g packet Take 17 g by mouth daily as needed for mild constipation.   Yes [provider]  rivaroxaban (XARELTO) 20 MG TABS tablet Take 1 tablet (20 mg total) by mouth daily with supper. Patient taking differently: Take 20 mg by mouth every evening. 12/16/17  Yes Wynetta Heckle, MD  sennosides-docusate sodium (SENOKOT-S) 8.6-50 MG tablet Take 2 tablets by mouth every evening.   Yes [provider]  SYSTANE BALANCE 0.6 % SOLN Place 1 drop into both eyes 2 (two) times daily.   Yes [provider]    Physical Exam: Vitals:   08/27/23 1233  BP: 102/81  Pulse: 82  Resp: 14  Temp: 99.1 F (37.3 C)  SpO2: 100%    Constitutional: Elderly female who appears to be lethargic but in no acute distress at this time Eyes: PERRL, lids and conjunctivae normal ENMT: Mucous membranes are moist.  Neck: normal, supple,  Respiratory: clear to auscultation bilaterally, no wheezing, no crackles. Normal respiratory effort. No accessory muscle use.  Cardiovascular: Regular rate and rhythm, no murmurs / rubs / gallops. No extremity edema. 2+ pedal pulses. No carotid bruits.  Abdomen: no tenderness, no masses palpated.   Bowel sounds positive.  Musculoskeletal: no clubbing / cyanosis.  Poor muscle tone of the lower extremities..  Skin: no rashes, lesions, ulcers.   Neurologic: CN 2-12 grossly intact.  Patient able to move upper extremities. Psychiatric: Normal judgment and insight. Alert and oriented x 3. Normal mood.   Data Reviewed:  EKG reveals sinus rhythm at 86 bpm with abnormal R wave progression.   Reviewed labs, imaging, and pertinent records as documented.  Assessment and Plan:  Seizures Patient presents after having 3 separate witnessed seizures en route with EMS.  No prior history of seizures reported previously per family.  CT scan of the head did not note any acute abnormality with advanced chronic small vessel ischemic disease present. - Admit to a telemetry bed - Seizure precautions - Follow-up EEG - Ativan IV as needed for seizure activity - Neurology consulted, will follow-up for any further recommendations.  Lactic acidosis Acute.  Lactic acid elevated at 2.  Suspect secondary to seizure activity. - Continue to monitor  Dysphagia Patient previously had been on a dysphagia 1 diet. - Dysphagia 1 diet with assistance - Speech therapy consulted  Advanced multiple sclerosis Debility  At baseline patient is bedbound and needs assistance with all of ADLs.  Dementia Family notes that at baseline patient is alert  and oriented to self on most days.  Diabetes mellitus type 2, without long-term use of insulin On admission glucose noted to be 119.  Home medication regimen includes metformin.  No recent hemoglobin A1c available. - Hypoglycemic protocols - Hold metformin  Essential hypertension Blood pressures noted to be 102/81-133/98. - Continue current medication regimen  Hyperlipidemia - Continue atorvastatin  History of DVT/PE - Continue Xarelto  DVT prophylaxis: Xarelto Advance Care Planning:   Code Status: Full Code   Consults: Neurology  Family Communication: Patient's sister updated over the phone.  Severity of Illness: The appropriate patient status for this patient is OBSERVATION. Observation status is judged to be reasonable and necessary in order to provide the required intensity of service to ensure the patient's safety. The patient's presenting symptoms, physical exam findings, and initial radiographic and laboratory data in the context of their  medical condition is felt to place them at decreased risk for further clinical deterioration. Furthermore, it is anticipated that the patient will be medically stable for discharge from the hospital within 2 midnights of admission.   Author: Lena Qualia, MD 08/27/2023 2:06 PM  For on call review [email or web address].

## 2023-08-27 NOTE — Progress Notes (Signed)
 EEG completed, awaiting results

## 2023-08-27 NOTE — ED Triage Notes (Signed)
 Pt bib ems from [facility] with reports of new onset seizures. Per ems, pt had 3 episodes of convulsion like seizure activity prior to their arrival on scene. Pt was still seizing on their arrival. Given 5mg  IM versed. Pt arrives [facility].

## 2023-08-27 NOTE — ED Notes (Signed)
 EEG at the bedside  ?

## 2023-08-28 DIAGNOSIS — R569 Unspecified convulsions: Secondary | ICD-10-CM | POA: Diagnosis not present

## 2023-08-28 DIAGNOSIS — R4182 Altered mental status, unspecified: Secondary | ICD-10-CM | POA: Diagnosis not present

## 2023-08-28 DIAGNOSIS — G35 Multiple sclerosis: Secondary | ICD-10-CM | POA: Diagnosis not present

## 2023-08-28 DIAGNOSIS — E872 Acidosis, unspecified: Secondary | ICD-10-CM | POA: Diagnosis not present

## 2023-08-28 DIAGNOSIS — I739 Peripheral vascular disease, unspecified: Secondary | ICD-10-CM | POA: Diagnosis not present

## 2023-08-28 DIAGNOSIS — R1310 Dysphagia, unspecified: Secondary | ICD-10-CM | POA: Diagnosis not present

## 2023-08-28 DIAGNOSIS — F0390 Unspecified dementia without behavioral disturbance: Secondary | ICD-10-CM | POA: Diagnosis not present

## 2023-08-28 LAB — BASIC METABOLIC PANEL WITH GFR
Anion gap: 11 (ref 5–15)
BUN: 12 mg/dL (ref 8–23)
CO2: 21 mmol/L — ABNORMAL LOW (ref 22–32)
Calcium: 8.9 mg/dL (ref 8.9–10.3)
Chloride: 103 mmol/L (ref 98–111)
Creatinine, Ser: 0.57 mg/dL (ref 0.44–1.00)
GFR, Estimated: >60 mL/min (ref >60)
Glucose, Bld: 123 mg/dL — ABNORMAL HIGH (ref 70–99)
Potassium: 3.9 mmol/L (ref 3.5–5.1)
Sodium: 135 mmol/L (ref 135–145)

## 2023-08-28 LAB — CBC
HCT: 36.6 % (ref 36.0–46.0)
Hemoglobin: 11.7 g/dL — ABNORMAL LOW (ref 12.0–15.0)
MCH: 26.9 pg (ref 26.0–34.0)
MCHC: 32 g/dL (ref 30.0–36.0)
MCV: 84.1 fL (ref 80.0–100.0)
Platelets: 167 10*3/uL (ref 150–400)
RBC: 4.35 MIL/uL (ref 3.87–5.11)
RDW: 13.8 % (ref 11.5–15.5)
WBC: 4.8 10*3/uL (ref 4.0–10.5)
nRBC: 0 % (ref 0.0–0.2)

## 2023-08-28 NOTE — Hospital Course (Addendum)
 Taken from H&P.   [name] is a 68 y.o. female with medical history significant ofadvanced MS with severe functional impairment/bedbound at baseline/dependent for all ADLs/nursing home resident, bedbound, dysphagia, type 2 diabetes, hypertension, hyperlipidemia, DVT/PE on Xarelto, and depression who presents after being noted to have 3 separate episodes seizure activity upon EMS arrival.   Patient received 5 mg of IM Versed by EMS.  On arrival stable vitals, afebrile, labs with CO2 of 23, lactic acid 2 and anion gap of 16.Chest x-ray noted low lung volumes without acute abnormality. CT scan of the head noted no evidence of any acute abnormality.  Neurology was consulted and she was loaded with Keppra.  4/13: Per neurology note patient with no prior history of seizures.She was diagnosed with MS in 1977 after experiencing headaches. Previous neurology notes in 2016 note chronic left-sided weakness requiring dependence of wheelchair since 2014, urinary incontinence since same year. No other neurology follow-up notes are seen in chart.  EEG with moderate diffuse slowing, no definitive epileptiform discharges were noted.  Preliminary blood cultures negative in 24 hours. Palliative care was consulted to discuss goals of care.  4/14: Remained hemodynamically stable.  Mentation at baseline and no new concern.  Patient is a long-term resident at a skilled nursing facility.  Neurology signed off stating that she needs to continue Keppra 500 mg twice daily based on her risk.  Patient is being discharged with Keppra and she need to follow-up with her neurologist as outpatient.  Patient will continue with rest of her home medications and follow-up with her providers for further assistance.  Outpatient palliative care referral was also given.  Patient is high risk for mortality based on life limiting underlying comorbidities.  She is currently listed as full code.  Need goals of care discussion and POA which  can be worked out as outpatient.

## 2023-08-28 NOTE — Evaluation (Signed)
 Clinical/Bedside Swallow Evaluation Patient Details  Name: [name] MRN: [id number] Date of Birth: 1955/07/10  Today's Date: 08/28/2023 Time: SLP Start Time (ACUTE ONLY): 1308 SLP Stop Time (ACUTE ONLY): 1312 SLP Time Calculation (min) (ACUTE ONLY): 4 min  Past Medical History:  Past Medical History:  Diagnosis Date   Allergic rhinitis    Blood dyscrasia    Dementia (HCC)    Depression    Diabetes mellitus    Dysmetabolic syndrome X    Family history of anesthesia complication    brother "   Hypertension    Hypokalemia    Multiple sclerosis (HCC)    Neuropathic pain    Urinary incontinence    Vitamin D deficiency    Past Surgical History:  Past Surgical History:  Procedure Laterality Date   ABDOMINAL HYSTERECTOMY     CESAREAN SECTION     HPI:  [name] is a 68 y.o. female  who presented after being noted to have 3 separate episodes seizure activity. CXR 4/12 with no acute findings.  Pt with medical history significant ofadvanced MS with severe functional impairment/bedbound at baseline/dependent for all ADLs/nursing home resident, bedbound, dysphagia, type 2 diabetes, hypertension, hyperlipidemia, DVT/PE on Xarelto, and depression    Assessment / Plan / Recommendation  Clinical Impression  Pt presents with functional swallowing with baseline diet consistencies.  Pt tolerated puree by spoon and thin liquid by straw without any clinical s/s of aspiration. She denies new changes to swallowing. She had just finished midday meal on SLP arrival.  CXR  without acute findings.  Pt's presentation appears consistent with baseline level of function. SLP will sign off.    Recommend continuing puree diet with thin liquids.   SLP Visit Diagnosis: Dysphagia, oral phase (R13.11)    Aspiration Risk    Mild risk   Diet Recommendation Dysphagia 1 (Puree);Thin liquid    Liquid Administration via: Straw Medication Administration:  (As tolerated, no specific  precautions) Supervision: Staff to assist with self feeding Compensations: Slow rate;Small sips/bites Postural Changes: Seated upright at 90 degrees    Other  Recommendations Oral Care Recommendations: Oral care BID    Recommendations for follow up therapy are one component of a multi-disciplinary discharge planning process, led by the attending physician.  Recommendations may be updated based on patient status, additional functional criteria and insurance authorization.  Follow up Recommendations No SLP follow up      Assistance Recommended at Discharge  N/A  Functional Status Assessment Patient has not had a recent decline in their functional status  Frequency and Duration  (N/A)          Prognosis Prognosis for improved oropharyngeal function:  (N/A)      Swallow Study   General Date of Onset: 08/27/23 HPI: [name] is a 68 y.o. female  who presented after being noted to have 3 separate episodes seizure activity. CXR 4/12 with no acute findings.  Pt with medical history significant ofadvanced MS with severe functional impairment/bedbound at baseline/dependent for all ADLs/nursing home resident, bedbound, dysphagia, type 2 diabetes, hypertension, hyperlipidemia, DVT/PE on Xarelto, and depression    Oral/Motor/Sensory Function Overall Oral Motor/Sensory Function:  (Unable to assess)   Ice Chips Ice chips: Not tested   Thin Liquid Thin Liquid: Within functional limits Presentation: Straw    Nectar Thick Nectar Thick Liquid: Not tested   Honey Thick Honey Thick Liquid: Not tested   Puree Puree: Within functional limits Presentation: Spoon   Solid  Solid: Not tested      Elester Grim, MA, CCC-SLP Acute Rehabilitation Services Office: [phone] 08/28/2023,1:19 PM

## 2023-08-28 NOTE — Assessment & Plan Note (Signed)
 Patient is bedbound and completely dependent for all ADLs.

## 2023-08-28 NOTE — Assessment & Plan Note (Signed)
 No prior history of seizures.  EEG with generalized slowing and no epileptiform discharges.  CT head was negative for any acute abnormality, did show advanced chronic small vessel disease. Neurology is on board -Continue with Keppra

## 2023-08-28 NOTE — Assessment & Plan Note (Signed)
 Blood pressure currently within goal -Continue home clonidine

## 2023-08-28 NOTE — Assessment & Plan Note (Signed)
 Patient previously had been on a dysphagia 1 diet. - Dysphagia 1 diet with assistance - Speech therapy consulted

## 2023-08-28 NOTE — Assessment & Plan Note (Signed)
 At baseline patient sometimes oriented to self.

## 2023-08-28 NOTE — Assessment & Plan Note (Signed)
 Continue Xarelto

## 2023-08-28 NOTE — Assessment & Plan Note (Signed)
 Likely secondary to seizure-like activity. - Continue to monitor

## 2023-08-28 NOTE — Assessment & Plan Note (Signed)
 Uses metformin at home - SSI

## 2023-08-28 NOTE — Progress Notes (Signed)
 NEUROLOGY CONSULT FOLLOW UP NOTE   Date of service: August 28, 2023 Patient Name: [name] MRN:  [id number] DOB:  07/11/1955  Interval Hx/subjective   On exam this morning, patients neurological status is much improved. She is awake and responsive, interactive in conversation with decreased attention and drowsiness on continued questioning. No acute neurological events overnight.    Vitals   Vitals:   08/27/23 1947 08/27/23 2346 08/28/23 0319 08/28/23 0800  BP: (!) 154/95 (!) 157/97 (!) 164/98 (!) 162/115  Pulse: 78 68 72 74  Resp:      Temp: 97.6 F (36.4 C) 97.6 F (36.4 C) 97.7 F (36.5 C) 98 F (36.7 C)  TempSrc: Oral Oral Oral Axillary  SpO2: 100% 99% 100% 100%     There is no height or weight on file to calculate BMI.  Physical Exam   Constitutional: Appears chronically ill Cardiovascular: Normal rate and regular rhythm.  Respiratory: Effort normal, non-labored breathing.  GI: Soft.  No distension. There is no tenderness.  Skin: WDI.   Neurologic Examination   She is drowsy, but alert and responsive, interactive in conversations. She knows she is in the hospital, but does not know why, does not remember coming in.  She is oriented to self, not to age or month or year.  Says she feels back to normal but a little tired. States that she does not get out of bed at the facility. Dysconjugate gaze with right eye more lateral, reduced tracking in right eye.  All extremities are contracted with more movement of the RUE than LUE, with wiggling of toes/slight movement to BLE on command.   Medications  Current Facility-Administered Medications:    0.9 %  sodium chloride infusion, , Intravenous, Continuous, Manny Sees A, MD, Last Rate: 75 mL/hr at 08/27/23 1735, New Bag at 08/27/23 1735   acetaminophen (TYLENOL) tablet 650 mg, 650 mg, Oral, Q6H PRN, 650 mg at 08/27/23 2113 **OR** acetaminophen (TYLENOL) suppository 650 mg, 650 mg, Rectal, Q6H PRN, Felipe Horton, Rondell  A, MD   albuterol (PROVENTIL) (2.5 MG/3ML) 0.083% nebulizer solution 2.5 mg, 2.5 mg, Nebulization, Q6H PRN, Felipe Horton, Rondell A, MD   atorvastatin (LIPITOR) tablet 5 mg, 5 mg, Oral, QPM, Smith, Rondell A, MD, 5 mg at 08/27/23 2113   cloNIDine (CATAPRES) tablet 0.1 mg, 0.1 mg, Oral, Daily PRN, Felipe Horton, Rondell A, MD   feeding supplement (BOOST / RESOURCE BREEZE) liquid 1 Container, 1 Container, Oral, TID BM, Lena Qualia, MD, 1 Container at 08/28/23 0821   gabapentin (NEURONTIN) capsule 400 mg, 400 mg, Oral, TID, Felipe Horton, Rondell A, MD, 400 mg at 08/28/23 0814   latanoprost (XALATAN) 0.005 % ophthalmic solution 1 drop, 1 drop, Both Eyes, QHS, Smith, Rondell A, MD   levETIRAcetam (KEPPRA) IVPB 500 mg/100 mL premix, 500 mg, Intravenous, Q12H, Augustin Leber, MD, Last Rate: 400 mL/hr at 08/28/23 0815, 500 mg at 08/28/23 0815   LORazepam (ATIVAN) injection 1-2 mg, 1-2 mg, Intravenous, Q2H PRN, Felipe Horton, Rondell A, MD   metoprolol tartrate (LOPRESSOR) tablet 100 mg, 100 mg, Oral, BID, Smith, Rondell A, MD, 100 mg at 08/28/23 0814   ondansetron (ZOFRAN) tablet 4 mg, 4 mg, Oral, Q6H PRN **OR** ondansetron (ZOFRAN) injection 4 mg, 4 mg, Intravenous, Q6H PRN, Smith, Rondell A, MD   polyvinyl alcohol (LIQUIFILM TEARS) 1.4 % ophthalmic solution 1 drop, 1 drop, Both Eyes, BID, Smith, Rondell A, MD, 1 drop at 08/28/23 1478   rivaroxaban (XARELTO) tablet 20 mg, 20 mg, Oral, QPM, Smith,  Rondell A, MD   sodium chloride flush (NS) 0.9 % injection 3 mL, 3 mL, Intravenous, Q12H, Smith, Rondell A, MD, 3 mL at 08/28/23 0821  Labs and Diagnostic Imaging   CBC:  Recent Labs  Lab 08/27/23 1230 08/27/23 1240  WBC 7.1  --   NEUTROABS 5.7  --   HGB 12.1 12.6  HCT 37.0 37.0  MCV 83.7  --   PLT 234  --     Basic Metabolic Panel:  Lab Results  Component Value Date   NA 138 08/27/2023   K 4.1 08/27/2023   CO2 23 08/27/2023   GLUCOSE 119 (H) 08/27/2023   BUN 13 08/27/2023   CREATININE 0.69 08/27/2023    CALCIUM 9.4 08/27/2023   GFRNONAA >60 08/27/2023   GFRAA >60 07/08/2019   Lipid Panel: No results found for: "LDLCALC" HgbA1c:  Lab Results  Component Value Date   HGBA1C 6.1 (H) 12/29/2011   Urine Drug Screen:     Component Value Date/Time   LABOPIA NONE DETECTED 09/11/2021 0003   COCAINSCRNUR NONE DETECTED 09/11/2021 0003   LABBENZ NONE DETECTED 09/11/2021 0003   AMPHETMU NONE DETECTED 09/11/2021 0003   THCU NONE DETECTED 09/11/2021 0003   LABBARB NONE DETECTED 09/11/2021 0003    Alcohol Level No results found for: "ETH" INR  Lab Results  Component Value Date   INR 1.0 08/27/2023   APTT  Lab Results  Component Value Date   APTT 101 (H) 11/24/2017   AED levels: No results found for: "PHENYTOIN", "ZONISAMIDE", "LAMOTRIGINE", "LEVETIRACETA"  CT Head without contrast(Personally reviewed): No evidence of acute intracranial abnormality. Advanced chronic small vessel ischemic disease.  rEEG:  This EEG was obtained while asleep and is abnormal due to moderate diffuse slowing indicative of global cerebral dysfunction, medication effect, or both. Epileptiform abnormalities were not seen during this recording.   Assessment   [name] is a 68 y.o. female with hx of MS, CVA, dementia, HTN, neuropathic pain, vitamin D deficiency who who was brought in by EMS from her skilled nursing facility after  3 separate witnessed convulsive episodes.  Patient had seizure activity on EMS arrival, given 5 mg of Versed.  She was obtunded and unable to provide history on ED admission.    On neurology exam today, patient is much improved and is able to participate in conversation and moves extremities to command (within baseline limitations). States she feels back to her baseline today.    No seizure history. Diagnosed with MS in 1977. Patient states that she does not get out of bed at her facility. Denies pain, SOB, headache.   Reasonable to continue Keppra given her multiple witnessed  seizures.   Recommendations   - Continue Keppra 500mg  BID  Neurology will sign off. Please call with nay further questions/concerns.  ______________________________________________________________________   Pt seen by Neuro NP/APP and later by MD. Note/plan to be edited by MD as needed.    Signed, Audrene Lease, NP [facility]   I have seen the patient and reviewed the above note, still remains unclear to me exactly how aggressive care is supposed to be given that she was marked as comfort care, agree with palliative care consultation.  With her improvement, neurology will sign off, please call if further questions or concerns.  Ann Keto, MD [facility]   If 7pm- 7am, please page neurology on call as listed in AMION.

## 2023-08-28 NOTE — Assessment & Plan Note (Signed)
 Continue statin.

## 2023-08-28 NOTE — Progress Notes (Signed)
 Progress Note   Patient: [name] AVW:[id number] DOB: 06/11/1955 DOA: 08/27/2023     0 DOS: the patient was seen and examined on 08/28/2023   Brief hospital course: Taken from H&P.   [name] is a 68 y.o. female with medical history significant ofadvanced MS with severe functional impairment/bedbound at baseline/dependent for all ADLs/nursing home resident, bedbound, dysphagia, type 2 diabetes, hypertension, hyperlipidemia, DVT/PE on Xarelto, and depression who presents after being noted to have 3 separate episodes seizure activity upon EMS arrival.   Patient received 5 mg of IM Versed by EMS.  On arrival stable vitals, afebrile, labs with CO2 of 23, lactic acid 2 and anion gap of 16.Chest x-ray noted low lung volumes without acute abnormality. CT scan of the head noted no evidence of any acute abnormality.  Neurology was consulted and she was loaded with Keppra.  4/13: Per neurology note patient with no prior history of seizures.She was diagnosed with MS in 1977 after experiencing headaches. Previous neurology notes in 2016 note chronic left-sided weakness requiring dependence of wheelchair since 2014, urinary incontinence since same year. No other neurology follow-up notes are seen in chart.  EEG with moderate diffuse slowing, no definitive epileptiform discharges were noted.  Preliminary blood cultures negative in 24 hours. Palliative care was consulted to discuss goals of care.  Assessment and Plan: * Seizure (HCC) No prior history of seizures.  EEG with generalized slowing and no epileptiform discharges.  CT head was negative for any acute abnormality, did show advanced chronic small vessel disease. Neurology is on board -Continue with Keppra  Lactic acidosis Likely secondary to seizure-like activity. - Continue to monitor  Dysphagia Patient previously had been on a dysphagia 1 diet. - Dysphagia 1 diet with assistance - Speech therapy consulted  Secondary progressive  multiple sclerosis (HCC) Patient is bedbound and completely dependent for all ADLs.  Dementia without behavioral disturbance (HCC) At baseline patient sometimes oriented to self.  DM2 (diabetes mellitus, type 2) (HCC) Uses metformin at home - SSI  Essential hypertension Blood pressure currently within goal -Continue home clonidine  HLD (hyperlipidemia) - Continue statin  History of DVT (deep vein thrombosis) - Continue Xarelto      Subjective: Patient was being fed with the help of nursing staff when seen today.  Hardly talking any words.  Physical Exam: Vitals:   08/27/23 1947 08/27/23 2346 08/28/23 0319 08/28/23 0800  BP: (!) 154/95 (!) 157/97 (!) 164/98 (!) 162/115  Pulse: 78 68 72 74  Resp:      Temp: 97.6 F (36.4 C) 97.6 F (36.4 C) 97.7 F (36.5 C) 98 F (36.7 C)  TempSrc: Oral Oral Oral Axillary  SpO2: 100% 99% 100% 100%   General.  Frail elderly lady in no acute distress. Pulmonary.  Lungs clear bilaterally, normal respiratory effort. CV.  Regular rate and rhythm, no JVD, rub or murmur. Abdomen.  Soft, nontender, nondistended, BS positive. CNS.  Alert and oriented to self.  Multiple contractures, no neurodeficit Extremities.  No edema, no cyanosis, pulses intact and symmetrical.  Data Reviewed: Prior data reviewed  Family Communication:   Disposition: Status is: Observation The patient will require care spanning > 2 midnights and should be moved to inpatient because: Severity of illness  Planned Discharge Destination: Skilled nursing facility  DVT prophylaxis.  Xarelto Time spent: 45 minutes  This record has been created using Conservation officer, historic buildings. Errors have been sought and corrected,but may not always be located. Such creation errors do not  reflect on the standard of care.   Author: Luna Salinas, MD 08/28/2023 7:03 PM  For on call review [email or web address].

## 2023-08-28 NOTE — Progress Notes (Signed)
 Pt is asleep. Unable to review observation notice with pt. Contacted pt's sister, Merry Abo, left VM to return my call.

## 2023-08-29 DIAGNOSIS — Z7189 Other specified counseling: Secondary | ICD-10-CM | POA: Diagnosis not present

## 2023-08-29 DIAGNOSIS — G35 Multiple sclerosis: Secondary | ICD-10-CM | POA: Diagnosis not present

## 2023-08-29 DIAGNOSIS — F0390 Unspecified dementia without behavioral disturbance: Secondary | ICD-10-CM | POA: Diagnosis not present

## 2023-08-29 DIAGNOSIS — Z515 Encounter for palliative care: Secondary | ICD-10-CM | POA: Diagnosis not present

## 2023-08-29 DIAGNOSIS — R4182 Altered mental status, unspecified: Secondary | ICD-10-CM | POA: Diagnosis not present

## 2023-08-29 DIAGNOSIS — R569 Unspecified convulsions: Secondary | ICD-10-CM | POA: Diagnosis not present

## 2023-08-29 LAB — CBC
HCT: 31.2 % — ABNORMAL LOW (ref 36.0–46.0)
Hemoglobin: 10.2 g/dL — ABNORMAL LOW (ref 12.0–15.0)
MCH: 27.1 pg (ref 26.0–34.0)
MCHC: 32.7 g/dL (ref 30.0–36.0)
MCV: 82.8 fL (ref 80.0–100.0)
Platelets: 193 10*3/uL (ref 150–400)
RBC: 3.77 MIL/uL — ABNORMAL LOW (ref 3.87–5.11)
RDW: 13.7 % (ref 11.5–15.5)
WBC: 5 10*3/uL (ref 4.0–10.5)
nRBC: 0 % (ref 0.0–0.2)

## 2023-08-29 LAB — BASIC METABOLIC PANEL WITH GFR
Anion gap: 9 (ref 5–15)
BUN: 16 mg/dL (ref 8–23)
CO2: 22 mmol/L (ref 22–32)
Calcium: 8.7 mg/dL — ABNORMAL LOW (ref 8.9–10.3)
Chloride: 109 mmol/L (ref 98–111)
Creatinine, Ser: 0.61 mg/dL (ref 0.44–1.00)
GFR, Estimated: >60 mL/min (ref >60)
Glucose, Bld: 121 mg/dL — ABNORMAL HIGH (ref 70–99)
Potassium: 3.8 mmol/L (ref 3.5–5.1)
Sodium: 140 mmol/L (ref 135–145)

## 2023-08-29 LAB — LACTIC ACID, PLASMA: Lactic Acid, Venous: 1.1 mmol/L (ref 0.5–1.9)

## 2023-08-29 MED ORDER — LEVETIRACETAM 500 MG PO TABS: 500.0000 mg | ORAL_TABLET | Freq: Two times a day (BID) | ORAL | Status: AC

## 2023-08-29 NOTE — Care Management Obs Status (Signed)
 MEDICARE OBSERVATION STATUS NOTIFICATION   Patient Details  Name: RENEKA NEBERGALL MRN: [id number] Date of Birth: 02-05-1956   Medicare Observation Status Notification Given:  Yes   CSW attempted to reach patient's sister, Merry Abo, 3 times throughout the day to no success. Left a voicemail asking for return call. Awaiting call back for sister's address to mail copy of letter.   Larena Plaza Christoval, LCSW 08/29/2023, 2:05 PM

## 2023-08-29 NOTE — Consult Note (Signed)
 Palliative Medicine Inpatient Consult Note  Consulting Provider: Arnetha Courser, MD   Reason for consult:   Palliative Care Consult Services Palliative Medicine Consult  Reason for Consult? To discuss goals of care   08/29/2023  HPI:  Per intake H&P --> [name] is a 68 y.o. female with medical history significant ofadvanced MS with severe functional impairment/bedbound at baseline/dependent for all ADLs/nursing home resident, bedbound, dysphagia, type 2 diabetes, hypertension, hyperlipidemia, DVT/PE on Xarelto, and depression who presents after being noted to have 3 separate episodes seizure activity. Has been stable on keppra since admission. The PMT has been asked to further address goals of care.   Clinical Assessment/Goals of Care:  *Please note that this is a verbal dictation therefore any spelling or grammatical errors are due to the "Dragon [employer] One" system interpretation.  I have reviewed medical records including EPIC notes, labs and imaging, received report from bedside RN, assessed the patient who is lying in the bed pleasantly disoriented.    I called patients sister, Huntley Dec to further discuss diagnosis prognosis, GOC, EOL wishes, disposition and options.   I introduced Palliative Medicine as specialized medical care for people living with serious illness. It focuses on providing relief from the symptoms and stress of a serious illness. The goal is to improve quality of life for both the patient and the family.  Medical History Review and Understanding:  A review of [name]'s past history significant for dementia, type 2 diabetes, hypertension, neuropathy, and multiple sclerosis was held.  Social History:  [name] is from [facility].  She has been married twice.  She has 1 son, Pincus Sanes is 49.  She has 2 grandchildren.  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  She is a woman of strong faith practicing within the [employer] [facility] [employer]  denomination.  Azarie prior to illness was identified as an individual who gets the most out of life and loves being social.  Functional and Nutritional State:  [name] is fully dependent on staff at [facility] for all BADLs and IADLs.  She is still able to eat and drink when fed and typically consumes roughly 100% of her meals.  Advance Directives:  A detailed discussion was had today regarding advanced directives.  Patient's two sisters and son make decisions on her behalf.  Code Status:  Concepts specific to code status, artifical feeding and hydration, continued IV antibiotics and rehospitalization was had.  The difference between a aggressive medical intervention path  and a palliative comfort care path for this patient at this time was had.   Encouraged patient/family to consider DNR/DNI status understanding evidenced based poor outcomes in similar hospitalized patient, as the cause of arrest is likely associated with advanced chronic/terminal illness rather than an easily reversible acute cardio-pulmonary event. I explained that DNR/DNI does not change the medical plan and it only comes into effect after a person has arrested (died).  It is a protective measure to keep [place] from harming the patient in their last moments of life.   Huntley Dec agrees that is a family they need to talk more about if [name] were to be in a situation whereby resuscitation was needed what they would and would not desire.  Discussion:  We reviewed the past few years of [name]'s life and how per Huntley Dec, she has declined.  She is fully dependent and from the perspective of quality of life her sister feels like she has little to none.  We discussed the progressive nature  of MS and what end-stage disease looks like. We reviewed her contractures are to be expected moving forward with he disease. We discussed in addition the chronic conditions that [name] is afflicted by. We reviewed the chronic disease trajectory and how patients  experience a precipitous decline over time.   A review of the difference between palliative care and hospice was held:  Palliative care is specialized medical care for people living with a serious illness, such as cancer, heart failure, COPD, Alzheimer's dementia, etc. Patients in palliative care may receive medical care for their symptoms, or palliative care, along with treatment intended to cure their serious illness   Hospice care focuses on the care, comfort, and quality of life of a person with a serious illness who is approaching the end of life. At some point, it may not be possible to cure a serious illness, or a patient may choose not to undergo certain treatments. Hospice is designed for this situation.  Patients sister, Huntley Dec plans to speak to her sister [name] and nephew, [name] to determine best steps. She is in agreement with [facility] Palliative support communicating with them for a GOC meeting once she is back at [facility].   I have informed [facility] liaison, Shawn of the above.   Discussed the importance of continued conversation with family and their  medical providers regarding overall plan of care and treatment options, ensuring decisions are within the context of the patients values and GOCs.  Decision Maker: [name] (Sister): [phone] [name] (Sister): [phone]  SUMMARY OF RECOMMENDATIONS   Full Code presently - I asked patients family to continue to discuss this  Discussed patients acute on chronic disease burden and poor long term outlook  Discussed the differences between Palliative and Hospice care  OP Palliative Care though [facility]  PMT will continue to provide support as needed  Code Status/Advance Care Planning: FULL CODE  Palliative Prophylaxis:  Aspiration, Bowel Regimen, Delirium Protocol, Frequent Pain Assessment, Oral Care, Palliative Wound Care, and Turn Reposition  Additional Recommendations (Limitations, Scope,  Preferences): Continue present care  Psycho-social/Spiritual:  Desire for further Chaplaincy support: Yes - [facility] Additional Recommendations: Education on chronic disease and MS progression   Prognosis: Limited overall.   Discharge Planning: Discharge back to [facility] where Demetrius is a long term resident.   Vitals:   08/29/23 0806 08/29/23 1145  BP: 113/72 104/72  Pulse: 91 78  Resp:    Temp: 98.2 F (36.8 C) 98.2 F (36.8 C)  SpO2: 100% 99%    Intake/Output Summary (Last 24 hours) at 08/29/2023 1349 Last data filed at 08/29/2023 1321 Gross per 24 hour  Intake 277 ml  Output 550 ml  Net -273 ml   Gen:  Elderly chronically ill appearing AA F HEENT: Dry mucous membranes CV: Regular rate and rhythm  PULM:  On RA, breathing is even and nonlabored ABD: soft/nontender  EXT: (+) contractures Neuro: Alert and oriented x1  PPS:30%   This conversation/these recommendations were discussed with patient primary care team, Dr. Nelson Chimes  Billing based on MDM: High ______________________________________________________ Lamarr Lulas [facility] Palliative Medicine Team Team Cell Phone: [phone] Please utilize secure chat with additional questions, if there is no response within 30 minutes please call the above phone number  Palliative Medicine Team providers are available by phone from 7am to 7pm daily and can be reached through the team cell phone.  Should this patient require assistance outside of these hours, please call the patient's attending physician.

## 2023-08-29 NOTE — Plan of Care (Signed)
   Palliative Medicine Inpatient Follow Up Note   The PMT has acknowledged the consultation for [name].   Three attempts have been made to reach patients sister, Ludwig Safer without response.  [facility] had followed in the past. [facility] will follow up on discharge for ongoing GOC conversations.   No Charge ______________________________________________________________________________________ [name] [place] Palliative Medicine Team Team Cell Phone: [phone] Please utilize secure chat with additional questions, if there is no response within 30 minutes please call the above phone number  Palliative Medicine Team providers are available by phone from 7am to 7pm daily and can be reached through the team cell phone.  Should this patient require assistance outside of these hours, please call the patient's attending physician.

## 2023-08-29 NOTE — [place] FL2 (Signed)
   MEDICAID FL2 LEVEL OF CARE FORM     IDENTIFICATION  Patient Name: [name] Birthdate: 11/04/1955 Sex: female Admission Date (Current Location): 08/27/2023  [facility] and [place] Number:  Producer, television/film/video and Address:  [facility], [address], [place], [place] [address]      Provider Number: [id number]  Attending Physician Name and Address:  Luna Salinas, MD  Relative Name and Phone Number:       Current Level of Care: Hospital Recommended Level of Care: Skilled Nursing Facility Prior Approval Number:    Date Approved/Denied:   PASRR Number:    Discharge Plan: SNF    Current Diagnoses: Patient Active Problem List   Diagnosis Date Noted   Seizure (HCC) 08/27/2023   Lactic acidosis 08/27/2023   Dysphagia 08/27/2023   Dementia without behavioral disturbance (HCC) 08/27/2023   Essential hypertension 08/27/2023   History of DVT (deep vein thrombosis) 08/27/2023   COVID-19 virus infection 01/03/2023   Elevated troponin 01/03/2023   HLD (hyperlipidemia) 01/03/2023   Leukocytosis 11/17/2017   Severe sepsis (HCC) 11/17/2017   DVT (deep venous thrombosis) Rt Leg 11/14/2017   Pulmonary embolism (HCC) 11/13/2017   Pulmonary emboli (HCC) 11/13/2017   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   Cerebrovascular disease 01/17/2015   Acute metabolic encephalopathy 12/29/2011   Pyelonephritis, acute 12/29/2011   Hypokalemia 12/29/2011   Depression    MS (multiple sclerosis) (HCC) 12/28/2011   Altered mental status 12/28/2011   Fever 12/28/2011   DM2 (diabetes mellitus, type 2) (HCC) 12/28/2011    Orientation RESPIRATION BLADDER Height & Weight      (disoriented)  Normal Incontinent Weight:   Height:     BEHAVIORAL SYMPTOMS/MOOD NEUROLOGICAL BOWEL NUTRITION STATUS      Incontinent Diet (see DC summary)  AMBULATORY STATUS COMMUNICATION OF NEEDS Skin   Total Care Non-Verbally PU Stage and Appropriate Care PU  Stage 1 Dressing:  (healed pressure injury, sacrum: foam dressing, lift every shift to assess and change PRN)                     Personal Care Assistance Level of Assistance  Bathing, Feeding, Dressing Bathing Assistance: Maximum assistance Feeding assistance: Maximum assistance Dressing Assistance: Maximum assistance     Functional Limitations Info  Speech     Speech Info: Impaired (mute)    SPECIAL CARE FACTORS FREQUENCY                       Contractures Contractures Info: Not present    Additional Factors Info  Code Status, Allergies Code Status Info: Full Allergies Info: NKA           Current Medications (08/29/2023):  This is the current hospital active medication list Current Facility-Administered Medications  Medication Dose Route Frequency Provider Last Rate Last Admin   acetaminophen (TYLENOL) tablet 650 mg  650 mg Oral Q6H PRN Smith, Rondell A, MD   650 mg at 08/27/23 2113   Or   acetaminophen (TYLENOL) suppository 650 mg  650 mg Rectal Q6H PRN Smith, Rondell A, MD       albuterol (PROVENTIL) (2.5 MG/3ML) 0.083% nebulizer solution 2.5 mg  2.5 mg Nebulization Q6H PRN Felipe Horton, Rondell A, MD       atorvastatin (LIPITOR) tablet 5 mg  5 mg Oral QPM Smith, Rondell A, MD   5 mg at 08/28/23 1705   cloNIDine (CATAPRES) tablet 0.1 mg  0.1 mg Oral  Daily PRN Lena Qualia, MD       feeding supplement (BOOST / RESOURCE BREEZE) liquid 1 Container  1 Container Oral TID BM Manny Sees A, MD   1 Container at 08/29/23 0903   gabapentin (NEURONTIN) capsule 400 mg  400 mg Oral TID Smith, Rondell A, MD   400 mg at 08/29/23 0902   latanoprost (XALATAN) 0.005 % ophthalmic solution 1 drop  1 drop Both Eyes QHS Smith, Rondell A, MD   1 drop at 08/28/23 2300   levETIRAcetam (KEPPRA) IVPB 500 mg/100 mL premix  500 mg Intravenous Q12H Augustin Leber, MD 400 mL/hr at 08/29/23 0841 500 mg at 08/29/23 0841   LORazepam (ATIVAN) injection 1-2 mg  1-2 mg Intravenous Q2H  PRN Smith, Rondell A, MD       metoprolol tartrate (LOPRESSOR) tablet 100 mg  100 mg Oral BID Smith, Rondell A, MD   100 mg at 08/29/23 0902   ondansetron (ZOFRAN) tablet 4 mg  4 mg Oral Q6H PRN Smith, Rondell A, MD       Or   ondansetron (ZOFRAN) injection 4 mg  4 mg Intravenous Q6H PRN Smith, Rondell A, MD       polyvinyl alcohol (LIQUIFILM TEARS) 1.4 % ophthalmic solution 1 drop  1 drop Both Eyes BID Manny Sees A, MD   1 drop at 08/29/23 3086   rivaroxaban (XARELTO) tablet 20 mg  20 mg Oral QPM Smith, Rondell A, MD   20 mg at 08/28/23 1705   sodium chloride flush (NS) 0.9 % injection 3 mL  3 mL Intravenous Q12H Manny Sees A, MD   3 mL at 08/29/23 5784     Discharge Medications: Please see discharge summary for a list of discharge medications.  Relevant Imaging Results:  Relevant Lab Results:   Additional Information SS#: [id number]  Tandy Fam, LCSW

## 2023-08-29 NOTE — TOC Transition Note (Signed)
 Transition of Care [facility]) - Discharge Note   Patient Details  Name: [name] MRN: [id number] Date of Birth: 1955/08/28  [facility] of [facility]) CM/SW Contact:  Tandy Fam, LCSW Phone Number: 08/29/2023, 2:09 PM   Clinical Narrative:   Patient is a LTC resident at [facility]. CSW attempted to reach sister, Moira Andrews, to notify of discharge, left a voicemail. Multiple calls made to [place] throughout the day. CSW also attempted numbers in the chart for another sister Abraham Hoffmann and a niece Adacia, but none of the other numbers worked. Transport arranged with [facility] for next available.  Nurse to call report to [phone], Room 301A.    Final next level of care: Skilled Nursing Facility Barriers to Discharge: Barriers Resolved   Patient Goals and CMS Choice Patient states their goals for this hospitalization and ongoing recovery are:: patient unable to participate in goal setting, not fully oriented CMS Medicare.gov Compare Post Acute Care list provided to:: Patient Represenative (must comment) Choice offered to / list presented to : Sibling [place] ownership interest in [facility].provided to:: Sibling    Discharge Placement              Patient chooses bed at:  [facility] Patient to be transferred to facility by: [facility] Name of family member notified: Moira Andrews (left a voicemail) Patient and family notified of of transfer: 08/29/23  Discharge Plan and Services Additional resources added to the After Visit Summary for                                       Social Drivers of Health (SDOH) Interventions SDOH Screenings   Food Insecurity: Patient Unable To Answer (01/03/2023)  Housing: Low Risk  (01/04/2023)  Recent Concern: Housing - High Risk (01/03/2023)  Transportation Needs: Unknown (01/03/2023)  Utilities: Not At Risk (01/04/2023)  Tobacco Use: Low Risk  (01/02/2023)     Readmission Risk Interventions     No data to display

## 2023-08-29 NOTE — Discharge Summary (Signed)
 Physician Discharge Summary   Patient: [name] MRN: [id number] DOB: [birth year]  Admit date:     08/27/2023  Discharge date: 08/29/23  Discharge Physician: Arnetha Courser   PCP: Florentina Jenny, MD   Recommendations at discharge:  Please obtain CBC and BMP and follow-up Follow-up with neurology Follow-up with primary care provider Outpatient palliative care referral was given-patient need goals of care discussion and POA  Discharge Diagnoses: Principal Problem:   Seizure [facility]) Active Problems:   Lactic acidosis   Dysphagia   Secondary progressive multiple sclerosis (HCC)   Dementia without behavioral disturbance (HCC)   DM2 (diabetes mellitus, type 2) (HCC)   Essential hypertension   HLD (hyperlipidemia)   History of DVT (deep vein thrombosis)   Altered mental status   Hospital Course: Taken from H&P.   AREONNA BRAN is a 68 y.o. female with medical history significant ofadvanced MS with severe functional impairment/bedbound at baseline/dependent for all ADLs/nursing home resident, bedbound, dysphagia, type 2 diabetes, hypertension, hyperlipidemia, DVT/PE on Xarelto, and depression who presents after being noted to have 3 separate episodes seizure activity upon EMS arrival.   Patient received 5 mg of IM Versed by EMS.  On arrival stable vitals, afebrile, labs with CO2 of 23, lactic acid 2 and anion gap of 16.Chest x-ray noted low lung volumes without acute abnormality. CT scan of the head noted no evidence of any acute abnormality.  Neurology was consulted and she was loaded with Keppra.  4/13: Per neurology note patient with no prior history of seizures.She was diagnosed with MS in 1977 after experiencing headaches. Previous neurology notes in 2016 note chronic left-sided weakness requiring dependence of wheelchair since 2014, urinary incontinence since same year. No other neurology follow-up notes are seen in chart.  EEG with moderate diffuse slowing, no definitive  epileptiform discharges were noted.  Preliminary blood cultures negative in 24 hours. Palliative care was consulted to discuss goals of care.  4/14: Remained hemodynamically stable.  Mentation at baseline and no new concern.  Patient is a long-term resident at a skilled nursing facility.  Neurology signed off stating that she needs to continue Keppra 500 mg twice daily based on her risk.  Patient is being discharged with Keppra and she need to follow-up with her neurologist as outpatient.  Patient will continue with rest of her home medications and follow-up with her providers for further assistance.  Outpatient palliative care referral was also given.  Patient is high risk for mortality based on life limiting underlying comorbidities.  She is currently listed as full code.  Need goals of care discussion and POA which can be worked out as outpatient.  Assessment and Plan: * Seizure (HCC) No prior history of seizures.  EEG with generalized slowing and no epileptiform discharges.  CT head was negative for any acute abnormality, did show advanced chronic small vessel disease. Neurology is on board -Continue with Keppra  Lactic acidosis Likely secondary to seizure-like activity. - Resolved  Dysphagia Patient previously had been on a dysphagia 1 diet. - Dysphagia 1 diet with assistance - Speech therapy consulted  Secondary progressive multiple sclerosis (HCC) Patient is bedbound and completely dependent for all ADLs.  Dementia without behavioral disturbance (HCC) At baseline patient sometimes oriented to self.  DM2 (diabetes mellitus, type 2) (HCC) Uses metformin at home - SSI  Essential hypertension Blood pressure currently within goal -Continue home clonidine  HLD (hyperlipidemia) - Continue statin  History of DVT (deep vein thrombosis) - Continue Xarelto  Consultants: Neurology Procedures performed: EEG Disposition: Skilled nursing facility Diet recommendation:  Dysphagia 1 with thin liquid Discharge Diet Orders (From admission, onward)     Start     Ordered   08/29/23 0000  Diet - low sodium heart healthy        08/29/23 1021           This DISCHARGE MEDICATION: Allergies as of 08/29/2023   No Known Allergies      Medication List     TAKE these medications    acetaminophen 325 MG tablet Commonly known as: TYLENOL Take 650 mg by mouth in the morning, at noon, and at bedtime. DO NOT EXCEED 3,000 MG IN HOURS   ammonium lactate 12 % lotion Commonly known as: LAC-HYDRIN Apply 1 application  topically See admin instructions. Apply topically to bilateral feet on the 23rd day of each month, every 30 days.   atorvastatin 10 MG tablet Commonly known as: LIPITOR Take 5 mg by mouth every evening.   Boost Glucose Control Liqd Take 237 mLs by mouth with breakfast, with lunch, and with evening meal.   carboxymethylcellulose 1 % ophthalmic solution Place 1 drop into both eyes 2 (two) times daily.   cloNIDine 0.1 MG tablet Commonly known as: CATAPRES Take 0.1 mg by mouth See admin instructions. Give 1 tablet (0.1mg ) by mouth once daily as needed for SBP > 180 or DBP > 95.   Cranberry 450 MG Tabs Take 450 mg by mouth 2 (two) times daily.   ferrous sulfate 325 (65 FE) MG tablet Take 1 tablet (325 mg total) by mouth daily with breakfast. What changed: when to take this   gabapentin 400 MG capsule Commonly known as: NEURONTIN Take 400 mg by mouth 3 (three) times daily.   latanoprost 0.005 % ophthalmic solution Commonly known as: XALATAN Place 1 drop into both eyes at bedtime.   levETIRAcetam 500 MG tablet Commonly known as: KEPPRA Take 1 tablet (500 mg total) by mouth 2 (two) times daily.   metFORMIN 500 MG tablet Commonly known as: GLUCOPHAGE Take 500 mg by mouth daily.   metoprolol tartrate 100 MG tablet Commonly known as: LOPRESSOR Take 100 mg by mouth 2 (two) times daily.   polyethylene glycol 17 g packet Commonly  known as: MIRALAX / GLYCOLAX Take 17 g by mouth daily as needed for mild constipation.   rivaroxaban 20 MG Tabs tablet Commonly known as: XARELTO Take 1 tablet (20 mg total) by mouth daily with supper. What changed: when to take this   sennosides-docusate sodium 8.6-50 MG tablet Commonly known as: SENOKOT-S Take 2 tablets by mouth every evening.   Systane Balance 0.6 % Soln Generic drug: Propylene Glycol Place 1 drop into both eyes 2 (two) times daily.   Vitamin D3 10 MCG (400 UNIT) tablet Take 800 Units by mouth daily.               Discharge Care Instructions  (From admission, onward)           Start     Ordered   08/29/23 0000  Discharge wound care:       Comments: Pressure injury-please apply gel dressing and change position frequently   08/29/23 1021            Contact information for follow-up providers     Florentina Jenny, MD. Schedule an appointment as soon as possible for a visit in 1 week(s).   Specialty: Family Medicine Contact information: [address]  [place] [address] [phone]              Contact information for after-discharge care     Destination     [facility] Preferred SNF .   Service: Skilled Nursing Contact information: [address] [place]  [address] [phone]                    Discharge Exam: There were no vitals filed for this visit. General.  Frail lady, in no acute distress. Pulmonary.  Lungs clear bilaterally, normal respiratory effort. CV.  Regular rate and rhythm, no JVD, rub or murmur. Abdomen.  Soft, nontender, nondistended, BS positive. CNS.  Alert and oriented to self.  No new focal neurologic deficit. Extremities.  No edema, contractures involving all extremities  Condition at discharge: stable  The results of significant diagnostics from this hospitalization (including imaging, microbiology, ancillary and laboratory)  are listed below for reference.   Imaging Studies: EEG adult Result Date: 08/27/2023 Eleni Griffin, MD     08/27/2023  2:57 PM Routine EEG Report TATYANA BIBER is a 68 y.o. female with a history of seizure who is undergoing an EEG to evaluate for seizures. Report: This EEG was acquired with electrodes placed according to the International 10-20 electrode system (including Fp1, Fp2, F3, F4, C3, C4, P3, P4, O1, O2, T3, T4, T5, T6, A1, A2, Fz, Cz, Pz). The following electrodes were missing or displaced: none. The background showed moderate diffuse slowing at 5 Hz with overriding beta frequencies. This activity is reactive to stimulation. There was no clear waking rhythm. Sleep was identified by K complexes and sleep spindles. There was no focal slowing. There were no interictal epileptiform discharges. There were no electrographic seizures identified. Photic stimulation and hyperventilation were not performed. Impression and clinical correlation: This EEG was obtained while asleep and is abnormal due to moderate diffuse slowing indicative of global cerebral dysfunction, medication effect, or both. Epileptiform abnormalities were not seen during this recording. Duration of study 29 minutes Greg Leaks, MD [facility] [phone] If 7pm- 7am, please page neurology on call as listed in AMION.   DG Chest Port 1 View Result Date: 08/27/2023 CLINICAL DATA:  Questionable sepsis - evaluate for abnormality EXAM: PORTABLE CHEST 1 VIEW COMPARISON:  January 02, 2023 FINDINGS: Low lung volumes with elevation of the left hemidiaphragm. No focal airspace consolidation, pleural effusion, or pneumothorax. No cardiomegaly. Osteopenia. Multilevel degenerative disc disease of the spine. No acute fracture or destructive lesion. IMPRESSION: Low lung volumes.  Otherwise, no acute cardiopulmonary abnormality. Electronically Signed   By: Rance Burrows M.D.   On: 08/27/2023 13:00   CT Head Wo Contrast Result Date:  08/27/2023 CLINICAL DATA:  Mental status change, unknown cause.  Seizures. EXAM: CT HEAD WITHOUT CONTRAST TECHNIQUE: Contiguous axial images were obtained from the base of the skull through the vertex without intravenous contrast. RADIATION DOSE REDUCTION: This exam was performed according to the departmental dose-optimization program which includes automated exposure control, adjustment of the mA and/or kV according to patient size and/or use of iterative reconstruction technique. COMPARISON:  Head CT 01/02/2023 FINDINGS: The study is mildly motion degraded. Brain: There is no evidence of an acute infarct, intracranial hemorrhage, mass, midline shift, or extra-axial fluid collection. There is moderate cerebral atrophy. Confluent hypodensities in the cerebral white matter are similar to the prior CT and are nonspecific but compatible with advanced chronic small vessel ischemic disease. Vascular: Calcified atherosclerosis at the  skull base. No hyperdense vessel. Skull: No acute fracture or suspicious lesion. Sinuses/Orbits: Paranasal sinuses and mastoid air cells are clear. Unremarkable orbits. Other: None. IMPRESSION: 1. No evidence of acute intracranial abnormality. 2. Advanced chronic small vessel ischemic disease. Electronically Signed   By: Aundra Lee M.D.   On: 08/27/2023 12:41    Microbiology: Results for orders placed or performed during the hospital encounter of 08/27/23  Blood Culture (routine x 2)     Status: None (Preliminary result)   Collection Time: 08/27/23 12:30 PM   Specimen: BLOOD RIGHT FOREARM  Result Value Ref Range Status   Specimen Description BLOOD RIGHT FOREARM  Final   Special Requests   Final    BOTTLES DRAWN AEROBIC AND ANAEROBIC Blood Culture adequate volume   Culture   Final    NO GROWTH 2 DAYS Performed at [facility], [address], [place], [place] [address]    Report Status PENDING  Incomplete  Blood Culture (routine x 2)     Status: None (Preliminary  result)   Collection Time: 08/27/23  4:14 PM   Specimen: BLOOD  Result Value Ref Range Status   Specimen Description BLOOD SITE NOT SPECIFIED  Final   Special Requests   Final    BOTTLES DRAWN AEROBIC AND ANAEROBIC Blood Culture results may not be optimal due to an inadequate volume of blood received in culture bottles   Culture   Final    NO GROWTH 2 DAYS Performed at [facility], [address] [place], [place] [address]    Report Status PENDING  Incomplete    Labs: CBC: Recent Labs  Lab 08/27/23 1230 08/27/23 1240 08/28/23 1159 08/29/23 0609  WBC 7.1  --  4.8 5.0  NEUTROABS 5.7  --   --   --   HGB 12.1 12.6 11.7* 10.2*  HCT 37.0 37.0 36.6 31.2*  MCV 83.7  --  84.1 82.8  PLT 234  --  167 193   Basic Metabolic Panel: Recent Labs  Lab 08/27/23 1230 08/27/23 1240 08/28/23 1159 08/29/23 0609  NA 138 138 135 140  K 4.2 4.1 3.9 3.8  CL 99  --  103 109  CO2 23  --  21* 22  GLUCOSE 119*  --  123* 121*  BUN 13  --  12 16  CREATININE 0.69  --  0.57 0.61  CALCIUM 9.4  --  8.9 8.7*   Liver Function Tests: Recent Labs  Lab 08/27/23 1230  AST 19  ALT 19  ALKPHOS 59  BILITOT 0.5  PROT 7.2  ALBUMIN 3.6   CBG: No results for input(s): "GLUCAP" in the last 168 hours.  Discharge time spent: greater than 30 minutes.  This record has been created using Conservation officer, historic buildings. Errors have been sought and corrected,but may not always be located. Such creation errors do not reflect on the standard of care.   Signed: Luna Salinas, MD [facility] 08/29/2023

## 2023-09-01 LAB — CULTURE, BLOOD (ROUTINE X 2)
Culture: NO GROWTH
Culture: NO GROWTH
Special Requests: ADEQUATE

## 2024-05-17 DEATH — deceased
